# Patient Record
Sex: Male | Born: 1935 | Race: White | Hispanic: No | Smoking: Former smoker
Health system: Southern US, Community
[De-identification: ages and names within clinical notes are randomized; demographics above are authoritative.]

## PROBLEM LIST (undated history)

## (undated) DIAGNOSIS — L97519 Non-pressure chronic ulcer of other part of right foot with unspecified severity: Secondary | ICD-10-CM

## (undated) DIAGNOSIS — I10 Essential (primary) hypertension: Secondary | ICD-10-CM

## (undated) DIAGNOSIS — E119 Type 2 diabetes mellitus without complications: Secondary | ICD-10-CM

---

## 1965-12-29 HISTORY — PX: LEG AMPUTATION BELOW KNEE: SHX694

## 2009-01-26 ENCOUNTER — Ambulatory Visit: Payer: Self-pay

## 2010-05-09 ENCOUNTER — Ambulatory Visit: Payer: Self-pay | Admitting: Family Medicine

## 2012-10-18 ENCOUNTER — Ambulatory Visit: Payer: Self-pay | Admitting: Vascular Surgery

## 2012-10-18 LAB — BASIC METABOLIC PANEL
Anion Gap: 8 (ref 7–16)
BUN: 10 mg/dL (ref 7–18)
Chloride: 104 mmol/L (ref 98–107)
Creatinine: 0.86 mg/dL (ref 0.60–1.30)
EGFR (Non-African Amer.): >60
Glucose: 121 mg/dL — ABNORMAL HIGH (ref 65–99)
Osmolality: 282 (ref 275–301)
Potassium: 4 mmol/L (ref 3.5–5.1)

## 2013-07-04 ENCOUNTER — Ambulatory Visit: Payer: Self-pay | Admitting: Vascular Surgery

## 2013-07-04 LAB — BASIC METABOLIC PANEL
Anion Gap: 8 (ref 7–16)
Calcium, Total: 9 mg/dL (ref 8.5–10.1)
Osmolality: 277 (ref 275–301)
Potassium: 4 mmol/L (ref 3.5–5.1)
Sodium: 139 mmol/L (ref 136–145)

## 2014-07-28 ENCOUNTER — Encounter: Payer: Self-pay | Admitting: Surgery

## 2015-04-17 NOTE — Op Note (Signed)
PATIENT NAME:  Jim [name] MR#:  [id number] DATE OF BIRTH:  1930-08-29  DATE OF PROCEDURE:  10/18/2012  PREOPERATIVE DIAGNOSES:  1. Peripheral artery disease with ulceration of right lower extremity.  2. Long-standing diabetes mellitus with circulatory disorders.  3. Status post left below-knee amputation.   POSTOPERATIVE DIAGNOSES:  1. Peripheral artery disease with ulceration of right lower extremity.  2. Long-standing diabetes mellitus with circulatory disorders.  3. Status post left below-knee amputation.   PROCEDURES:  1. Ultrasound guidance for vascular access, left femoral artery.  2. Catheter placement into right posterior tibial artery from left femoral approach.  3. Aortogram and selective right lower extremity angiogram.  4. Percutaneous transluminal angioplasty to right SFA and above-knee popliteal artery with 5 mm diameter angioplasty balloon.  5. Self-expanding stent placement of right superficial femoral artery and above-knee popliteal artery with 6 mm diameter self-expanding stent for greater than 50% residual stenosis and dissection after angioplasty.  6. StarClose closure device, left femoral artery.   SURGEON: Annice Needy, M.D.   ANESTHESIA: Local with moderate conscious sedation.   ESTIMATED BLOOD LOSS: Minimal.   FLUOROSCOPY TIME: Approximately 17 minutes.  CONTRAST USED: 110 mL.   INDICATION FOR PROCEDURE: The patient is a 81 year old white male who was seen in the office last week with nonhealing ulcerations. He had no palpable pedal pulses and angiogram is performed for evaluation of circulation and potential for revascularization. Risks and benefits were discussed and informed consent was obtained.   DESCRIPTION OF PROCEDURE: The patient was brought to the vascular interventional radiology suite, groins were shaved and prepped and a sterile surgical field created. Due to poor anatomic landmarks, the left femoral artery was accessed under direct ultrasound  guidance without difficulty with a Seldinger needle. J-wire and 5 French sheath were placed. Pigtail catheter was placed in the aorta at the L1 level and AP aortogram was performed. This showed no flow-limiting stenosis in the aortoiliac segments and patent single renal arteries bilaterally. The takeoff of the left renal artery was quite low, several centimeters below the right.  I then hooked the aortic bifurcation and advanced to the right femoral head and selective right lower extremity angiogram was then performed. This showed an occlusion of the SFA in its proximal segments. This reconstituted a diseased distal SFA and above-knee popliteal artery. There was also a high-grade stenosis just above the knee joint, in the popliteal artery. Initially distal tibial opacification was poorly seen. I then gave 4000 units of intravenous heparin for systemic anticoagulation. A 6 French Ansel sheath was placed over a Terumo Advantage wire. I was able to cross the occlusion with a moderate amount of difficulty using initially a Terumo Advantage wire and a Kumpe catheter and later exchanging for an 0.018 Glidewire and a small CXI catheter. I confirmed intraluminal flow in the posterior tibial artery distally and then exchanged for a 300 length V18 wire. A 5 mm diameter x 22 centimeter in length angioplasty balloon was inflated from just below the knee to the proximal superficial femoral artery. Multiple areas of wastes were taken which resolved. Completion angiogram still showed high-grade stenosis and dissection in the mid SFA. We exchanged for the 0.035 wire and used a 6 mm diameter x 27 centimeter in length self-expanding stent, ironed out with a 6 mm balloon. Wastes were taken and completion angiogram now showed the SFA to be patent, the popliteal to be patent, and there was some mild residual stenosis in the most densely calcified portion of  the SFA, at the level of the stent, but this was not flow limiting and the  peroneal was the dominant runoff distally. The posterior tibial artery did occlude in the mid segment and reconstitute in the foot through peroneal collaterals. At this point, I elected to terminate the procedure. The sheath was          pulled back to the ipsilateral external iliac artery and oblique arteriogram was performed and StarClose closure device was deployed in the usual fashion with excellent hemostatic result. The patient tolerated the procedure well and was taken to the recovery room in stable condition.  ____________________________ Annice NeedyJason S. Dew, MD jsd:slb D: 10/18/2012 11:32:13 ET T: 10/18/2012 11:41:34 ET JOB#: [id number]  cc: Annice NeedyJason S. Dew, MD, <Dictator> [facility] Annice NeedyJASON S DEW MD ELECTRONICALLY SIGNED 10/18/2012 13:10

## 2015-04-20 NOTE — Op Note (Signed)
PATIENT NAME:  Jim [name], Jim [name] MR#:  [id number] DATE OF BIRTH:  Apr 14, 1930  DATE OF OPERATION:  07/04/2013  PREOPERATIVE DIAGNOSES: 1.  Peripheral arterial disease with ulceration, right lower extremity.  2.  Status post left lower extremity amputation.  3.  Diabetes.  4.  Hypertension.  5.  Hyperlipidemia.   POSTOPERATIVE DIAGNOSES:  1.  Peripheral arterial disease with ulceration, right lower extremity.  2.  Status post left lower extremity amputation.  3.  Diabetes.  4.  Hypertension.  5.  Hyperlipidemia.   PROCEDURES:  1.  Ultrasound guidance for vascular access, left femoral artery.  2.  Catheter placement to right popliteal artery from left femoral approach.  3.  Aortogram and selective right lower extremity angiogram.  4. Percutaneous transluminal angioplasty of right superficial femoral artery stent for recurrent stenosis with 5 mm and 6 mm diameter angioplasty balloon.  5.  Percutaneous transluminal angioplasty of right popliteal stenosis for separate and distinct lesion with 5 mm and 6 mm diameter angioplasty balloon.  6.  Self-expanding stent placement to right popliteal artery for greater than 50% residual stenosis after angioplasty.  7.  StarClose closure device, left femoral artery.   SURGEON:  Dew.   ANESTHESIA: Local, with moderate conscious sedation.   BLOOD LOSS: 25 mL.   INDICATION FOR PROCEDURE: An 81 year old white male with an extensive vascular history and multiple medical issues. He has a nonhealing ulceration of the right lower extremity. His noninvasive studies recently showed diminished perfusion with recurrent stenosis in his right SFA stent as well as a stenosis in the right popliteal artery that was separate. He was brought in for angiography, further evaluation and treatment. Risks and benefits were discussed. Informed consent was obtained.   DESCRIPTION OF PROCEDURE: The patient was brought to the vascular interventional radiology suite. Groins were  shaved and prepped and a sterile surgical field was created. Ultrasound was used to visualize a patent left femoral artery which was accessed under direct ultrasound guidance without difficulty with a Seldinger needle. A J-wire and 5 French sheath were placed. Pigtail catheter was placed in the aorta at the L1 level. AP aortogram was performed. This showed patent renal arteries bilaterally, with the left being much lower than the right. I then took care of the bifurcation and advanced to the right femoral head, and selective right lower extremity angiogram was then performed. This demonstrated a patent common femoral artery, profunda femoris artery. The superficial femoral artery was patent proximally. There was a right SFA stent that had a moderate to high-grade stenosis in the 70% to 75% range over several centimeters in the mid- to distal portions. From Hunter's canal to the popliteal artery just above the knee was patent, there was a near-occlusive stenosis of 95% or more in the popliteal artery just above the knee. The below-knee popliteal artery was patent, without significant stenosis, and there was 1-vessel runoff distally through the peroneal artery. I then placed a Terumo Advantage wire through a 6-French Ansell sheath, heparinized the patient.  I was able to cross these lesion without difficulty with the Terumo Advantage wire and a Kumpe catheter and confirm intraluminal flow to the below-knee popliteal artery.   I then replaced the wire. Balloon angioplasty was performed initially with a 5 mm diameter angioplasty balloon to the popliteal lesion and then a 5 mm angioplasty balloon to the SFA stents for separate and distinct lesions. There was still residual stenosis after both treatments. I then upsized to a 6 mm diameter angioplasty  balloon initially in both the popliteal artery and in the SFA, again separate and distinct and waists were taken. Following this, the SFA stent was now patent, with less  than 20% residual stenosis, however the popliteal artery still had a 70% stenosis residual, and I elected to place a small self-expanding stent a 6 mm in diameter x 4 cm self-expanding stent was placed just above the knee in the popliteal artery, and was postdilated. Completion angiogram following this showed this now to be widely patent, without significant residual stenosis.   At this point I elected to terminate the procedure. The sheath was pulled back to the ipsilateral external iliac artery and oblique arteriogram was performed. StarClose closure device was deployed in the usual fashion with excellent hemostatic result. The patient tolerated the procedure well and was taken to the recovery room in stable condition.    ____________________________ Annice NeedyJason S. Dew, MD jsd:dm D: 07/04/2013 09:10:03 ET T: 07/04/2013 09:34:50 ET JOB#: [id number]  cc: Annice NeedyJason S. Dew, MD, <Dictator> Annice NeedyJASON S DEW MD ELECTRONICALLY SIGNED 07/04/2013 13:20

## 2015-08-30 ENCOUNTER — Encounter: Payer: Medicare HMO | Attending: Surgery | Admitting: Surgery

## 2015-08-30 DIAGNOSIS — I10 Essential (primary) hypertension: Secondary | ICD-10-CM | POA: Diagnosis not present

## 2015-08-30 DIAGNOSIS — M1990 Unspecified osteoarthritis, unspecified site: Secondary | ICD-10-CM | POA: Diagnosis not present

## 2015-08-30 DIAGNOSIS — I7025 Atherosclerosis of native arteries of other extremities with ulceration: Secondary | ICD-10-CM | POA: Diagnosis not present

## 2015-08-30 DIAGNOSIS — Z89512 Acquired absence of left leg below knee: Secondary | ICD-10-CM | POA: Insufficient documentation

## 2015-08-30 DIAGNOSIS — L97512 Non-pressure chronic ulcer of other part of right foot with fat layer exposed: Secondary | ICD-10-CM | POA: Diagnosis not present

## 2015-08-30 DIAGNOSIS — E11621 Type 2 diabetes mellitus with foot ulcer: Secondary | ICD-10-CM | POA: Diagnosis not present

## 2015-08-30 DIAGNOSIS — Z87891 Personal history of nicotine dependence: Secondary | ICD-10-CM | POA: Insufficient documentation

## 2015-08-30 DIAGNOSIS — Z794 Long term (current) use of insulin: Secondary | ICD-10-CM | POA: Insufficient documentation

## 2015-08-30 NOTE — Progress Notes (Signed)
Lafever, Westlee L. ([id number]) Visit Report for 08/30/2015 Abuse/Suicide Risk Screen Details Patient Name: Jim [name], [name] Date of Service: 08/30/2015 1:45 PM Medical Record Number: [id number] Patient Account Number: [id number] Date of Birth/Sex: May 25, 1930 (81 y.o. Male) Treating RN: Clover Mealy, RN, BSN, [place] S[place] Primary Care Physician: Darreld Mclean Other Clinician: Referring Physician: Darreld Mclean Treating Physician/Extender: Rudene Re in Treatment: 0 Abuse/Suicide Risk Screen Items Answer ABUSE/SUICIDE RISK SCREEN: Has anyone close to you tried to hurt or harm you recentlyo No Do you feel uncomfortable with anyone in your familyo No Has anyone forced you do things that you didnot want to doo No Do you have any thoughts of harming yourselfo No Patient displays signs or symptoms of abuse and/or neglect. No Electronic Signature(s) Signed: 08/30/2015 2:07:10 PM By: Elpidio Eric BSN, RN Entered By: Elpidio Eric on 08/30/2015 14:07:10 Jim [name], [name] ([id number]) -------------------------------------------------------------------------------- Activities of Daily Living Details Patient Name: [name], [name] Date of Service: 08/30/2015 1:45 PM Medical Record Number: [id number] Patient Account Number: [id number] Date of Birth/Sex: 1930/05/17 (81 y.o. Male) Treating RN: Clover Mealy, RN, BSN, [place] Sink Primary Care Physician: Darreld Mclean Other Clinician: Referring Physician: Darreld Mclean Treating Physician/Extender: Rudene Re in Treatment: 0 Activities of Daily Living Items Answer Activities of Daily Living (Please select one for each item) Drive Automobile Not Able Take Medications Need Assistance Use Telephone Completely Able Care for Appearance Need Assistance Use Toilet Need Assistance Bath / Shower Need Assistance Dress Self Need Assistance Feed Self Completely Able Walk Need Assistance Get In / Out Bed Need Assistance Housework Need Assistance Prepare Meals Need Assistance Handle  Money Need Assistance Shop for Self Need Assistance Electronic Signature(s) Signed: 08/30/2015 2:06:59 PM By: Elpidio Eric BSN, RN Entered By: Elpidio Eric on 08/30/2015 14:06:58 Jim [name], [name] ([id number]) -------------------------------------------------------------------------------- Education Assessment Details Patient Name: Jim [name]. Date of Service: 08/30/2015 1:45 PM Medical Record Number: [id number] Patient Account Number: [id number] Date of Birth/Sex: [birth year] (81 y.o. Male) Treating RN: Clover Mealy, RN, BSN, [place] Sink Primary Care Physician: Darreld Mclean Other Clinician: Referring Physician: Darreld Mclean Treating Physician/Extender: Rudene Re in Treatment: 0 Primary Learner Assessed: Patient Learning Preferences/Education Level/Primary Language Learning Preference: Explanation Highest Education Level: Grade School Preferred Language: English Cognitive Barrier Assessment/Beliefs Language Barrier: No Physical Barrier Assessment Impaired Vision: Yes Glasses Impaired Hearing: No Decreased Hand dexterity: No Knowledge/Comprehension Assessment Knowledge Level: High Comprehension Level: High Ability to understand written High instructions: Motivation Assessment Anxiety Level: Calm Cooperation: Cooperative Education Importance: Acknowledges Need Interest in Health Problems: Asks Questions Perception: Coherent Willingness to Engage in Self- Medium Management Activities: Readiness to Engage in Self- Medium Management Activities: Electronic Signature(s) Signed: 08/30/2015 2:06:23 PM By: Elpidio Eric BSN, RN Entered By: Elpidio Eric on 08/30/2015 14:06:23 Jim [name], [name] ([id number]) -------------------------------------------------------------------------------- Fall Risk Assessment Details Patient Name: Jim [name]. Date of Service: 08/30/2015 1:45 PM Medical Record Number: [id number] Patient Account Number: [id number] Date of Birth/Sex: [birth year] (81 y.o.  Male) Treating RN: Clover Mealy, RN, BSN, [place] Sink Primary Care Physician: Darreld Mclean Other Clinician: Referring Physician: Darreld Mclean Treating Physician/Extender: Rudene Re in Treatment: 0 Fall Risk Assessment Items FALL RISK ASSESSMENT: History of falling - immediate or within 3 months 0 No Secondary diagnosis 0 No Ambulatory aid None/bed rest/wheelchair/nurse 0 Yes Crutches/cane/walker 15 Yes Furniture 0 No IV Access/Saline Lock 0 No Gait/Training Normal/bed rest/immobile 0 No Weak 0 No Impaired 20 Yes Mental Status Oriented to own ability 0 Yes Electronic Signature(s) Signed: 08/30/2015 2:05:54 PM By: Elpidio Eric BSN, RN Entered By: Elpidio Eric on  08/30/2015 14:05:54 Jim [name], [name] ([id number]) -------------------------------------------------------------------------------- Foot Assessment Details Patient Name: Jim [name], [name] Date of Service: 08/30/2015 1:45 PM Medical Record Number: [id number] Patient Account Number: [id number] Date of Birth/Sex: [birth year] (81 y.o. Male) Treating RN: Clover Mealy, RN, BSN, [place] Primary Care Physician: Darreld Mclean Other Clinician: Referring Physician: Darreld Mclean Treating Physician/Extender: Rudene Re in Treatment: 0 Foot Assessment Items Site Locations + = Sensation present, - = Sensation absent, C = Callus, U = Ulcer R = Redness, W = Warmth, M = Maceration, PU = Pre-ulcerative lesion F = Fissure, S = Swelling, D = Dryness Assessment Right: Left: Other Deformity: No No Prior Foot Ulcer: No No Prior Amputation: No No Charcot Joint: No No Ambulatory Status: Ambulatory With Help Assistance Device: Wheelchair Gait: Surveyor, mining) Signed: 08/30/2015 2:05:10 PM By: Elpidio Eric BSN, RN Entered By: Elpidio Eric on 08/30/2015 14:05:10 Jim [name], [name] ([id number]) -------------------------------------------------------------------------------- Nutrition Risk Assessment Details Patient Name: Jim [name], [name] Date  of Service: 08/30/2015 1:45 PM Medical Record Number: [id number] Patient Account Number: [id number] Date of Birth/Sex: July 29, 1930 (81 y.o. Male) Treating RN: Clover Mealy, RN, BSN, [place] Primary Care Physician: Darreld Mclean Other Clinician: Referring Physician: Darreld Mclean Treating Physician/Extender: Rudene Re in Treatment: 0 Height (in): 73 Weight (lbs): 222 Body Mass Index (BMI): 29.3 Nutrition Risk Assessment Items NUTRITION RISK SCREEN: I have an illness or condition that made me change the kind and/or 0 No amount of food I eat I eat fewer than two meals per day 0 No I eat few fruits and vegetables, or milk products 0 No I have three or more drinks of beer, liquor or wine almost every day 0 No I have tooth or mouth problems that make it hard for me to eat 0 No I don't always have enough money to buy the food I need 0 No I eat alone most of the time 0 No I take three or more different prescribed or over-the-counter drugs a 0 No day Without wanting to, I have lost or gained 10 pounds in the last six 0 No months I am not always physically able to shop, cook and/or feed myself 2 Yes Nutrition Protocols Good Risk Protocol 0 No interventions needed Moderate Risk Protocol Electronic Signature(s) Signed: 08/30/2015 2:05:33 PM By: Elpidio Eric BSN, RN Entered By: Elpidio Eric on 08/30/2015 14:05:32

## 2015-08-30 NOTE — Progress Notes (Addendum)
Hopes, [name] ([id number]) Visit Report for 08/30/2015 Chief Complaint Document Details Patient Name: Jim [name], [name] Date of Service: 08/30/2015 1:45 PM Medical Record Number: [id number] Patient Account Number: [id number] Date of Birth/Sex: Jun 28, 1930 (81 y.o. Male) Treating RN: Clover Mealy, RN, BSN, [place] Sink Primary Care Physician: Darreld Mclean Other Clinician: Referring Physician: Darreld Mclean Treating Physician/Extender: Jim [name] in Treatment: 0 Information Obtained from: Patient Chief Complaint Patients presents for treatment of an open diabetic ulcer. The patient noticed some blood on his sock and noticed that he had a ulcerated area on his right forefoot and his right fourth toe. This was about 3 weeks ago. Electronic Signature(s) Signed: 08/30/2015 3:07:47 PM By: Evlyn Kanner MD, FACS Entered By: Evlyn Kanner on 08/30/2015 15:07:46 Jim [name], [name] ([id number]) -------------------------------------------------------------------------------- Debridement Details Patient Name: Jim [name], [name] Date of Service: 08/30/2015 1:45 PM Medical Record Number: [id number] Patient Account Number: [id number] Date of Birth/Sex: 03-03-1930 (81 y.o. Male) Treating RN: Afful, RN, BSN, Rita Primary Care Physician: Darreld Mclean Other Clinician: Referring Physician: Darreld Mclean Treating Physician/Extender: Jim [name] in Treatment: 0 Debridement Performed for Wound #2 Right,Lateral,Plantar Foot Assessment: Performed By: Physician Tristan Schroeder., MD Debridement: Debridement Pre-procedure Yes Verification/Time Out Taken: Start Time: 14:44 Pain Control: Lidocaine 4% Topical Solution Level: Skin/Subcutaneous Tissue Total Area Debrided (L x 1 (cm) x 1.2 (cm) = 1.2 (cm) W): Tissue and other Viable, Non-Viable, Exudate, Fibrin/Slough, Subcutaneous material debrided: Instrument: Curette Bleeding: Minimum Hemostasis Achieved: Pressure End Time: 14:46 Procedural Pain: 0 Post  Procedural Pain: 0 Response to Treatment: Procedure was tolerated well Post Debridement Measurements of Total Wound Length: (cm) 1 Width: (cm) 1.2 Depth: (cm) 0.2 Volume: (cm) 0.188 Post Procedure Diagnosis Same as Pre-procedure Electronic Signature(s) Signed: 08/30/2015 3:06:31 PM By: Evlyn Kanner MD, FACS Signed: 08/31/2015 9:31:14 AM By: Elpidio Eric BSN, RN Previous Signature: 08/30/2015 2:47:15 PM Version By: Elpidio Eric BSN, RN Entered By: Evlyn Kanner on 08/30/2015 15:06:31 Deridder, Shown L. ([id number]) -------------------------------------------------------------------------------- HPI Details Patient Name: Jim [name], [name] Date of Service: 08/30/2015 1:45 PM Medical Record Number: [id number] Patient Account Number: [id number] Date of Birth/Sex: 01-28-1930 (81 y.o. Male) Treating RN: Clover Mealy, RN, BSN, Rita Primary Care Physician: Darreld Mclean Other Clinician: Referring Physician: Darreld Mclean Treating Physician/Extender: Jim [name] in Treatment: 0 History of Present Illness Location: ulcerated area on the right foot plantar aspect and his right fourth toe Quality: Patient reports No Pain. Severity: Patient states wound are getting worse. Duration: Patient has had the wound for < 3 weeks prior to presenting for treatment Context: The wound appeared gradually over time Modifying Factors: Other treatment(s) tried include:the patient has been put on oral Levaquin for 10 days Associated Signs and Symptoms: Patient reports having increase swelling. HPI Description: The patient is a pleasant 81 year old with a past medical history significant for type 2 diabetes and peripheral vascular disease. He underwent a left below-the-knee amputation approximately 10 years ago after stepping on a nail. He has done well since that time and is able to ambulate with a prosthetic. he has had a vascular procedure on his right leg about 4-5 years ago and has never seen the doctor again. His  diabetes is under control and his last hemoglobin A1c was 7.7% measured in January 2016. His other comorbidities are hypertension, cellulitis of the foot, recent urinary tract infection. He was seen earlier for a wound problem about a year ago on his BKA stump. He has been referred back to the wound center by his PCP Dr. Darreld Mclean for  a moderately severe cellulitis of the right foot and right fourth toenail in a gentleman who is known to have PVD and is status post left BKA and has insulin-dependent diabetes mellitus. Electronic Signature(s) Signed: 08/30/2015 3:10:57 PM By: Evlyn Kanner MD, FACS Previous Signature: 08/30/2015 1:56:47 PM Version By: Evlyn Kanner MD, FACS Entered By: Evlyn Kanner on 08/30/2015 15:10:56 Jim [name], [name] ([id number]) -------------------------------------------------------------------------------- Callus Pairing Details Patient Name: Jim [name], [name] Date of Service: 08/30/2015 1:45 PM Medical Record Number: [id number] Patient Account Number: [id number] Date of Birth/Sex: 06/26/1930 (81 y.o. Male) Treating RN: Clover Mealy, RN, BSN, [place] Sink Primary Care Physician: Darreld Mclean Other Clinician: Referring Physician: Darreld Mclean Treating Physician/Extender: Jim [name] in Treatment: 0 Procedure Performed for: Wound #2 Right,Lateral,Plantar Foot Performed By: Physician Tristan Schroeder., MD Post Procedure Diagnosis Same as Pre-procedure Notes He had a significant callus surrounding the ulceration and a lot of dead skin which was sharply debrided with forceps and scissors. Electronic Signature(s) Signed: 08/30/2015 3:06:59 PM By: Evlyn Kanner MD, FACS Previous Signature: 08/30/2015 2:47:31 PM Version By: Elpidio Eric BSN, RN Entered By: Evlyn Kanner on 08/30/2015 15:06:59 Jim [name], [name] ([id number]) -------------------------------------------------------------------------------- Physical Exam Details Patient Name: Jim [name], [name] Date of Service: 08/30/2015 1:45  PM Medical Record Number: [id number] Patient Account Number: [id number] Date of Birth/Sex: Aug 14, 1930 (81 y.o. Male) Treating RN: Clover Mealy, RN, BSN, [place] Sink Primary Care Physician: Darreld Mclean Other Clinician: Referring Physician: Darreld Mclean Treating Physician/Extender: Jim [name] in Treatment: 0 Constitutional . Pulse regular. Respirations normal and unlabored. Afebrile. . Eyes Nonicteric. Reactive to light. Ears, Nose, Mouth, and Throat Lips, teeth, and gums WNL.Marland Kitchen Moist mucosa without lesions . Neck supple and nontender. No palpable supraclavicular or cervical adenopathy. Normal sized without goiter. Respiratory WNL. No retractions.. Cardiovascular Heart rhythm and rate regular, no murmur or gallop.. the pulses on his right foot were barely palpable and his ABI was 0.76 and monophasic.Marland Kitchen No clubbing, cyanosis or edema. Gastrointestinal (GI) Abdomen without masses or tenderness.. No liver or spleen enlargement or tenderness.. Lymphatic No adneopathy. No adenopathy. No adenopathy. Musculoskeletal Adexa without tenderness or enlargement.. Digits and nails w/o clubbing, cyanosis, infection, petechiae, ischemia, or inflammatory conditions.. Integumentary (Hair, Skin) No suspicious lesions. No crepitus or fluctuance. No peri-wound warmth or erythema. No masses.Marland Kitchen Psychiatric Judgement and insight Intact.. No evidence of depression, anxiety, or agitation.. Notes The right foot has a lot of dry scaly skin and the dorsum of his right fourth toe has a mild ulceration after an avulsion of the nail. The lateral part of his right forefoot has a significant callus with a lot of dead skin and a ulcerated area which needs sharp debridement with forceps scissors and a curette. Electronic Signature(s) Signed: 08/30/2015 3:12:54 PM By: Evlyn Kanner MD, FACS Entered By: Evlyn Kanner on 08/30/2015 15:12:54 Jim [name], Terrill L.  ([id number]) -------------------------------------------------------------------------------- Physician Orders Details Patient Name: Jim Pilgrim. Date of Service: 08/30/2015 1:45 PM Medical Record Number: [id number] Patient Account Number: [id number] Date of Birth/Sex: 06/28/1930 (82 y.o. 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 Wound #3 Right,Dorsal Toe  Fourth o Clean wound with Normal Saline. Anesthetic Wound #2 Right,Lateral,Plantar Foot o Topical Lidocaine 4% cream applied to wound bed prior to debridement Wound #3 Right,Dorsal Toe Fourth o Topical Lidocaine 4% cream applied to wound bed prior to debridement Primary Wound Dressing Wound #2 Right,Lateral,Plantar Foot o Aquacel Ag Wound #3 Right,Dorsal Toe Fourth o Aquacel Ag Secondary Dressing Wound #2 Right,Lateral,Plantar Foot o Gauze and Kerlix/Conform o Foam Wound #3 Right,Dorsal Toe Fourth o Gauze and Kerlix/Conform o Foam Jim [name], [name] ([id number]) Dressing Change Frequency Wound #2 Right,Lateral,Plantar Foot o Change dressing every other day. Wound #3 Right,Dorsal Toe Fourth o Change dressing every other day. Follow-up Appointments Wound #2 Right,Lateral,Plantar Foot o Return Appointment in 1 week. Wound #3 Right,Dorsal Toe Fourth o Return Appointment in 1 week. Off-Loading Wound #2 Right,Lateral,Plantar Foot o Open toe surgical  shoe with peg assist. - darco o Other: - felt Wound #3 Right,Dorsal Toe Fourth o Open toe surgical shoe with peg assist. - darco o Other: - felt Additional Orders / Instructions Wound #2 Right,Lateral,Plantar Foot o Increase protein intake. o Activity as tolerated Wound #3 Right,Dorsal Toe Fourth o Increase protein intake. o Activity as tolerated Notes Patient to make appointment with vascular for follow up. Electronic Signature(s) Signed: 08/30/2015 2:51:22 PM By: Elpidio Eric BSN, RN Signed: 08/30/2015 4:57:18 PM By: Evlyn Kanner MD, FACS Entered By: Elpidio Eric on 08/30/2015 14:51:22 Jim [name], [name] ([id number]) -------------------------------------------------------------------------------- Problem List Details Patient Name: Jim [name], Philopateer L. Date of Service: 08/30/2015 1:45 PM Medical Record Number: [id number] Patient Account Number: [id number] Date of Birth/Sex: 1930/12/28 (81 y.o. Male) Treating RN: Clover Mealy, RN, BSN, [place] Sink Primary Care Physician: Darreld Mclean Other Clinician: Referring Physician: Darreld Mclean Treating Physician/Extender: Jim [name] in Treatment: 0 Active Problems ICD-10 Encounter Code Description Active Date Diagnosis E11.621 Type 2 diabetes mellitus with foot ulcer 08/30/2015 Yes Z89.512 Acquired absence of left leg below knee 08/30/2015 Yes I70.25 Atherosclerosis of native arteries of other extremities with 08/30/2015 Yes ulceration L97.512 Non-pressure chronic ulcer of other part of right foot with 08/30/2015 Yes fat layer exposed Inactive Problems Resolved Problems Electronic Signature(s) Signed: 08/30/2015 3:06:14 PM By: Evlyn Kanner MD, FACS Previous Signature: 08/30/2015 2:19:38 PM Version By: Evlyn Kanner MD, FACS Entered By: Evlyn Kanner on 08/30/2015 15:06:13 Jim [name], [name] ([id number]) -------------------------------------------------------------------------------- Progress Note/History and Physical Details Patient Name: Jim Pilgrim. Date of Service: 08/30/2015 1:45 PM Medical Record Number: [id number] Patient Account Number: [id number] Date of Birth/Sex: 1930/07/30 (81 y.o. Male) Treating RN: Clover Mealy, RN, BSN, Rita Primary Care Physician: Darreld Mclean Other Clinician: Referring Physician: Darreld Mclean Treating Physician/Extender: Jim [name] in Treatment: 0 Subjective Chief Complaint Information obtained from Patient Patients presents for treatment of an open diabetic ulcer. The patient noticed some blood on his sock and noticed that he had a ulcerated area on his right forefoot and his right fourth toe. This was about 3 weeks ago. History of Present Illness (HPI) The following HPI elements were documented for the patient's wound: Location: ulcerated area on the right foot plantar aspect and his right fourth toe Quality: Patient reports No Pain. Severity: Patient states wound are getting worse. Duration: Patient has had the wound for < 3 weeks prior to presenting for treatment Context: The wound appeared gradually over time Modifying Factors: Other treatment(s) tried include:the patient has been put on oral Levaquin for 10 days Associated Signs and Symptoms: Patient reports having increase swelling. The patient is a pleasant 81 year old with a past medical history significant for type 2 diabetes  and peripheral vascular disease. He underwent a left below-the-knee amputation approximately 10 years ago after stepping on a nail. He has done well since that time and is able to ambulate with a prosthetic. he has had a vascular procedure on his right leg about 4-5 years ago and has never seen the doctor again. His diabetes is under control and his last hemoglobin A1c was 7.7% measured in January 2016. His other comorbidities are hypertension, cellulitis of the foot, recent urinary tract infection. He was seen earlier for a wound problem about a year ago on his BKA stump. He has been referred back to the  wound center by his PCP Dr. Darreld Mclean for a moderately severe cellulitis of the right foot and right fourth toenail in a gentleman who is known to have PVD and is status post left BKA and has insulin-dependent diabetes mellitus. Wound History Patient presents with 2 open wounds that have been present for approximately 3weeks. Patient has been treating wounds in the following manner: dry dressing. Laboratory tests have not been performed in the last month. Patient reportedly has not tested positive for an antibiotic resistant organism. Patient reportedly has not tested positive for osteomyelitis. Patient reportedly has not had testing performed to evaluate circulation in the legs. Patient experiences the following problems associated with their wounds: infection. Patient History Information obtained from Patient. Barsanti, Marlo L. ([id number]) Allergies No Known Drug Allergies Family History Diabetes - Mother, No family history of Cancer, Heart Disease, Hypertension, Kidney Disease, Lung Disease, Seizures, Stroke, Thyroid Problems. Social History Former smoker - chewed tobacco, quit 59yrs ago, Marital Status - Married, Alcohol Use - Never, Drug Use - No History, Caffeine Use - Daily. Medical History Eyes Patient has history of Cataracts - removed Ear/Nose/Mouth/Throat Denies history of Chronic sinus problems/congestion, Middle ear problems Hematologic/Lymphatic Denies history of Anemia, Hemophilia, Human Immunodeficiency Virus, Lymphedema, Sickle Cell Disease Respiratory Denies history of Aspiration, Asthma, Chronic Obstructive Pulmonary Disease (COPD), Pneumothorax, Sleep Apnea, Tuberculosis Cardiovascular Patient has history of Hypertension Denies history of Angina, Arrhythmia, Congestive Heart Failure, Coronary Artery Disease, Deep Vein Thrombosis, Hypotension, Myocardial Infarction, Peripheral Arterial Disease, Peripheral Venous Disease, Phlebitis,  Vasculitis Gastrointestinal Denies history of Cirrhosis Endocrine Patient has history of Type II Diabetes Genitourinary Denies history of End Stage Renal Disease Immunological Denies history of Lupus Erythematosus, Raynaud s, Scleroderma Integumentary (Skin) Denies history of History of Burn, History of pressure wounds Musculoskeletal Patient has history of Osteoarthritis Denies history of Gout, Rheumatoid Arthritis Neurologic Denies history of Neuropathy Oncologic Denies history of Received Chemotherapy, Received Radiation Patient is treated with Insulin. Blood sugar is tested. Blood sugar results noted at the following times: Breakfast - 150, Bedtime - 180. Medical And Surgical History Notes Constitutional Symptoms (General Health) Childrey, Ovide L. ([id number]) HTN, Diabetes, Left BKA 2000, Dry Skin Review of Systems (ROS) Eyes Complains or has symptoms of Glasses / Contacts. Ear/Nose/Mouth/Throat The patient has no complaints or symptoms. Hematologic/Lymphatic The patient has no complaints or symptoms. Respiratory The patient has no complaints or symptoms. Cardiovascular The patient has no complaints or symptoms. Gastrointestinal The patient has no complaints or symptoms. Genitourinary The patient has no complaints or symptoms. Immunological The patient has no complaints or symptoms. Integumentary (Skin) Complains or has symptoms of Wounds, Swelling. Musculoskeletal The patient has no complaints or symptoms, left BKA Neurologic The patient has no complaints or symptoms. Medications aspirin oral unspecified oral oxycodone 30 mg tablet oral tablet oral for as needed simvastatin 40 mg tablet oral 1 1 tablet oral  levofloxacin 500 mg tablet oral 1 1 tablet oral the medication should also include Humulin insulin, Univasc, pro-air HFA, gabapentin. Objective Constitutional Pulse regular. Respirations normal and unlabored. Afebrile. Vitals Time Taken: 2:03 PM, Height:  73 in, Source: Stated, Weight: 222 lbs, Source: Stated, BMI: 29.3, Temperature: 98.4 F, Pulse: 88 bpm, Respiratory Rate: 16 breaths/min, Blood Pressure: 152/67 mmHg. Wahlstrom, Rinaldo L. ([id number]) Eyes Nonicteric. Reactive to light. Ears, Nose, Mouth, and Throat Lips, teeth, and gums WNL.Marland Kitchen Moist mucosa without lesions . Neck supple and nontender. No palpable supraclavicular or cervical adenopathy. Normal sized without goiter. Respiratory WNL. No retractions.. Cardiovascular Heart rhythm and rate regular, no murmur or gallop.. the pulses on his right foot were barely palpable and his ABI was 0.76 and monophasic.Marland Kitchen No clubbing, cyanosis or edema. Gastrointestinal (GI) Abdomen without masses or tenderness.. No liver or spleen enlargement or tenderness.. Lymphatic No adneopathy. No adenopathy. No adenopathy. Musculoskeletal Adexa without tenderness or enlargement.. Digits and nails w/o clubbing, cyanosis, infection, petechiae, ischemia, or inflammatory conditions.Marland Kitchen Psychiatric Judgement and insight Intact.. No evidence of depression, anxiety, or agitation.. General Notes: The right foot has a lot of dry scaly skin and the dorsum of his right fourth toe has a mild ulceration after an avulsion of the nail. The lateral part of his right forefoot has a significant callus with a lot of dead skin and a ulcerated area which needs sharp debridement with forceps scissors and a curette. Integumentary (Hair, Skin) No suspicious lesions. No crepitus or fluctuance. No peri-wound warmth or erythema. No masses.. Wound #2 status is Open. Original cause of wound was Footwear Injury. The wound is located on the Right,Lateral,Plantar Foot. The wound measures 1cm length x 1.2cm width x 0.2cm depth; 0.942cm^2 area and 0.188cm^3 volume. The wound is limited to skin breakdown. There is no tunneling or undermining noted. There is a small amount of serosanguineous drainage noted. The wound margin is thickened.  There is medium (34-66%) pink, pale granulation within the wound bed. There is a medium (34-66%) amount of necrotic tissue within the wound bed including Adherent Slough. The periwound skin appearance exhibited: Callus, Moist. The periwound skin appearance did not exhibit: Crepitus, Excoriation, Fluctuance, Friable, Induration, Localized Edema, Rash, Scarring, Dry/Scaly, Maceration, Atrophie Blanche, Cyanosis, Ecchymosis, Hemosiderin Staining, Mottled, Pallor, Rubor, Erythema. Periwound temperature was noted as No Abnormality. The periwound has tenderness on palpation. Wound #3 status is Open. Original cause of wound was Shear/Friction. The wound is located on the Right,Dorsal Toe Fourth. The wound measures 1cm length x 1cm width x 0.1cm depth; 0.785cm^2 area Wisniewski, Fidel L. ([id number]) and 0.079cm^3 volume. The wound is limited to skin breakdown. There is no tunneling or undermining noted. There is a small amount of serous drainage noted. The wound margin is distinct with the outline attached to the wound base. There is large (67-100%) red, pink granulation within the wound bed. There is no necrotic tissue within the wound bed. The periwound skin appearance exhibited: Moist. The periwound skin appearance did not exhibit: Callus, Crepitus, Excoriation, Fluctuance, Friable, Induration, Localized Edema, Rash, Scarring, Dry/Scaly, Maceration, Atrophie Blanche, Cyanosis, Ecchymosis, Hemosiderin Staining, Mottled, Pallor, Rubor, Erythema. Periwound temperature was noted as No Abnormality. Assessment Active Problems ICD-10 E11.621 - Type 2 diabetes mellitus with foot ulcer Z89.512 - Acquired absence of left leg below knee I70.25 - Atherosclerosis of native arteries of other extremities with ulceration L97.512 - Non-pressure chronic ulcer of other part of right foot with fat layer exposed This 81 year old gentleman who has diabetes mellitus type 2 with a  DFU on the right plantar aspect of  his forefoot has a Wagner stage II ulceration. He also has a significant history of atherosclerotic disease of the right lower extremity and has not had a vascular workup for several years after his angioplasty. I have recommended he sees the vascular surgeons for a review and possible arterial duplex study. I have recommended silver alginate and a felt offloading ring with a border foam dressing and have recommended a Pegasus shoe because of the fact that he's got a prosthesis on the other leg and will not be able to tolerate a Darco offloading shoe. We have discussed foot hygiene and dressing changes appropriately and close monitoring of his blood glucose. He is also urged to follow-up with his vascular doctor and see as back on a regular basis weekly. Procedures Wound #2 Wound #2 is a Diabetic Wound/Ulcer of the Lower Extremity located on the Right,Lateral,Plantar Foot . There was a Skin/Subcutaneous Tissue Debridement (40981-19147) debridement with total area of 1.2 sq cm performed by Tristan Schroeder., MD. with the following instrument(s): Curette to remove Viable and Non- Viable tissue/material including Exudate, Fibrin/Slough, and Subcutaneous after achieving pain control using Lidocaine 4% Topical Solution. A time out was conducted prior to the start of the procedure. A Minimum amount of bleeding was controlled with Pressure. The procedure was tolerated well with a pain level of 0 throughout and a pain level of 0 following the procedure. Post Debridement Measurements: 1cm length x 1.2cm width x 0.2cm depth; 0.188cm^3 volume. Post procedure Diagnosis Wound #2: Same as Pre-Procedure Hartfield, Halton L. ([id number]) Wound #2 is a Diabetic Wound/Ulcer of the Lower Extremity located on the Right,Lateral,Plantar Foot . An Callus Pairing procedure was performed by Tristan Schroeder., MD. Post procedure Diagnosis Wound #2: Same as Pre-Procedure Notes: He had a significant callus surrounding the  ulceration and a lot of dead skin which was sharply debrided with forceps and scissors. Plan Wound Cleansing: Wound #2 Right,Lateral,Plantar Foot: Clean wound with Normal Saline. Wound #3 Right,Dorsal Toe Fourth: Clean wound with Normal Saline. Anesthetic: Wound #2 Right,Lateral,Plantar Foot: Topical Lidocaine 4% cream applied to wound bed prior to debridement Wound #3 Right,Dorsal Toe Fourth: Topical Lidocaine 4% cream applied to wound bed prior to debridement Primary Wound Dressing: Wound #2 Right,Lateral,Plantar Foot: Aquacel Ag Wound #3 Right,Dorsal Toe Fourth: Aquacel Ag Secondary Dressing: Wound #2 Right,Lateral,Plantar Foot: Gauze and Kerlix/Conform Foam Wound #3 Right,Dorsal Toe Fourth: Gauze and Kerlix/Conform Foam Dressing Change Frequency: Wound #2 Right,Lateral,Plantar Foot: Change dressing every other day. Wound #3 Right,Dorsal Toe Fourth: Change dressing every other day. Follow-up Appointments: Wound #2 Right,Lateral,Plantar Foot: Return Appointment in 1 week. Wound #3 Right,Dorsal Toe Fourth: Return Appointment in 1 week. Off-Loading: Wound #2 Right,Lateral,Plantar Foot: Open toe surgical shoe with peg assist. - darco Other: - felt Pollack, Grabiel L. ([id number]) Wound #3 Right,Dorsal Toe Fourth: Open toe surgical shoe with peg assist. - darco Other: - felt Additional Orders / Instructions: Wound #2 Right,Lateral,Plantar Foot: Increase protein intake. Activity as tolerated Wound #3 Right,Dorsal Toe Fourth: Increase protein intake. Activity as tolerated General Notes: Patient to make appointment with vascular for follow up. This 81 year old gentleman who has diabetes mellitus type 2 with a DFU on the right plantar aspect of his forefoot has a Wagner stage II ulceration. He also has a significant history of atherosclerotic disease of the right lower extremity and has not had a vascular workup for several years after his angioplasty. I have recommended he  sees the vascular surgeons for  a review and possible arterial duplex study. I have recommended silver alginate and a felt offloading ring with a border foam dressing and have recommended a Pegasus shoe because of the fact that he's got a prosthesis on the other leg and will not be able to tolerate a Darco offloading shoe. We have discussed foot hygiene and dressing changes appropriately and close monitoring of his blood glucose. He is also urged to follow-up with his vascular doctor and see as back on a regular basis weekly. Electronic Signature(s) Signed: 08/30/2015 3:17:00 PM By: Evlyn Kanner MD, FACS Entered By: Evlyn Kanner on 08/30/2015 15:17:00 Kaseman, Eino L. ([id number]) -------------------------------------------------------------------------------- ROS/PFSH Details Patient Name: Jim [name], Taray L. Date of Service: 08/30/2015 1:45 PM Medical Record Number: [id number] Patient Account Number: [id number] Date of Birth/Sex: Aug 29, 1930 (81 y.o. Male) Treating RN: Afful, RN, BSN, Rita Primary Care Physician: Darreld Mclean Other Clinician: Referring Physician: Darreld Mclean Treating Physician/Extender: Jim [name] in Treatment: 0 Label Progress Note Print Version as History and Physical for this encounter Information Obtained From Patient Wound History Do you currently have one or more open woundso Yes How many open wounds do you currently haveo 2 Approximately how long have you had your woundso 3weeks How have you been treating your wound(s) until nowo dry dressing Has your wound(s) ever healed and then [name]-openedo No Have you had any lab work done in the past montho No Have you tested positive for an antibiotic resistant organism (MRSA, VRE)o No Have you tested positive for osteomyelitis (bone infection)o No Have you had any tests for circulation on your legso No Have you had other problems associated with your woundso Infection Eyes Complaints and Symptoms: Positive for:  Glasses / Contacts Medical History: Positive for: Cataracts - removed Integumentary (Skin) Complaints and Symptoms: Positive for: Wounds; Swelling Medical History: Negative for: History of Burn; History of pressure wounds Constitutional Symptoms (General Health) Medical History: Past Medical History Notes: HTN, Diabetes, Left BKA 2000, Dry Skin Ear/Nose/Mouth/Throat Complaints and Symptoms: No Complaints or Symptoms Hillis, Jevaun L. ([id number]) Medical History: Negative for: Chronic sinus problems/congestion; Middle ear problems Hematologic/Lymphatic Complaints and Symptoms: No Complaints or Symptoms Medical History: Negative for: Anemia; Hemophilia; Human Immunodeficiency Virus; Lymphedema; Sickle Cell Disease Respiratory Complaints and Symptoms: No Complaints or Symptoms Medical History: Negative for: Aspiration; Asthma; Chronic Obstructive Pulmonary Disease (COPD); Pneumothorax; Sleep Apnea; Tuberculosis Cardiovascular Complaints and Symptoms: No Complaints or Symptoms Medical History: Positive for: Hypertension Negative for: Angina; Arrhythmia; Congestive Heart Failure; Coronary Artery Disease; Deep Vein Thrombosis; Hypotension; Myocardial Infarction; Peripheral Arterial Disease; Peripheral Venous Disease; Phlebitis; Vasculitis Gastrointestinal Complaints and Symptoms: No Complaints or Symptoms Medical History: Negative for: Cirrhosis Endocrine Medical History: Positive for: Type II Diabetes Time with diabetes: 40 years Treated with: Insulin Blood sugar tested every day: Yes Tested : Blood sugar testing results: Breakfast: 150; Bedtime: 180 Genitourinary Noland, Gryffin L. ([id number]) Complaints and Symptoms: No Complaints or Symptoms Medical History: Negative for: End Stage Renal Disease Immunological Complaints and Symptoms: No Complaints or Symptoms Medical History: Negative for: Lupus Erythematosus; Raynaudos; Scleroderma Musculoskeletal Complaints  and Symptoms: No Complaints or Symptoms Complaints and Symptoms: Review of System Notes: left BKA Medical History: Positive for: Osteoarthritis Negative for: Gout; Rheumatoid Arthritis Neurologic Complaints and Symptoms: No Complaints or Symptoms Medical History: Negative for: Neuropathy Oncologic Medical History: Negative for: Received Chemotherapy; Received Radiation HBO Extended History Items Eyes: Cataracts Family and Social History Cancer: No; Diabetes: Yes - Mother; Heart Disease: No; Hypertension: No; Kidney Disease: No; Lung Disease: No; Seizures: No;  Stroke: No; Thyroid Problems: No; Former smoker - chewed tobacco, quit 39yrs ago; Marital Status - Married; Alcohol Use: Never; Drug Use: No History; Caffeine Use: Daily; Financial Concerns: No; Food, Clothing or Shelter Needs: No; Support System Lacking: No; Transportation Concerns: No; Advanced Directives: No; Patient does not want information on Advanced Directives; Living Will: No; Medical Power of Attorney: No Hemmingway, Temiloluwa L. ([id number]) Physician Affirmation I have reviewed and agree with the above information. Electronic Signature(s) Signed: 08/30/2015 2:35:28 PM By: Elpidio Eric BSN, RN Signed: 08/30/2015 4:57:18 PM By: Evlyn Kanner MD, FACS Previous Signature: 08/30/2015 2:15:06 PM Version By: Evlyn Kanner MD, FACS Previous Signature: 08/30/2015 2:13:48 PM Version By: Elpidio Eric BSN, RN Previous Signature: 08/30/2015 2:09:57 PM Version By: Elpidio Eric BSN, RN Entered By: Elpidio Eric on 08/30/2015 14:35:27 Schnorr, Shelby L. ([id number]) -------------------------------------------------------------------------------- SuperBill Details Patient Name: Everage, Keneth L. Date of Service: 08/30/2015 Medical Record Number: [id number] Patient Account Number: [id number] Date of Birth/Sex: 07-21-1930 (81 y.o. Male) Treating RN: Clover Mealy, RN, BSN, Rita Primary Care Physician: Darreld Mclean Other Clinician: Referring Physician: Darreld Mclean Treating Physician/Extender: Jim [name] in Treatment: 0 Diagnosis Coding ICD-10 Codes Code Description E11.621 Type 2 diabetes mellitus with foot ulcer Z89.512 Acquired absence of left leg below knee I70.25 Atherosclerosis of native arteries of other extremities with ulceration L97.512 Non-pressure chronic ulcer of other part of right foot with fat layer exposed Facility Procedures CPT4 Code Description: 08657846 WOUND CARE VISIT-LEV 3 NEW PT Modifier: Quantity: 1 CPT4 Code Description: 96295284 11042 - DEB SUBQ TISSUE 20 SQ CM/< ICD-10 Description Diagnosis E11.621 Type 2 diabetes mellitus with foot ulcer Z89.512 Acquired absence of left leg below knee I70.25 Atherosclerosis of native arteries of other extremiti  L97.512 Non-pressure chronic ulcer of other part of right foo Modifier: es with ulcer t with fat la Quantity: 1 ation yer exposed CPT4 Code Description: 13244010 11055 - PARE BENIGN LES; SGL ICD-10 Description Diagnosis E11.621 Type 2 diabetes mellitus with foot ulcer Z89.512 Acquired absence of left leg below knee I70.25 Atherosclerosis of native arteries of other extremiti  L97.512 Non-pressure chronic ulcer of other part of right foo Modifier: 58 es with ulcer t with fat la Quantity: 1 ation yer exposed Physician Procedures CPT4 Code Description: [id number] 99214 - WC PHYS LEVEL 4 - EST PT ICD-10 Description Diagnosis E11.621 Type 2 diabetes mellitus with foot ulcer Z89.512 Acquired absence of left leg below knee Martorana, Kelon L. ([id number]) Modifier: Quantity: 1 Electronic Signature(s) Signed: 08/30/2015 5:13:35 PM By: Elpidio Eric BSN, RN Previous Signature: 08/30/2015 3:18:34 PM Version By: Evlyn Kanner MD, FACS Entered By: Elpidio Eric on 08/30/2015 17:13:35

## 2015-08-30 NOTE — Progress Notes (Signed)
[name], [name] ([id number]) Visit Report for 08/30/2015 Allergy List Details Patient Name: [name], [name] Date of Service: 08/30/2015 1:45 PM Medical Record Number: [id number] Patient Account Number: [id number] Date of Birth/Sex: [birth year] (81 y.o. Male) Treating RN: Clover Mealy, RN, BSN, [place] Sink Primary Care Physician: Darreld Mclean Other Clinician: Referring Physician: Darreld Mclean Treating Physician/Extender: Rudene Re in Treatment: 0 Allergies Active Allergies No Known Drug Allergies Allergy Notes Electronic Signature(s) Signed: 08/30/2015 2:07:23 PM By: Elpidio Eric BSN, RN Entered By: Elpidio Eric on 08/30/2015 14:07:23 [name], [name] ([id number]) -------------------------------------------------------------------------------- Arrival Information Details Patient Name: Jim [name], [name] Date of Service: 08/30/2015 1:45 PM Medical Record Number: [id number] Patient Account Number: [id number] Date of Birth/Sex: 09-02-1930 (81 y.o. Male) Treating RN: Clover Mealy, RN, BSN, [place] Primary Care Physician: Darreld Mclean Other Clinician: Referring Physician: Darreld Mclean Treating Physician/Extender: Rudene Re in Treatment: 0 Visit Information Patient Arrived: Wheel Chair Arrival Time: 14:00 Accompanied By: self Transfer Assistance: None Secondary Verification Process Yes Completed: Patient Requires Transmission-Based No Precautions: Patient Has Alerts: No History Since Last Visit Any new allergies or adverse reactions: No Had a fall or experienced change in activities of daily living that may affect risk of falls: No Signs or symptoms of abuse/neglect since last visito No Hospitalized since last visit: No Has Dressing in Place as Prescribed: No Has Compression in Place as Prescribed: No Has Footwear/Offloading in Place as Prescribed: No Electronic Signature(s) Signed: 08/30/2015 2:03:42 PM By: Elpidio Eric BSN, RN Entered By: Elpidio Eric on 08/30/2015 14:03:41 [name], [name]  ([id number]) -------------------------------------------------------------------------------- Clinic Level of Care Assessment Details Patient Name: Blough, Gurpreet L. Date of Service: 08/30/2015 1:45 PM Medical Record Number: [id number] Patient Account Number: [id number] Date of Birth/Sex: Jan 01, 1930 (81 y.o. Male) Treating RN: Clover Mealy, RN, BSN,  Sink Primary Care Physician: Darreld Mclean Other Clinician: Referring Physician: Darreld Mclean Treating Physician/Extender: Rudene Re in Treatment: 0 Clinic Level of Care Assessment Items TOOL 1 Quantity Score []  - Use when EandM and Procedure is performed on INITIAL visit 0 ASSESSMENTS - Nursing Assessment / Reassessment X - General Physical Exam (combine w/ comprehensive assessment (listed just 1 20 below) when performed on new pt. evals) X - Comprehensive Assessment (HX, ROS, Risk Assessments, Wounds Hx, etc.) 1 25 ASSESSMENTS - Wound and Skin Assessment / Reassessment []  - Dermatologic / Skin Assessment (not related to wound area) 0 ASSESSMENTS - Ostomy and/or Continence Assessment and Care []  - Incontinence Assessment and Management 0 []  - Ostomy Care Assessment and Management (repouching, etc.) 0 PROCESS - Coordination of Care X - Simple Patient / Family Education for ongoing care 1 15 []  - Complex (extensive) Patient / Family Education for ongoing care 0 X - Staff obtains Chiropractor, Records, Test Results / Process Orders 1 10 []  - Staff telephones HHA, Nursing Homes / Clarify orders / etc 0 []  - Routine Transfer to another Facility (non-emergent condition) 0 []  - Routine Hospital Admission (non-emergent condition) 0 X - New Admissions / Manufacturing engineer / Ordering NPWT, Apligraf, etc. 1 15 []  - Emergency Hospital Admission (emergent condition) 0 PROCESS - Special Needs []  - Pediatric / Minor Patient Management 0 []  - Isolation Patient Management 0 [name], [name] ([id number]) []  - Hearing / Language / Visual special needs  0 []  - Assessment of Community assistance (transportation, D/C planning, etc.) 0 []  - Additional assistance / Altered mentation 0 []  - Support Surface(s) Assessment (bed, cushion, seat, etc.) 0 INTERVENTIONS - Miscellaneous []  - External ear exam 0 []  - Patient Transfer (multiple  staff / Michiel Sites Lift / Similar devices) 0 []  - Simple Staple / Suture removal (25 or less) 0 []  - Complex Staple / Suture removal (26 or more) 0 []  - Hypo/Hyperglycemic Management (do not check if billed separately) 0 X - Ankle / Brachial Index (ABI) - do not check if billed separately 1 15 Has the patient been seen at the hospital within the last three years: Yes Total Score: 100 Level Of Care: New/Established - Level 3 Electronic Signature(s) Signed: 08/30/2015 5:13:05 PM By: Elpidio Eric BSN, RN Entered By: Elpidio Eric on 08/30/2015 17:13:05 [name], [name] ([id number]) -------------------------------------------------------------------------------- Encounter Discharge Information Details Patient Name: Jim [name], [name] Date of Service: 08/30/2015 1:45 PM Medical Record Number: [id number] Patient Account Number: [id number] Date of Birth/Sex: [birth year] (81 y.o. Male) Treating RN: Clover Mealy, RN, BSN, [place] Primary Care Physician: Darreld Mclean Other Clinician: Referring Physician: Darreld Mclean Treating Physician/Extender: Rudene Re in Treatment: 0 Encounter Discharge Information Items Discharge Pain Level: 0 Discharge Condition: Stable Ambulatory Status: Wheelchair Discharge Destination: Home Transportation: Private Auto Accompanied By: self Schedule Follow-up Appointment: No Medication Reconciliation completed No and provided to Patient/Care Kalissa Grays: Provided on Clinical Summary of Care: 08/30/2015 Form Type Recipient Paper Patient [facility] Electronic Signature(s) Signed: 08/30/2015 3:12:29 PM By: Elpidio Eric BSN, RN Previous Signature: 08/30/2015 3:07:28 PM Version By: Gwenlyn Perking Entered By: Elpidio Eric  on 08/30/2015 15:12:28 [name], [name] ([id number]) -------------------------------------------------------------------------------- Lower Extremity Assessment Details Patient Name: Jim [name], [name] Date of Service: 08/30/2015 1:45 PM Medical Record Number: [id number] Patient Account Number: [id number] Date of Birth/Sex: June 18, 1930 (81 y.o. Male) Treating RN: Clover Mealy, RN, BSN, [place] Sink Primary Care Physician: Darreld Mclean Other Clinician: Referring Physician: Darreld Mclean Treating Physician/Extender: Rudene Re in Treatment: 0 Vascular Assessment Pulses: Posterior Tibial Palpable: [Right:No] Doppler: [Right:Monophasic] Dorsalis Pedis Palpable: [Right:No] Doppler: [Right:Monophasic] Extremity colors, hair growth, and conditions: Extremity Color: [Right:Hyperpigmented] Hair Growth on Extremity: [Right:No] Temperature of Extremity: [Right:Warm] Capillary Refill: [Right:< 3 seconds] Blood Pressure: Brachial: [Right:152] Dorsalis Pedis: [Left:Dorsalis Pedis:] Ankle: Posterior Tibial: [Left:Posterior Tibial: 115] [Right:0.76] Toe Nail Assessment Left: Right: Thick: Yes Discolored: Yes Deformed: No Improper Length and Hygiene: Yes Electronic Signature(s) Signed: 08/30/2015 2:28:17 PM By: Elpidio Eric BSN, RN Previous Signature: 08/30/2015 2:25:50 PM Version By: Elpidio Eric BSN, RN Entered By: Elpidio Eric on 08/30/2015 14:28:17 [name], [name] ([id number]) -------------------------------------------------------------------------------- Multi Wound Chart Details Patient Name: Jim [name], [name] Date of Service: 08/30/2015 1:45 PM Medical Record Number: [id number] Patient Account Number: [id number] Date of Birth/Sex: 11-12-1930 (81 y.o. Male) Treating RN: Clover Mealy, RN, BSN, [place] Sink Primary Care Physician: Darreld Mclean Other Clinician: Referring Physician: Darreld Mclean Treating Physician/Extender: Rudene Re in Treatment: 0 Vital Signs Height(in): 73 Pulse(bpm): 88 Weight(lbs):  222 Blood Pressure 152/67 (mmHg): Body Mass Index(BMI): 29 Temperature(F): 98.4 Respiratory Rate 16 (breaths/min): Photos: [2:No Photos] [3:No Photos] [N/A:N/A] Wound Location: [2:Right Foot - Plantar, Lateral] [3:Right Toe Fourth - Dorsal N/A] Wounding Event: [2:Footwear Injury] [3:Shear/Friction] [N/A:N/A] Primary Etiology: [2:Diabetic Wound/Ulcer of the Lower Extremity] [3:Diabetic Wound/Ulcer of the Lower Extremity] [N/A:N/A] Comorbid History: [2:Cataracts, Hypertension, Type II Diabetes, Osteoarthritis] [3:Cataracts, Hypertension, Type II Diabetes, Osteoarthritis] [N/A:N/A] Date Acquired: [2:08/08/2015] [3:08/08/2015] [N/A:N/A] Weeks of Treatment: [2:0] [3:0] [N/A:N/A] Wound Status: [2:Open] [3:Open] [N/A:N/A] Measurements L x W x D 1x1.2x0.2 [3:1x1x0.1] [N/A:N/A] (cm) Area (cm) : [2:0.942] [3:0.785] [N/A:N/A] Volume (cm) : [2:0.188] [3:0.079] [N/A:N/A] % Reduction in Area: [2:0.00%] [3:0.00%] [N/A:N/A] % Reduction in Volume: 0.00% [3:0.00%] [N/A:N/A] Classification: [2:Grade 1] [3:Grade 0] [N/A:N/A] Exudate Amount: [2:Small] [3:Small] [N/A:N/A] Exudate Type: [2:Serosanguineous] [3:Serous] [N/A:N/A]  Exudate Color: [2:red, brown] [3:amber] [N/A:N/A] Foul Odor After [2:Yes] [3:No] [N/A:N/A] Cleansing: Odor Anticipated Due to No [3:N/A] [N/A:N/A] Product Use: Wound Margin: [2:Thickened] [3:Distinct, outline attached] [N/A:N/A] Granulation Amount: [2:Medium (34-66%)] [3:Large (67-100%)] [N/A:N/A] Granulation Quality: [2:Pink, Pale] [3:Red, Pink] [N/A:N/A] Necrotic Amount: [2:Medium (34-66%)] [3:None Present (0%)] [N/A:N/A] Exposed Structures: Fascia: No Fascia: No N/A Fat: No Fat: No Tendon: No Tendon: No Muscle: No Muscle: No Joint: No Joint: No Bone: No Bone: No Limited to Skin Limited to Skin Breakdown Breakdown Epithelialization: None Small (1-33%) N/A Debridement: Debridement (16109- N/A N/A 11047) Time-Out Taken: Yes N/A N/A Pain Control: Lidocaine 4%  Topical N/A N/A Solution Tissue Debrided: Fibrin/Slough, Exudates, N/A N/A Subcutaneous Level: Skin/Subcutaneous N/A N/A Tissue Debridement Area (sq 1.2 N/A N/A cm): Instrument: Curette N/A N/A Bleeding: Minimum N/A N/A Hemostasis Achieved: Pressure N/A N/A Procedural Pain: 0 N/A N/A Post Procedural Pain: 0 N/A N/A Debridement Treatment Procedure was tolerated N/A N/A Response: well Post Debridement 1x1.2x0.2 N/A N/A Measurements L x W x D (cm) Post Debridement 0.188 N/A N/A Volume: (cm) Periwound Skin Texture: Callus: Yes Edema: No N/A Edema: No Excoriation: No Excoriation: No Induration: No Induration: No Callus: No Crepitus: No Crepitus: No Fluctuance: No Fluctuance: No Friable: No Friable: No Rash: No Rash: No Scarring: No Scarring: No Periwound Skin Moist: Yes Moist: Yes N/A Moisture: Maceration: No Maceration: No Dry/Scaly: No Dry/Scaly: No Periwound Skin Color: Atrophie Blanche: No Atrophie Blanche: No N/A Cyanosis: No Cyanosis: No Ecchymosis: No Ecchymosis: No Erythema: No Erythema: No Hemosiderin Staining: No Hemosiderin Staining: No Mottled: No Mottled: No [name], [name] ([id number]) Pallor: No Pallor: No Rubor: No Rubor: No Temperature: No Abnormality No Abnormality N/A Tenderness on Yes No N/A Palpation: Wound Preparation: Ulcer Cleansing: Ulcer Cleansing: N/A Rinsed/Irrigated with Rinsed/Irrigated with Saline Saline Topical Anesthetic Topical Anesthetic Applied: Other: lidocaine Applied: None 4% Procedures Performed: Callus Pairing N/A N/A Debridement Treatment Notes Electronic Signature(s) Signed: 08/30/2015 2:49:23 PM By: Elpidio Eric BSN, RN Entered By: Elpidio Eric on 08/30/2015 14:49:22 Maiorino, Brooke L. ([id number]) -------------------------------------------------------------------------------- Multi-Disciplinary Care Plan Details Patient Name: Jim [name], [name] Date of Service: 08/30/2015 1:45 PM Medical Record Number:  [id number] Patient Account Number: [id number] Date of Birth/Sex: [birth year] (81 y.o. Male) Treating RN: Clover Mealy, RN, BSN, [place] Sink Primary Care Physician: Darreld Mclean Other Clinician: Referring Physician: Darreld Mclean Treating Physician/Extender: Rudene Re in Treatment: 0 Active Inactive Abuse / Safety / Falls / Self Care Management Nursing Diagnoses: Impaired home maintenance Impaired physical mobility Knowledge deficit related to: safety; personal, health (wound), emergency Potential for falls Self care deficit: actual or potential Goals: Patient will remain injury free Date Initiated: 08/30/2015 Goal Status: Active Patient/caregiver will verbalize understanding of skin care regimen Date Initiated: 08/30/2015 Goal Status: Active Patient/caregiver will verbalize understanding of the importance to maintain current immunizations/vaccinations Date Initiated: 08/30/2015 Goal Status: Active Patient/caregiver will verbalize/demonstrate measures taken to improve the patient's personal safety Date Initiated: 08/30/2015 Goal Status: Active Patient/caregiver will verbalize/demonstrate measures taken to prevent injury and/or falls Date Initiated: 08/30/2015 Goal Status: Active Patient/caregiver will verbalize/demonstrate understanding of what to do in case of emergency Date Initiated: 08/30/2015 Goal Status: Active Interventions: Provide education on basic hygiene Provide education on fall prevention Provide education on personal and home safety Provide education on safe transfers Broyhill, Kekoa L. ([id number]) Notes: Orientation to the Wound Care Program Nursing Diagnoses: Knowledge deficit related to the wound healing center program Goals: Patient/caregiver will verbalize understanding of the Wound Healing Center Program Date Initiated: 08/30/2015 Goal Status: Active Interventions: Provide  education on orientation to the wound center Notes: Wound/Skin Impairment Nursing  Diagnoses: Impaired tissue integrity Knowledge deficit related to ulceration/compromised skin integrity Goals: Patient/caregiver will verbalize understanding of skin care regimen Date Initiated: 08/30/2015 Goal Status: Active Ulcer/skin breakdown will have a volume reduction of 50% by week 8 Date Initiated: 08/30/2015 Goal Status: Active Ulcer/skin breakdown will heal within 14 weeks Date Initiated: 08/30/2015 Goal Status: Active Interventions: Assess patient/caregiver ability to obtain necessary supplies Assess patient/caregiver ability to perform ulcer/skin care regimen upon admission and as needed Assess ulceration(s) every visit Provide education on ulcer and skin care Treatment Activities: Skin care regimen initiated : 08/30/2015 Topical wound management initiated : 08/30/2015 Notes: Aloia, Jordell L. ([id number]) Electronic Signature(s) Signed: 08/30/2015 2:49:02 PM By: Elpidio Eric BSN, RN Entered By: Elpidio Eric on 08/30/2015 14:49:01 Stamey, Miguelangel L. ([id number]) -------------------------------------------------------------------------------- Pain Assessment Details Patient Name: Jim [name], Andry L. Date of Service: 08/30/2015 1:45 PM Medical Record Number: [id number] Patient Account Number: [id number] Date of Birth/Sex: February 11, 1930 (81 y.o. Male) Treating RN: Clover Mealy, RN, BSN, [place] Primary Care Physician: Darreld Mclean Other Clinician: Referring Physician: Darreld Mclean Treating Physician/Extender: Rudene Re in Treatment: 0 Active Problems Location of Pain Severity and Description of Pain Patient Has Paino No Site Locations Pain Management and Medication Current Pain Management: Electronic Signature(s) Signed: 08/30/2015 2:03:49 PM By: Elpidio Eric BSN, RN Entered By: Elpidio Eric on 08/30/2015 14:03:49 Jenny, Zevin L. ([id number]) -------------------------------------------------------------------------------- Patient/Caregiver Education Details Patient Name: [name] Date of Service: 08/30/2015 1:45 PM Medical Record Number: [id number] Patient Account Number: [id number] Date of Birth/Gender: 03-Apr-1930 (81 y.o. Male) Treating RN: Clover Mealy, RN, BSN, [place] Sink Primary Care Physician: Darreld Mclean Other Clinician: Referring Physician: Darreld Mclean Treating Physician/Extender: Rudene Re in Treatment: 0 Education Assessment Education Provided To: Patient Education Topics Provided Basic Hygiene: Methods: Explain/Verbal Responses: State content correctly Safety: Methods: Explain/Verbal Responses: State content correctly Welcome To The [facility]: Methods: Explain/Verbal Responses: State content correctly Wound/Skin Impairment: Methods: Explain/Verbal Responses: State content correctly Electronic Signature(s) Signed: 08/30/2015 3:12:49 PM By: Elpidio Eric BSN, RN Entered By: Elpidio Eric on 08/30/2015 15:12:49 Knoles, Jhalil L. ([id number]) -------------------------------------------------------------------------------- Wound Assessment Details Patient Name: Jim [name], Nayshawn L. Date of Service: 08/30/2015 1:45 PM Medical Record Number: [id number] Patient Account Number: [id number] Date of Birth/Sex: 10-Oct-1930 (81 y.o. Male) Treating RN: Clover Mealy, RN, BSN, [place] Sink Primary Care Physician: Darreld Mclean Other Clinician: Referring Physician: Darreld Mclean Treating Physician/Extender: Rudene Re in Treatment: 0 Wound Status Wound Number: 2 Primary Diabetic Wound/Ulcer of the Lower Etiology: Extremity Wound Location: Right Foot - Plantar, Lateral Wound Open Wounding Event: Footwear Injury Status: Date Acquired: 08/08/2015 Comorbid Cataracts, Hypertension, Type II Weeks Of Treatment: 0 History: Diabetes, Osteoarthritis Clustered Wound: No Photos Photo Uploaded By: Elpidio Eric on 08/30/2015 17:16:30 Wound Measurements Length: (cm) 1 Width: (cm) 1.2 Depth: (cm) 0.2 Area: (cm) 0.942 Volume: (cm) 0.188 % Reduction in Area: 0% %  Reduction in Volume: 0% Epithelialization: None Tunneling: No Undermining: No Wound Description Classification: Grade 1 Wound Margin: Thickened Exudate Amount: Small Exudate Type: Serosanguineous Exudate Color: red, brown Foul Odor After Cleansing: Yes Due to Product Use: No Wound Bed Granulation Amount: Medium (34-66%) Exposed Structure Granulation Quality: Pink, Pale Fascia Exposed: No Necrotic Amount: Medium (34-66%) Fat Layer Exposed: No Necrotic Quality: Adherent Slough Tendon Exposed: No Strand, Kaylem L. ([id number]) Muscle Exposed: No Joint Exposed: No Bone Exposed: No Limited to Skin Breakdown Periwound Skin Texture Texture Color No Abnormalities Noted: No No Abnormalities Noted: No Callus: Yes Atrophie Blanche: No  Crepitus: No Cyanosis: No Excoriation: No Ecchymosis: No Fluctuance: No Erythema: No Friable: No Hemosiderin Staining: No Induration: No Mottled: No Localized Edema: No Pallor: No Rash: No Rubor: No Scarring: No Temperature / Pain Moisture Temperature: No Abnormality No Abnormalities Noted: No Tenderness on Palpation: Yes Dry / Scaly: No Maceration: No Moist: Yes Wound Preparation Ulcer Cleansing: Rinsed/Irrigated with Saline Topical Anesthetic Applied: Other: lidocaine 4%, Treatment Notes Wound #2 (Right, Lateral, Plantar Foot) 1. Cleansed with: Clean wound with Normal Saline 2. Anesthetic Topical Lidocaine 4% cream to wound bed prior to debridement 4. Dressing Applied: Aquacel Ag 5. Secondary Dressing Applied Bordered Foam Dressing 6. Footwear/Offloading device applied Felt/Foam Surgical shoe 7. Secured with Secretary/administrator) Signed: 08/30/2015 2:16:43 PM By: Elpidio Eric BSN, RN Entered By: Elpidio Eric on 08/30/2015 14:16:42 Galambos, Johnpaul L. ([id number]) Cohill, Nicklos L. ([id number]) -------------------------------------------------------------------------------- Wound Assessment Details Patient Name: Dworkin, Dayshon  L. Date of Service: 08/30/2015 1:45 PM Medical Record Number: [id number] Patient Account Number: [id number] Date of Birth/Sex: 02-18-1930 (81 y.o. Male) Treating RN: Clover Mealy, RN, BSN, [place] Primary Care Physician: Darreld Mclean Other Clinician: Referring Physician: Darreld Mclean Treating Physician/Extender: Rudene Re in Treatment: 0 Wound Status Wound Number: 3 Primary Diabetic Wound/Ulcer of the Lower Etiology: Extremity Wound Location: Right Toe Fourth - Dorsal Wound Open Wounding Event: Shear/Friction Status: Date Acquired: 08/08/2015 Comorbid Cataracts, Hypertension, Type II Weeks Of Treatment: 0 History: Diabetes, Osteoarthritis Clustered Wound: No Photos Photo Uploaded By: Elpidio Eric on 08/30/2015 17:16:30 Wound Measurements Length: (cm) 1 Width: (cm) 1 Depth: (cm) 0.1 Area: (cm) 0.785 Volume: (cm) 0.079 % Reduction in Area: 0% % Reduction in Volume: 0% Epithelialization: Small (1-33%) Tunneling: No Undermining: No Wound Description Classification: Grade 0 Foul Odor Aft Wound Margin: Distinct, outline attached Exudate Amount: Small Exudate Type: Serous Exudate Color: amber er Cleansing: No Wound Bed Granulation Amount: Large (67-100%) Exposed Structure Granulation Quality: Red, Pink Fascia Exposed: No Necrotic Amount: None Present (0%) Fat Layer Exposed: No Tendon Exposed: No Nagele, Aryan L. ([id number]) Muscle Exposed: No Joint Exposed: No Bone Exposed: No Limited to Skin Breakdown Periwound Skin Texture Texture Color No Abnormalities Noted: No No Abnormalities Noted: No Callus: No Atrophie Blanche: No Crepitus: No Cyanosis: No Excoriation: No Ecchymosis: No Fluctuance: No Erythema: No Friable: No Hemosiderin Staining: No Induration: No Mottled: No Localized Edema: No Pallor: No Rash: No Rubor: No Scarring: No Temperature / Pain Moisture Temperature: No Abnormality No Abnormalities Noted: No Dry / Scaly: No Maceration:  No Moist: Yes Wound Preparation Ulcer Cleansing: Rinsed/Irrigated with Saline Topical Anesthetic Applied: None Treatment Notes Wound #3 (Right, Dorsal Toe Fourth) 1. Cleansed with: Clean wound with Normal Saline 2. Anesthetic Topical Lidocaine 4% cream to wound bed prior to debridement 4. Dressing Applied: Aquacel Ag 5. Secondary Dressing Applied Bordered Foam Dressing 6. Footwear/Offloading device applied Felt/Foam Surgical shoe 7. Secured with Secretary/administrator) Signed: 08/30/2015 2:18:36 PM By: Elpidio Eric BSN, RN Entered By: Elpidio Eric on 08/30/2015 14:18:36 Leighton, Million L. ([id number]) Zeller, Clements L. ([id number]) -------------------------------------------------------------------------------- Vitals Details Patient Name: Jim [name], Caven L. Date of Service: 08/30/2015 1:45 PM Medical Record Number: [id number] Patient Account Number: [id number] Date of Birth/Sex: 05-Oct-1930 (81 y.o. Male) Treating RN: Clover Mealy, RN, BSN, [place] Sink Primary Care Physician: Darreld Mclean Other Clinician: Referring Physician: Darreld Mclean Treating Physician/Extender: Rudene Re in Treatment: 0 Vital Signs Time Taken: 14:03 Temperature (F): 98.4 Height (in): 73 Pulse (bpm): 88 Source: Stated Respiratory Rate (breaths/min): 16 Weight (lbs): 222 Blood Pressure (mmHg): 152/67 Source: Stated Reference Range:  80 - 120 mg / dl Body Mass Index (BMI): 29.3 Electronic Signature(s) Signed: 08/30/2015 2:04:45 PM By: Elpidio Eric BSN, RN Entered By: Elpidio Eric on 08/30/2015 14:04:45

## 2015-09-06 ENCOUNTER — Ambulatory Visit: Payer: Medicare HMO | Admitting: Surgery

## 2015-09-07 ENCOUNTER — Encounter: Payer: Medicare HMO | Admitting: Surgery

## 2015-09-07 DIAGNOSIS — L97512 Non-pressure chronic ulcer of other part of right foot with fat layer exposed: Secondary | ICD-10-CM | POA: Diagnosis not present

## 2015-09-08 NOTE — Progress Notes (Addendum)
Jim [name], [name] ([id number]) Visit Report for 09/07/2015 Arrival Information Details Patient Name: Jim [name], [name] Date of Service: 09/07/2015 8:00 AM Medical Record Number: [id number] Patient Account Number: [id number] Date of Birth/Sex: 07-Apr-1930 (81 y.o. Male) Treating RN: Clover Mealy, RN, BSN, [place] Sink Primary Care Physician: Darreld Mclean Other Clinician: Referring Physician: Darreld Mclean Treating Physician/Extender: Rudene Re in Treatment: 1 Visit Information History Since Last Visit Any new allergies or adverse reactions: No Patient Arrived: Wheel Chair Had a fall or experienced change in No activities of daily living that may affect Arrival Time: 08:09 risk of falls: Accompanied By: self Signs or symptoms of abuse/neglect since last No Transfer Assistance: None visito Patient Identification Verified: Yes Has Dressing in Place as Prescribed: Yes Secondary Verification Process Yes Pain Present Now: No Completed: Patient Requires Transmission-Based No Precautions: Patient Has Alerts: No Electronic Signature(s) Signed: 09/07/2015 8:11:35 AM By: Elpidio Eric BSN, RN Entered By: Elpidio Eric on 09/07/2015 08:11:35 Jim [name], [name] ([id number]) -------------------------------------------------------------------------------- Encounter Discharge Information Details Patient Name: Jim Pilgrim. Date of Service: 09/07/2015 8:00 AM Medical Record Number: [id number] Patient Account Number: [id number] Date of Birth/Sex: 1930-09-08 (81 y.o. Male) Treating RN: Clover Mealy, RN, BSN, [place] Primary Care Physician: Darreld Mclean Other Clinician: Referring Physician: Darreld Mclean Treating Physician/Extender: Rudene Re in Treatment: 1 Encounter Discharge Information Items Discharge Pain Level: 0 Discharge Condition: Stable Ambulatory Status: Wheelchair Discharge Destination: Home Transportation: Private Auto Accompanied By: self Schedule Follow-up Appointment: No Medication  Reconciliation completed No and provided to Patient/Care Tarnesha Ulloa: Provided on Clinical Summary of Care: 09/07/2015 Form Type Recipient Paper Patient [facility] Electronic Signature(s) Signed: 09/07/2015 8:40:15 AM By: Elpidio Eric BSN, RN Previous Signature: 09/07/2015 8:39:43 AM Version By: Gwenlyn Perking Entered By: Elpidio Eric on 09/07/2015 08:40:15 Felmlee, Paxson L. ([id number]) -------------------------------------------------------------------------------- Lower Extremity Assessment Details Patient Name: Jim [name], [name] Date of Service: 09/07/2015 8:00 AM Medical Record Number: [id number] Patient Account Number: [id number] Date of Birth/Sex: 05/11/1930 (81 y.o. Male) Treating RN: Clover Mealy, RN, BSN, [place] Sink Primary Care Physician: Darreld Mclean Other Clinician: Referring Physician: Darreld Mclean Treating Physician/Extender: Rudene Re in Treatment: 1 Vascular Assessment Pulses: Posterior Tibial Dorsalis Pedis Palpable: [Right:No] Doppler: [Right:Monophasic] Extremity colors, hair growth, and conditions: Extremity Color: [Right:Hyperpigmented] Hair Growth on Extremity: [Right:No] Temperature of Extremity: [Right:Warm] Capillary Refill: [Right:< 3 seconds] Toe Nail Assessment Left: Right: Thick: Yes Discolored: Yes Deformed: No Improper Length and Hygiene: No Electronic Signature(s) Signed: 09/07/2015 8:15:25 AM By: Elpidio Eric BSN, RN Entered By: Elpidio Eric on 09/07/2015 08:15:25 Jim [name], [name] ([id number]) -------------------------------------------------------------------------------- Multi Wound Chart Details Patient Name: Jim [name], [name] Date of Service: 09/07/2015 8:00 AM Medical Record Number: [id number] Patient Account Number: [id number] Date of Birth/Sex: 1930/10/13 (81 y.o. Male) Treating RN: Clover Mealy, RN, BSN, [place] Sink Primary Care Physician: Darreld Mclean Other Clinician: Referring Physician: Darreld Mclean Treating Physician/Extender: Rudene Re in Treatment: 1 Vital  Signs Height(in): 73 Pulse(bpm): 82 Weight(lbs): 222 Blood Pressure 183/126 (mmHg): Body Mass Index(BMI): 29 Temperature(F): 97.8 Respiratory Rate 17 (breaths/min): Photos: [2:No Photos] [3:No Photos] [N/A:N/A] Wound Location: [2:Right Foot - Plantar, Lateral] [3:Right, Dorsal Toe Fourth N/A] Wounding Event: [2:Footwear Injury] [3:Shear/Friction] [N/A:N/A] Primary Etiology: [2:Diabetic Wound/Ulcer of the Lower Extremity] [3:Diabetic Wound/Ulcer of the Lower Extremity] [N/A:N/A] Comorbid History: [2:Cataracts, Hypertension, Type II Diabetes, Osteoarthritis] [3:N/A] [N/A:N/A] Date Acquired: [2:08/08/2015] [3:08/08/2015] [N/A:N/A] Weeks of Treatment: [2:1] [3:1] [N/A:N/A] Wound Status: [2:Open] [3:Healed - Epithelialized] [N/A:N/A] Measurements L x W x D 1.2x1.1x0.2 [3:0x0x0] [N/A:N/A] (cm) Area (cm) : [2:1.037] [3:0] [N/A:N/A] Volume (cm) : [2:0.207] [3:0] [N/A:N/A] %  Reduction in Area: [2:-10.10%] [3:100.00%] [N/A:N/A] % Reduction in Volume: -10.10% [3:100.00%] [N/A:N/A] Classification: [2:Grade 1] [3:Grade 0] [N/A:N/A] Exudate Amount: [2:Small] [3:N/A] [N/A:N/A] Exudate Type: [2:Serosanguineous] [3:N/A] [N/A:N/A] Exudate Color: [2:red, brown] [3:N/A] [N/A:N/A] Wound Margin: [2:Thickened] [3:N/A] [N/A:N/A] Granulation Amount: [2:Medium (34-66%)] [3:N/A] [N/A:N/A] Granulation Quality: [2:Pink, Pale] [3:N/A] [N/A:N/A] Necrotic Amount: [2:Small (1-33%)] [3:N/A] [N/A:N/A] Exposed Structures: [2:Fascia: No Fat: No Tendon: No Muscle: No] [3:N/A] [N/A:N/A] Joint: No Bone: No Limited to Skin Breakdown Epithelialization: None N/A N/A Periwound Skin Texture: Callus: Yes No Abnormalities Noted N/A Edema: No Excoriation: No Induration: No Crepitus: No Fluctuance: No Friable: No Rash: No Scarring: No Periwound Skin Moist: Yes No Abnormalities Noted N/A Moisture: Maceration: No Dry/Scaly: No Periwound Skin Color: Atrophie Blanche: No No Abnormalities Noted N/A Cyanosis:  No Ecchymosis: No Erythema: No Hemosiderin Staining: No Mottled: No Pallor: No Rubor: No Temperature: No Abnormality N/A N/A Tenderness on No No N/A Palpation: Wound Preparation: Ulcer Cleansing: N/A N/A Rinsed/Irrigated with Saline Topical Anesthetic Applied: Other: lidocaine 4% Treatment Notes Electronic Signature(s) Signed: 09/07/2015 8:26:15 AM By: Elpidio Eric BSN, RN Entered By: Elpidio Eric on 09/07/2015 08:26:15 Jim [name], [name] ([id number]) -------------------------------------------------------------------------------- Multi-Disciplinary Care Plan Details Patient Name: Jim [name], Dontreal L. Date of Service: 09/07/2015 8:00 AM Medical Record Number: [id number] Patient Account Number: [id number] Date of Birth/Sex: 12-28-1930 (81 y.o. Male) Treating RN: Clover Mealy, RN, BSN, [place] Sink Primary Care Physician: Darreld Mclean Other Clinician: Referring Physician: Darreld Mclean Treating Physician/Extender: Rudene Re in Treatment: 1 Active Inactive Abuse / Safety / Falls / Self Care Management Nursing Diagnoses: Impaired home maintenance Impaired physical mobility Knowledge deficit related to: safety; personal, health (wound), emergency Potential for falls Self care deficit: actual or potential Goals: Patient will remain injury free Date Initiated: 08/30/2015 Goal Status: Active Patient/caregiver will verbalize understanding of skin care regimen Date Initiated: 08/30/2015 Goal Status: Active Patient/caregiver will verbalize understanding of the importance to maintain current immunizations/vaccinations Date Initiated: 08/30/2015 Goal Status: Active Patient/caregiver will verbalize/demonstrate measures taken to improve the patient's personal safety Date Initiated: 08/30/2015 Goal Status: Active Patient/caregiver will verbalize/demonstrate measures taken to prevent injury and/or falls Date Initiated: 08/30/2015 Goal Status: Active Patient/caregiver will verbalize/demonstrate  understanding of what to do in case of emergency Date Initiated: 08/30/2015 Goal Status: Active Interventions: Provide education on basic hygiene Provide education on fall prevention Provide education on personal and home safety Provide education on safe transfers Cataldo, Gregary L. ([id number]) Treatment Activities: Education provided on Basic Hygiene : 08/30/2015 Notes: Orientation to the Wound Care Program Nursing Diagnoses: Knowledge deficit related to the wound healing center program Goals: Patient/caregiver will verbalize understanding of the Wound Healing Center Program Date Initiated: 08/30/2015 Goal Status: Active Interventions: Provide education on orientation to the wound center Notes: Wound/Skin Impairment Nursing Diagnoses: Impaired tissue integrity Knowledge deficit related to ulceration/compromised skin integrity Goals: Patient/caregiver will verbalize understanding of skin care regimen Date Initiated: 08/30/2015 Goal Status: Active Ulcer/skin breakdown will have a volume reduction of 50% by week 8 Date Initiated: 08/30/2015 Goal Status: Active Ulcer/skin breakdown will heal within 14 weeks Date Initiated: 08/30/2015 Goal Status: Active Interventions: Assess patient/caregiver ability to obtain necessary supplies Assess patient/caregiver ability to perform ulcer/skin care regimen upon admission and as needed Assess ulceration(s) every visit Provide education on ulcer and skin care Treatment Activities: Skin care regimen initiated : 09/07/2015 Topical wound management initiated : 09/07/2015 Jim [name], [name] ([id number]) Notes: Electronic Signature(s) Signed: 09/07/2015 8:25:59 AM By: Elpidio Eric BSN, RN Entered By: Elpidio Eric on 09/07/2015 08:25:59 Jim [name], [name] ([id number]) -------------------------------------------------------------------------------- Pain  Assessment Details Patient Name: Jim [name], [name] Date of Service: 09/07/2015 8:00 AM Medical Record Number:  [id number] Patient Account Number: [id number] Date of Birth/Sex: January 18, 1930 (81 y.o. Male) Treating RN: Clover Mealy, RN, BSN, [place] Sink Primary Care Physician: Darreld Mclean Other Clinician: Referring Physician: Darreld Mclean Treating Physician/Extender: Rudene Re in Treatment: 1 Active Problems Location of Pain Severity and Description of Pain Patient Has Paino No Site Locations Pain Management and Medication Current Pain Management: Electronic Signature(s) Signed: 09/07/2015 8:11:45 AM By: Elpidio Eric BSN, RN Entered By: Elpidio Eric on 09/07/2015 08:11:45 Jim [name], [name] ([id number]) -------------------------------------------------------------------------------- Patient/Caregiver Education Details Patient Name: Jim Pilgrim. Date of Service: 09/07/2015 8:00 AM Medical Record Number: [id number] Patient Account Number: [id number] Date of Birth/Gender: 07-28-1930 (81 y.o. Male) Treating RN: Clover Mealy, RN, BSN, [place] Sink Primary Care Physician: Darreld Mclean Other Clinician: Referring Physician: Darreld Mclean Treating Physician/Extender: Rudene Re in Treatment: 1 Education Assessment Education Provided To: Patient Education Topics Provided Basic Hygiene: Methods: Explain/Verbal Responses: State content correctly Safety: Methods: Explain/Verbal Responses: State content correctly Welcome To The [facility]: Methods: Explain/Verbal Responses: State content correctly Wound/Skin Impairment: Methods: Explain/Verbal Responses: State content correctly Electronic Signature(s) Signed: 09/07/2015 8:47:06 AM By: Elpidio Eric BSN, RN Entered By: Elpidio Eric on 09/07/2015 08:47:06 Jim [name], [name] ([id number]) -------------------------------------------------------------------------------- Wound Assessment Details Patient Name: Jim [name], Lonie L. Date of Service: 09/07/2015 8:00 AM Medical Record Number: [id number] Patient Account Number: [id number] Date of Birth/Sex: Oct 28, 1930 (81 y.o.  Male) Treating RN: Clover Mealy, RN, BSN, [place] Sink Primary Care Physician: Darreld Mclean Other Clinician: Referring Physician: Darreld Mclean Treating Physician/Extender: Rudene Re in Treatment: 1 Wound Status Wound Number: 2 Primary Diabetic Wound/Ulcer of the Lower Etiology: Extremity Wound Location: Right Foot - Plantar, Lateral Wound Open Wounding Event: Footwear Injury Status: Date Acquired: 08/08/2015 Comorbid Cataracts, Hypertension, Type II Weeks Of Treatment: 1 History: Diabetes, Osteoarthritis Clustered Wound: No Photos Photo Uploaded By: Elpidio Eric on 09/07/2015 15:52:42 Wound Measurements Length: (cm) 1.2 Width: (cm) 1.1 Depth: (cm) 0.2 Area: (cm) 1.037 Volume: (cm) 0.207 % Reduction in Area: -10.1% % Reduction in Volume: -10.1% Epithelialization: None Tunneling: No Undermining: No Wound Description Classification: Grade 1 Foul Odor Af Wound Margin: Thickened Exudate Amount: Small Exudate Type: Serosanguineous Exudate Color: red, brown ter Cleansing: No Wound Bed Granulation Amount: Medium (34-66%) Exposed Structure Granulation Quality: Pink, Pale Fascia Exposed: No Necrotic Amount: Small (1-33%) Fat Layer Exposed: No Necrotic Quality: Adherent Slough Tendon Exposed: No Southers, Davie L. ([id number]) Muscle Exposed: No Joint Exposed: No Bone Exposed: No Limited to Skin Breakdown Periwound Skin Texture Texture Color No Abnormalities Noted: No No Abnormalities Noted: No Callus: Yes Atrophie Blanche: No Crepitus: No Cyanosis: No Excoriation: No Ecchymosis: No Fluctuance: No Erythema: No Friable: No Hemosiderin Staining: No Induration: No Mottled: No Localized Edema: No Pallor: No Rash: No Rubor: No Scarring: No Temperature / Pain Moisture Temperature: No Abnormality No Abnormalities Noted: No Dry / Scaly: No Maceration: No Moist: Yes Wound Preparation Ulcer Cleansing: Rinsed/Irrigated with Saline Topical Anesthetic Applied: Other:  lidocaine 4%, Treatment Notes Wound #2 (Right, Lateral, Plantar Foot) 1. Cleansed with: Clean wound with Normal Saline 4. Dressing Applied: Aquacel Ag 5. Secondary Dressing Applied Gauze and Kerlix/Conform 6. Footwear/Offloading device applied Felt/Foam 7. Secured with Secretary/administrator) Signed: 09/07/2015 8:18:35 AM By: Elpidio Eric BSN, RN Entered By: Elpidio Eric on 09/07/2015 08:18:35 Kubisiak, Riker L. ([id number]) -------------------------------------------------------------------------------- Wound Assessment Details Patient Name: Jim [name], Tilden L. Date of Service: 09/07/2015 8:00 AM Medical Record Number: [id number] Patient Account Number: [id number] Date  of Birth/Sex: 1930-12-08 (81 y.o. Male) Treating RN: Afful, RN, BSN, [place] Sink Primary Care Physician: Darreld Mclean Other Clinician: Referring Physician: Darreld Mclean Treating Physician/Extender: Rudene Re in Treatment: 1 Wound Status Wound Number: 3 Primary Diabetic Wound/Ulcer of the Lower Etiology: Extremity Wound Location: Right, Dorsal Toe Fourth Wound Status: Healed - Epithelialized Wounding Event: Shear/Friction Date Acquired: 08/08/2015 Weeks Of Treatment: 1 Clustered Wound: No Photos Photo Uploaded By: Elpidio Eric on 09/07/2015 15:56:08 Wound Measurements Length: (cm) 0 % Reduction Width: (cm) 0 % Reduction Depth: (cm) 0 Area: (cm) 0 Volume: (cm) 0 in Area: 100% in Volume: 100% Wound Description Classification: Grade 0 Periwound Skin Texture Texture Color No Abnormalities Noted: No No Abnormalities Noted: No Moisture No Abnormalities Noted: No Electronic Signature(s) Signed: 09/07/2015 3:56:44 PM By: Elpidio Eric BSN, RN Krager, Gian L. ([id number]) Entered By: Elpidio Eric on 09/07/2015 08:16:51 Samudio, Zhyon L. ([id number]) -------------------------------------------------------------------------------- Vitals Details Patient Name: Jim Pilgrim. Date of Service: 09/07/2015 8:00  AM Medical Record Number: [id number] Patient Account Number: [id number] Date of Birth/Sex: 1930/12/12 (81 y.o. Male) Treating RN: Clover Mealy, RN, BSN, [place] Primary Care Physician: Darreld Mclean Other Clinician: Referring Physician: Darreld Mclean Treating Physician/Extender: Rudene Re in Treatment: 1 Vital Signs Time Taken: 08:11 Temperature (F): 97.8 Height (in): 73 Pulse (bpm): 82 Weight (lbs): 222 Respiratory Rate (breaths/min): 17 Body Mass Index (BMI): 29.3 Blood Pressure (mmHg): 183/126 Reference Range: 80 - 120 mg / dl Electronic Signature(s) Signed: 09/07/2015 8:12:45 AM By: Elpidio Eric BSN, RN Entered By: Elpidio Eric on 09/07/2015 08:12:44

## 2015-09-08 NOTE — Progress Notes (Signed)
Yates, Ignazio L. ([id number]) Visit Report for 09/07/2015 Chief Complaint Document Details Patient Name: Jim [name], [name] Date of Service: 09/07/2015 8:00 AM Medical Record Number: [id number] Patient Account Number: [id number] Date of Birth/Sex: 1930-12-06 (81 y.o. Male) Treating RN: Clover Mealy, RN, BSN, [place] Sink Primary Care Physician: Darreld Mclean Other Clinician: Referring Physician: Darreld Mclean Treating Physician/Extender: Rudene Re in Treatment: 1 Information Obtained from: Patient Chief Complaint Patients presents for treatment of an open diabetic ulcer. The patient noticed some blood on his sock and noticed that he had a ulcerated area on his right forefoot and his right fourth toe. This was about 3 weeks ago. Electronic Signature(s) Signed: 09/07/2015 8:50:56 AM By: Evlyn Kanner MD, FACS Entered By: Evlyn Kanner on 09/07/2015 08:50:56 Raatz, Azim L. ([id number]) -------------------------------------------------------------------------------- Debridement Details Patient Name: Jim [name], [name] Date of Service: 09/07/2015 8:00 AM Medical Record Number: [id number] Patient Account Number: [id number] Date of Birth/Sex: 1930-01-15 (81 y.o. Male) Treating RN: Afful, RN, BSN, Rita Primary Care Physician: Darreld Mclean Other Clinician: Referring Physician: Darreld Mclean Treating Physician/Extender: Rudene Re in Treatment: 1 Debridement Performed for Wound #2 Right,Lateral,Plantar Foot Assessment: Performed By: Physician Tristan Schroeder., MD Debridement: Debridement Pre-procedure Yes Verification/Time Out Taken: Start Time: 08:28 Pain Control: Lidocaine 4% Topical Solution Level: Skin/Subcutaneous Tissue Total Area Debrided (L x 1.2 (cm) x 1.1 (cm) = 1.32 (cm) W): Tissue and other Non-Viable, Callus, Fibrin/Slough, Subcutaneous material debrided: Instrument: Forceps, Scissors Bleeding: Minimum Hemostasis Achieved: Silver Nitrate End Time: 08:33 Procedural Pain:  0 Post Procedural Pain: 0 Response to Treatment: Procedure was tolerated well Post Debridement Measurements of Total Wound Length: (cm) 1.2 Width: (cm) 1.1 Depth: (cm) 0.1 Volume: (cm) 0.104 Post Procedure Diagnosis Same as Pre-procedure Electronic Signature(s) Signed: 09/07/2015 8:50:50 AM By: Evlyn Kanner MD, FACS Signed: 09/07/2015 3:56:44 PM By: Elpidio Eric BSN, RN Previous Signature: 09/07/2015 8:33:38 AM Version By: Elpidio Eric BSN, RN Entered By: Evlyn Kanner on 09/07/2015 08:50:50 Jokerst, Jarnell L. ([id number]) -------------------------------------------------------------------------------- HPI Details Patient Name: Jim [name], [name] Date of Service: 09/07/2015 8:00 AM Medical Record Number: [id number] Patient Account Number: [id number] Date of Birth/Sex: 11-06-1930 (81 y.o. Male) Treating RN: Clover Mealy, RN, BSN, Rita Primary Care Physician: Darreld Mclean Other Clinician: Referring Physician: Darreld Mclean Treating Physician/Extender: Rudene Re in Treatment: 1 History of Present Illness Location: ulcerated area on the right foot plantar aspect and his right fourth toe Quality: Patient reports No Pain. Severity: Patient states wound are getting worse. Duration: Patient has had the wound for < 3 weeks prior to presenting for treatment Context: The wound appeared gradually over time Modifying Factors: Other treatment(s) tried include:the patient has been put on oral Levaquin for 10 days Associated Signs and Symptoms: Patient reports having increase swelling. HPI Description: The patient is a pleasant 81 year old with a past medical history significant for type 2 diabetes and peripheral vascular disease. He underwent a left below-the-knee amputation approximately 10 years ago after stepping on a nail. He has done well since that time and is able to ambulate with a prosthetic. he has had a vascular procedure on his right leg about 4-5 years ago and has never seen the doctor  again. His diabetes is under control and his last hemoglobin A1c was 7.7% measured in January 2016. His other comorbidities are hypertension, cellulitis of the foot, recent urinary tract infection. He was seen earlier for a wound problem about a year ago on his BKA stump. He has been referred back to the wound center by his PCP Dr. Darreld Mclean  for a moderately severe cellulitis of the right foot and right fourth toenail in a gentleman who is known to have PVD and is status post left BKA and has insulin-dependent diabetes mellitus. 09/07/2015 -- the patient has not yet got his appointment to see the vascular doctors and I have urged him to keep this. He's also been having some problems with home health coming to do his dressings and we will work with his agency to synchronize this. Electronic Signature(s) Signed: 09/07/2015 8:52:43 AM By: Evlyn Kanner MD, FACS Entered By: Evlyn Kanner on 09/07/2015 08:52:43 Jim [name], [name] ([id number]) -------------------------------------------------------------------------------- Physical Exam Details Patient Name: Jim [name], [name] Date of Service: 09/07/2015 8:00 AM Medical Record Number: [id number] Patient Account Number: [id number] Date of Birth/Sex: 1930-10-19 (81 y.o. Male) Treating RN: Clover Mealy, RN, BSN, [place] Sink Primary Care Physician: Darreld Mclean Other Clinician: Referring Physician: Darreld Mclean Treating Physician/Extender: Rudene Re in Treatment: 1 Constitutional . Pulse regular. Respirations normal and unlabored. Afebrile. . Eyes Nonicteric. Reactive to light. Ears, Nose, Mouth, and Throat Lips, teeth, and gums WNL.Marland Kitchen Moist mucosa without lesions . Neck supple and nontender. No palpable supraclavicular or cervical adenopathy. Normal sized without goiter. Respiratory WNL. No retractions.. Breath sounds WNL, No rubs, rales, rhonchi, or wheeze.. Cardiovascular Heart rhythm and rate regular, no murmur or gallop.. Pedal Pulses WNL. No  clubbing, cyanosis or edema. Chest Breasts symmetical and no nipple discharge.. Breast tissue WNL, no masses, lumps, or tenderness.. Lymphatic No adneopathy. No adenopathy. No adenopathy. Musculoskeletal Adexa without tenderness or enlargement.. Digits and nails w/o clubbing, cyanosis, infection, petechiae, ischemia, or inflammatory conditions.. Integumentary (Hair, Skin) No suspicious lesions. No crepitus or fluctuance. No peri-wound warmth or erythema. No masses.Marland Kitchen Psychiatric Judgement and insight Intact.. No evidence of depression, anxiety, or agitation.. Notes There is some maceration of the skin around the ulcer and this nature up debridement with forceps and scissors and has been done to satisfaction. Electronic Signature(s) Signed: 09/07/2015 8:53:14 AM By: Evlyn Kanner MD, FACS Entered By: Evlyn Kanner on 09/07/2015 08:53:13 Jim [name], [name] ([id number]) -------------------------------------------------------------------------------- Physician Orders Details Patient Name: Jim Pilgrim. Date of Service: 09/07/2015 8:00 AM Medical Record Number: [id number] Patient Account Number: [id number] Date of Birth/Sex: 01-02-1930 (81 y.o. Male) Treating RN: Clover Mealy, RN, BSN, [place] Sink Primary Care Physician: Darreld Mclean Other Clinician: Referring Physician: Darreld Mclean Treating Physician/Extender: Rudene Re in Treatment: 1 Verbal / Phone Orders: Yes Clinician: Afful, RN, BSN, Rita Read Back and Verified: Yes Diagnosis Coding Wound Cleansing Wound #2 Right,Lateral,Plantar Foot o Clean wound with Normal Saline. Anesthetic Wound #2 Right,Lateral,Plantar Foot o Topical Lidocaine 4% cream applied to wound bed prior to debridement Primary Wound Dressing Wound #2 Right,Lateral,Plantar Foot o Aquacel Ag Secondary Dressing Wound #2 Right,Lateral,Plantar Foot o Gauze and Kerlix/Conform o Foam Dressing Change Frequency Wound #2 Right,Lateral,Plantar Foot o Change  dressing every other day. Follow-up Appointments Wound #2 Right,Lateral,Plantar Foot o Return Appointment in 1 week. Off-Loading Wound #2 Right,Lateral,Plantar Foot o Open toe surgical shoe with peg assist. - darco o Other: - felt Additional Orders / Instructions Wound #2 Right,Lateral,Plantar Foot o Increase protein intake. o Activity as tolerated Jim [name], [name] ([id number]) Electronic Signature(s) Signed: 09/07/2015 8:32:00 AM By: Elpidio Eric BSN, RN Signed: 09/07/2015 4:37:26 PM By: Evlyn Kanner MD, FACS Entered By: Elpidio Eric on 09/07/2015 08:32:00 Fulop, Joell L. ([id number]) -------------------------------------------------------------------------------- Problem List Details Patient Name: Jim [name], Hurman L. Date of Service: 09/07/2015 8:00 AM Medical Record Number: [id number] Patient Account Number: [id number] Date of Birth/Sex: 09/14/1930 (81 y.o. 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 ([id number]) -------------------------------------------------------------------------------- Progress Note Details Patient Name: Jim Pilgrim. Date of Service: 09/07/2015 8:00 AM Medical Record Number: [id number] Patient Account Number: [id number] Date of Birth/Sex: 1930/06/22 (81  y.o. Male) Treating RN: Clover Mealy, RN, BSN, Rita Primary Care Physician: Darreld Mclean Other Clinician: Referring Physician: Darreld Mclean Treating Physician/Extender: Rudene Re in Treatment: 1 Subjective Chief Complaint Information obtained from Patient Patients presents for treatment of an open diabetic ulcer. The patient noticed some blood on his sock and noticed that he had a ulcerated area on his right forefoot and his right fourth toe. This was about 3 weeks ago. History of Present Illness (HPI) The following HPI elements were documented for the patient's wound: Location: ulcerated area on the right foot plantar aspect and his right fourth toe Quality: Patient reports No Pain. Severity: Patient states wound are getting worse. Duration: Patient has had the wound for < 3 weeks prior to presenting for treatment Context: The wound appeared gradually over time Modifying Factors: Other treatment(s) tried include:the patient has been put on oral Levaquin for 10 days Associated Signs and Symptoms: Patient reports having increase swelling. The patient is a pleasant 81 year old with a past medical history significant for type 2 diabetes and peripheral vascular disease. He underwent a left below-the-knee amputation approximately 10 years ago after stepping on a nail. He has done well since that time and is able to ambulate with a prosthetic. he has had a vascular procedure on his right leg about 4-5 years ago and has never seen the doctor again. His diabetes is under control and his last hemoglobin A1c was 7.7% measured in January 2016. His other comorbidities are hypertension, cellulitis of the foot, recent urinary tract infection. He was seen earlier for a wound problem about a year ago on his BKA stump. He has been referred back to the wound center by his PCP Dr. Darreld Mclean for a moderately severe cellulitis of the right foot and right fourth toenail in a gentleman who is known to  have PVD and is status post left BKA and has insulin-dependent diabetes mellitus. 09/07/2015 -- the patient has not yet got his appointment to see the vascular doctors and I have urged him to keep this. He's also been having some problems with home health coming to do his dressings and we will work with his agency to synchronize this. Objective Jim [name], [name] ([id number]) Constitutional Pulse regular. Respirations normal and unlabored. Afebrile. Vitals Time Taken: 8:11 AM, Height: 73 in, Weight: 222 lbs, BMI: 29.3, Temperature: 97.8 F, Pulse: 82 bpm, Respiratory Rate: 17 breaths/min, Blood Pressure: 183/126 mmHg. Eyes Nonicteric. Reactive to light. Ears, Nose, Mouth, and Throat Lips, teeth, and gums WNL.Marland Kitchen Moist mucosa without lesions . Neck supple and nontender. No palpable supraclavicular or cervical adenopathy. Normal sized without goiter. Respiratory WNL. No retractions.. Breath sounds WNL, No rubs, rales, rhonchi, or wheeze.. Cardiovascular Heart rhythm and rate  regular, no murmur or gallop.. Pedal Pulses WNL. No clubbing, cyanosis or edema. Chest Breasts symmetical and no nipple discharge.. Breast tissue WNL, no masses, lumps, or tenderness.. Lymphatic No adneopathy. No adenopathy. No adenopathy. Musculoskeletal Adexa without tenderness or enlargement.. Digits and nails w/o clubbing, cyanosis, infection, petechiae, ischemia, or inflammatory conditions.Marland Kitchen Psychiatric Judgement and insight Intact.. No evidence of depression, anxiety, or agitation.. General Notes: There is some maceration of the skin around the ulcer and this nature up debridement with forceps and scissors and has been done to satisfaction. Integumentary (Hair, Skin) No suspicious lesions. No crepitus or fluctuance. No peri-wound warmth or erythema. No masses.. Wound #2 status is Open. Original cause of wound was Footwear Injury. The wound is located on the Right,Lateral,Plantar Foot. The wound measures 1.2cm  length x 1.1cm width x 0.2cm depth; 1.037cm^2 area and 0.207cm^3 volume. The wound is limited to skin breakdown. There is no tunneling or undermining noted. There is a small amount of serosanguineous drainage noted. The wound margin is thickened. There is medium (34-66%) pink, pale granulation within the wound bed. There is a small (1-33%) amount of necrotic tissue within the wound bed including Adherent Slough. The periwound skin appearance exhibited: Callus, Moist. The periwound skin appearance did not exhibit: Crepitus, Excoriation, Fluctuance, Friable, Induration, Localized Edema, Rash, Scarring, Dry/Scaly, Maceration, Atrophie Blanche, Cyanosis, Woodburn, Arron L. ([id number]) Ecchymosis, Hemosiderin Staining, Mottled, Pallor, Rubor, Erythema. Periwound temperature was noted as No Abnormality. Wound #3 status is Healed - Epithelialized. Original cause of wound was Shear/Friction. The wound is located on the Right,Dorsal Toe Fourth. The wound measures 0cm length x 0cm width x 0cm depth; 0cm^2 area and 0cm^3 volume. Assessment Active Problems ICD-10 E11.621 - Type 2 diabetes mellitus with foot ulcer Z89.512 - Acquired absence of left leg below knee I70.25 - Atherosclerosis of native arteries of other extremities with ulceration L97.512 - Non-pressure chronic ulcer of other part of right foot with fat layer exposed I have recommended silver alginate and a bordered foam dressing over this and he is with a wheelchair most of the day and has good offloading. I have urged him to see the vascular surgeons as soon as possible and come back to see as an regular basis. Procedures Wound #2 Wound #2 is a Diabetic Wound/Ulcer of the Lower Extremity located on the Right,Lateral,Plantar Foot . There was a Skin/Subcutaneous Tissue Debridement ([id number]) debridement with total area of 1.32 sq cm performed by Namira Rosekrans, Ignacia Felling., MD. with the following instrument(s): Forceps and Scissors to remove  Non- Viable tissue/material including Fibrin/Slough, Callus, and Subcutaneous after achieving pain control using Lidocaine 4% Topical Solution. A time out was conducted prior to the start of the procedure. A Minimum amount of bleeding was controlled with Silver Nitrate. The procedure was tolerated well with a pain level of 0 throughout and a pain level of 0 following the procedure. Post Debridement Measurements: 1.2cm length x 1.1cm width x 0.1cm depth; 0.104cm^3 volume. Post procedure Diagnosis Wound #2: Same as Pre-Procedure Plan Jim [name], [name] ([id number]) Wound Cleansing: Wound #2 Right,Lateral,Plantar Foot: Clean wound with Normal Saline. Anesthetic: Wound #2 Right,Lateral,Plantar Foot: Topical Lidocaine 4% cream applied to wound bed prior to debridement Primary Wound Dressing: Wound #2 Right,Lateral,Plantar Foot: Aquacel Ag Secondary Dressing: Wound #2 Right,Lateral,Plantar Foot: Gauze and Kerlix/Conform Foam Dressing Change Frequency: Wound #2 Right,Lateral,Plantar Foot: Change dressing every other day. Follow-up Appointments: Wound #2 Right,Lateral,Plantar Foot: Return Appointment in 1 week. Off-Loading: Wound #2 Right,Lateral,Plantar Foot: Open toe surgical shoe with peg assist. - darco  Other: - felt Additional Orders / Instructions: Wound #2 Right,Lateral,Plantar Foot: Increase protein intake. Activity as tolerated I have recommended silver alginate and a bordered foam dressing over this and he is with a wheelchair most of the day and has good offloading. I have urged him to see the vascular surgeons as soon as possible and come back to see as an regular basis. Electronic Signature(s) Signed: 09/07/2015 8:53:48 AM By: Evlyn Kanner MD, FACS Entered By: Evlyn Kanner on 09/07/2015 08:53:48 Jim [name], Nikos L. ([id number]) -------------------------------------------------------------------------------- SuperBill Details Patient Name: Jim [name], Rohen L. Date of Service:  09/07/2015 Medical Record Number: [id number] Patient Account Number: [id number] Date of Birth/Sex: Jun 27, 1930 (81 y.o. Male) Treating RN: Afful, RN, BSN, Rita Primary Care Physician: Darreld Mclean Other Clinician: Referring Physician: Darreld Mclean Treating Physician/Extender: Rudene Re in Treatment: 1 Diagnosis Coding ICD-10 Codes Code Description E11.621 Type 2 diabetes mellitus with foot ulcer Z89.512 Acquired absence of left leg below knee I70.25 Atherosclerosis of native arteries of other extremities with ulceration L97.512 Non-pressure chronic ulcer of other part of right foot with fat layer exposed Facility Procedures CPT4 Code Description: [id number] 11042 - DEB SUBQ TISSUE 20 SQ CM/< ICD-10 Description Diagnosis E11.621 Type 2 diabetes mellitus with foot ulcer Z89.512 Acquired absence of left leg below knee I70.25 Atherosclerosis of native arteries of other extremiti  L97.512 Non-pressure chronic ulcer of other part of right foo Modifier: es with ulcer t with fat la Quantity: 1 ation yer exposed Physician Procedures CPT4 Code Description: [id number] 11042 - WC PHYS SUBQ TISS 20 SQ CM ICD-10 Description Diagnosis E11.621 Type 2 diabetes mellitus with foot ulcer Z89.512 Acquired absence of left leg below knee I70.25 Atherosclerosis of native arteries of other extremiti  L97.512 Non-pressure chronic ulcer of other part of right foo Modifier: es with ulcera t with fat lay Quantity: 1 tion er exposed Electronic Signature(s) Signed: 09/07/2015 8:53:58 AM By: Evlyn Kanner MD, FACS Entered By: Evlyn Kanner on 09/07/2015 08:53:58

## 2015-09-14 ENCOUNTER — Encounter: Payer: Medicare HMO | Admitting: Surgery

## 2015-09-14 DIAGNOSIS — L97512 Non-pressure chronic ulcer of other part of right foot with fat layer exposed: Secondary | ICD-10-CM | POA: Diagnosis not present

## 2015-09-15 NOTE — Progress Notes (Signed)
[name], [name] ([id number]) Visit Report for 09/14/2015 Arrival Information Details Patient Name: Jim [name], [name] Date of Service: 09/14/2015 8:45 AM Medical Record Number: [id number] Patient Account Number: [id number] Date of Birth/Sex: 04-14-1930 (81 y.o. Male) Treating RN: Huel Coventry Primary Care Physician: Darreld Mclean Other Clinician: Referring Physician: Darreld Mclean Treating Physician/Extender: Rudene Re in Treatment: 2 Visit Information History Since Last Visit Added or deleted any medications: No Patient Arrived: Wheel Chair Any new allergies or adverse reactions: No Arrival Time: 08:41 Had a fall or experienced change in No activities of daily living that may affect Accompanied By: self risk of falls: Transfer Assistance: Manual Signs or symptoms of abuse/neglect since last No Patient Identification Verified: Yes visito Secondary Verification Process Yes Hospitalized since last visit: No Completed: Has Dressing in Place as Prescribed: Yes Patient Requires Transmission-Based No Pain Present Now: No Precautions: Patient Has Alerts: No Electronic Signature(s) Signed: 09/14/2015 5:32:35 PM By: Elliot Gurney, RN, BSN, Kim RN, BSN Entered By: Elliot Gurney, RN, BSN, Kim on 09/14/2015 08:42:10 [name], [name] ([id number]) -------------------------------------------------------------------------------- Encounter Discharge Information Details Patient Name: Jim [name], [name] Date of Service: 09/14/2015 8:45 AM Medical Record Number: [id number] Patient Account Number: [id number] Date of Birth/Sex: 10-28-1930 (81 y.o. Male) Treating RN: Huel Coventry Primary Care Physician: Darreld Mclean Other Clinician: Referring Physician: Darreld Mclean Treating Physician/Extender: Rudene Re in Treatment: 2 Encounter Discharge Information Items Discharge Pain Level: 0 Discharge Condition: Stable Ambulatory Status: Wheelchair Discharge Destination: Home Transportation: Private  Auto Accompanied By: self Schedule Follow-up Appointment: Yes Medication Reconciliation completed and provided to Patient/Care Yes Provider: Provided on Clinical Summary of Care: 09/14/2015 Form Type Recipient Paper Patient [facility] Electronic Signature(s) Signed: 09/14/2015 5:32:35 PM By: Elliot Gurney, RN, BSN, Kim RN, BSN Previous Signature: 09/14/2015 9:06:07 AM Version By: Gwenlyn Perking Entered By: Elliot Gurney RN, BSN, Kim on 09/14/2015 09:10:37 [name], [name] ([id number]) -------------------------------------------------------------------------------- Lower Extremity Assessment Details Patient Name: Jim [name], [name] Date of Service: 09/14/2015 8:45 AM Medical Record Number: [id number] Patient Account Number: [id number] Date of Birth/Sex: 12/07/1930 (81 y.o. Male) Treating RN: Huel Coventry Primary Care Physician: Darreld Mclean Other Clinician: Referring Physician: Darreld Mclean Treating Physician/Extender: Rudene Re in Treatment: 2 Vascular Assessment Pulses: Posterior Tibial Dorsalis Pedis Palpable: [Left:No] Doppler: [Left:Monophasic] Extremity colors, hair growth, and conditions: Extremity Color: [Left:Hyperpigmented] Hair Growth on Extremity: [Left:No] Temperature of Extremity: [Left:Cool] Capillary Refill: [Left:< 3 seconds] Toe Nail Assessment Left: Right: Thick: Yes Discolored: Yes Deformed: No Improper Length and Hygiene: No Electronic Signature(s) Signed: 09/14/2015 5:32:35 PM By: Elliot Gurney, RN, BSN, Kim RN, BSN Entered By: Elliot Gurney, RN, BSN, Kim on 09/14/2015 08:47:21 [name], [name] ([id number]) -------------------------------------------------------------------------------- Multi Wound Chart Details Patient Name: Jim Pilgrim. Date of Service: 09/14/2015 8:45 AM Medical Record Number: [id number] Patient Account Number: [id number] Date of Birth/Sex: 1930/03/13 (81 y.o. Male) Treating RN: Huel Coventry Primary Care Physician: Darreld Mclean Other Clinician: Referring  Physician: Darreld Mclean Treating Physician/Extender: Rudene Re in Treatment: 2 Vital Signs Height(in): 73 Pulse(bpm): 67 Weight(lbs): 222 Blood Pressure 123/77 (mmHg): Body Mass Index(BMI): 29 Temperature(F): 97.8 Respiratory Rate 18 (breaths/min): Photos: [2:No Photos] [N/A:N/A] Wound Location: [2:Right Foot - Plantar, Lateral] [N/A:N/A] Wounding Event: [2:Footwear Injury] [N/A:N/A] Primary Etiology: [2:Diabetic Wound/Ulcer of the Lower Extremity] [N/A:N/A] Comorbid History: [2:Cataracts, Hypertension, Type II Diabetes, Osteoarthritis] [N/A:N/A] Date Acquired: [2:08/08/2015] [N/A:N/A] Weeks of Treatment: [2:2] [N/A:N/A] Wound Status: [2:Open] [N/A:N/A] Measurements L x W x D 1.2x1.1x0.2 [N/A:N/A] (cm) Area (cm) : [2:1.037] [N/A:N/A] Volume (cm) : [2:0.207] [N/A:N/A] % Reduction in Area: [2:-10.10%] [N/A:N/A] % Reduction  in Volume: -10.10% [N/A:N/A] Classification: [2:Grade 1] [N/A:N/A] Exudate Amount: [2:Small] [N/A:N/A] Exudate Type: [2:Serosanguineous] [N/A:N/A] Exudate Color: [2:red, brown] [N/A:N/A] Wound Margin: [2:Thickened] [N/A:N/A] Granulation Amount: [2:Medium (34-66%)] [N/A:N/A] Granulation Quality: [2:Pink, Pale] [N/A:N/A] Necrotic Amount: [2:Small (1-33%)] [N/A:N/A] Exposed Structures: [2:Fascia: No Fat: No Tendon: No Muscle: No] [N/A:N/A] Joint: No Bone: No Limited to Skin Breakdown Epithelialization: None N/A N/A Periwound Skin Texture: Callus: Yes N/A N/A Edema: No Excoriation: No Induration: No Crepitus: No Fluctuance: No Friable: No Rash: No Scarring: No Periwound Skin Moist: Yes N/A N/A Moisture: Maceration: No Dry/Scaly: No Periwound Skin Color: Atrophie Blanche: No N/A N/A Cyanosis: No Ecchymosis: No Erythema: No Hemosiderin Staining: No Mottled: No Pallor: No Rubor: No Temperature: No Abnormality N/A N/A Tenderness on No N/A N/A Palpation: Wound Preparation: Ulcer Cleansing: N/A N/A Rinsed/Irrigated  with Saline Topical Anesthetic Applied: Other: lidocaine 4% Treatment Notes Electronic Signature(s) Signed: 09/14/2015 5:32:35 PM By: Elliot Gurney, RN, BSN, Kim RN, BSN Entered By: Elliot Gurney, RN, BSN, Kim on 09/14/2015 08:52:40 Jim [name], [name] ([id number]) -------------------------------------------------------------------------------- Multi-Disciplinary Care Plan Details Patient Name: Jim [name], Buford L. Date of Service: 09/14/2015 8:45 AM Medical Record Number: [id number] Patient Account Number: [id number] Date of Birth/Sex: December 16, 1930 (81 y.o. Male) Treating RN: Huel Coventry Primary Care Physician: Darreld Mclean Other Clinician: Referring Physician: Darreld Mclean Treating Physician/Extender: Rudene Re in Treatment: 2 Active Inactive Abuse / Safety / Falls / Self Care Management Nursing Diagnoses: Impaired home maintenance Impaired physical mobility Knowledge deficit related to: safety; personal, health (wound), emergency Potential for falls Self care deficit: actual or potential Goals: Patient will remain injury free Date Initiated: 08/30/2015 Goal Status: Active Patient/caregiver will verbalize understanding of skin care regimen Date Initiated: 08/30/2015 Goal Status: Active Patient/caregiver will verbalize understanding of the importance to maintain current immunizations/vaccinations Date Initiated: 08/30/2015 Goal Status: Active Patient/caregiver will verbalize/demonstrate measures taken to improve the patient's personal safety Date Initiated: 08/30/2015 Goal Status: Active Patient/caregiver will verbalize/demonstrate measures taken to prevent injury and/or falls Date Initiated: 08/30/2015 Goal Status: Active Patient/caregiver will verbalize/demonstrate understanding of what to do in case of emergency Date Initiated: 08/30/2015 Goal Status: Active Interventions: Provide education on basic hygiene Provide education on fall prevention Provide education on personal and home  safety Provide education on safe transfers Hufstetler, Keymarion L. ([id number]) Treatment Activities: Education provided on Basic Hygiene : 08/30/2015 Notes: Orientation to the Wound Care Program Nursing Diagnoses: Knowledge deficit related to the wound healing center program Goals: Patient/caregiver will verbalize understanding of the Wound Healing Center Program Date Initiated: 08/30/2015 Goal Status: Active Interventions: Provide education on orientation to the wound center Notes: Wound/Skin Impairment Nursing Diagnoses: Impaired tissue integrity Knowledge deficit related to ulceration/compromised skin integrity Goals: Patient/caregiver will verbalize understanding of skin care regimen Date Initiated: 08/30/2015 Goal Status: Active Ulcer/skin breakdown will have a volume reduction of 50% by week 8 Date Initiated: 08/30/2015 Goal Status: Active Ulcer/skin breakdown will heal within 14 weeks Date Initiated: 08/30/2015 Goal Status: Active Interventions: Assess patient/caregiver ability to obtain necessary supplies Assess patient/caregiver ability to perform ulcer/skin care regimen upon admission and as needed Assess ulceration(s) every visit Provide education on ulcer and skin care Treatment Activities: Skin care regimen initiated : 09/14/2015 Topical wound management initiated : 09/14/2015 Jim [name], [name] ([id number]) Notes: Electronic Signature(s) Signed: 09/14/2015 5:32:35 PM By: Elliot Gurney, RN, BSN, Kim RN, BSN Entered By: Elliot Gurney, RN, BSN, Kim on 09/14/2015 08:52:33 Jim [name], [name] ([id number]) -------------------------------------------------------------------------------- Patient/Caregiver Education Details Patient Name: Jim Pilgrim. Date of Service: 09/14/2015 8:45 AM Medical Record Number: [id number] Patient Account  Number: [id number] Date of Birth/Gender: September 21, 1930 (81 y.o. Male) Treating RN: Huel Coventry Primary Care Physician: Darreld Mclean Other Clinician: Referring Physician:  Darreld Mclean Treating Physician/Extender: Rudene Re in Treatment: 2 Education Assessment Education Provided To: Patient Education Topics Provided Wound/Skin Impairment: Handouts: Caring for Your Ulcer, Other: continue wound care as prescribed Electronic Signature(s) Signed: 09/14/2015 5:32:35 PM By: Elliot Gurney, RN, BSN, Kim RN, BSN Entered By: Elliot Gurney, RN, BSN, Kim on 09/14/2015 09:10:52 Jim [name], [name] ([id number]) -------------------------------------------------------------------------------- Wound Assessment Details Patient Name: Jim [name], [name] Date of Service: 09/14/2015 8:45 AM Medical Record Number: [id number] Patient Account Number: [id number] Date of Birth/Sex: 11/15/1930 (81 y.o. Male) Treating RN: Huel Coventry Primary Care Physician: Darreld Mclean Other Clinician: Referring Physician: Darreld Mclean Treating Physician/Extender: Rudene Re in Treatment: 2 Wound Status Wound Number: 2 Primary Diabetic Wound/Ulcer of the Lower Etiology: Extremity Wound Location: Right Foot - Plantar, Lateral Wound Open Wounding Event: Footwear Injury Status: Date Acquired: 08/08/2015 Comorbid Cataracts, Hypertension, Type II Weeks Of Treatment: 2 History: Diabetes, Osteoarthritis Clustered Wound: No Photos Photo Uploaded By: Elliot Gurney, RN, BSN, Kim on 09/14/2015 15:09:40 Wound Measurements Length: (cm) 1.2 Width: (cm) 1.1 Depth: (cm) 0.2 Area: (cm) 1.037 Volume: (cm) 0.207 % Reduction in Area: -10.1% % Reduction in Volume: -10.1% Epithelialization: None Wound Description Classification: Grade 1 Foul Odor Af Wound Margin: Thickened Exudate Amount: Small Exudate Type: Serosanguineous Exudate Color: red, brown ter Cleansing: No Wound Bed Granulation Amount: Medium (34-66%) Exposed Structure Granulation Quality: Pink, Pale Fascia Exposed: No Necrotic Amount: Small (1-33%) Fat Layer Exposed: No Necrotic Quality: Adherent Slough Tendon Exposed: No Jim [name], [name]  ([id number]) Muscle Exposed: No Joint Exposed: No Bone Exposed: No Limited to Skin Breakdown Periwound Skin Texture Texture Color No Abnormalities Noted: No No Abnormalities Noted: No Callus: Yes Atrophie Blanche: No Crepitus: No Cyanosis: No Excoriation: No Ecchymosis: No Fluctuance: No Erythema: No Friable: No Hemosiderin Staining: No Induration: No Mottled: No Localized Edema: No Pallor: No Rash: No Rubor: No Scarring: No Temperature / Pain Moisture Temperature: No Abnormality No Abnormalities Noted: No Dry / Scaly: No Maceration: No Moist: Yes Wound Preparation Ulcer Cleansing: Rinsed/Irrigated with Saline Topical Anesthetic Applied: Other: lidocaine 4%, Treatment Notes Wound #2 (Right, Lateral, Plantar Foot) 1. Cleansed with: Clean wound with Normal Saline 2. Anesthetic Topical Lidocaine 4% cream to wound bed prior to debridement 4. Dressing Applied: Aquacel Ag 5. Secondary Dressing Applied Gauze and Kerlix/Conform Notes felt surrounding wound for off-loading Electronic Signature(s) Signed: 09/14/2015 5:32:35 PM By: Elliot Gurney, RN, BSN, Kim RN, BSN Entered By: Elliot Gurney, RN, BSN, Kim on 09/14/2015 08:49:42 Jim [name], [name] ([id number]) -------------------------------------------------------------------------------- Vitals Details Patient Name: Jim [name], [name] Date of Service: 09/14/2015 8:45 AM Medical Record Number: [id number] Patient Account Number: [id number] Date of Birth/Sex: [birth year] (81 y.o. Male) Treating RN: Huel Coventry Primary Care Physician: Darreld Mclean Other Clinician: Referring Physician: Darreld Mclean Treating Physician/Extender: Rudene Re in Treatment: 2 Vital Signs Time Taken: 08:43 Temperature (F): 97.8 Height (in): 73 Pulse (bpm): 67 Weight (lbs): 222 Respiratory Rate (breaths/min): 18 Body Mass Index (BMI): 29.3 Blood Pressure (mmHg): 123/77 Reference Range: 80 - 120 mg / dl Electronic Signature(s) Signed: 09/14/2015  5:32:35 PM By: Elliot Gurney, RN, BSN, Kim RN, BSN Entered By: Elliot Gurney, RN, BSN, Kim on 09/14/2015 08:44:39

## 2015-09-15 NOTE — Progress Notes (Signed)
Kessinger, Jane L. ([id number]) Visit Report for 09/14/2015 Chief Complaint Document Details Patient Name: Jim [name], [name] Date of Service: 09/14/2015 8:45 AM Medical Record Number: [id number] Patient Account Number: [id number] Date of Birth/Sex: 10-Mar-1930 (81 y.o. Male) Treating [name]: Jim [name] Primary Care Physician: Jim [name] Other Clinician: Referring Physician: Darreld [name] Treating Physician/Extender: Jim [name] in Treatment: 2 Information Obtained from: Patient Chief Complaint Patients presents for treatment of an open diabetic ulcer. The patient noticed some blood on his sock and noticed that he had a ulcerated area on his right forefoot and his right fourth toe. This was about 3 weeks ago. Electronic Signature(s) Signed: 09/14/2015 9:04:45 AM By: Jim Kanner MD, FACS Entered By: Jim [name] on 09/14/2015 09:04:45 Pooler, [name] ([id number]) -------------------------------------------------------------------------------- Debridement Details Patient Name: Jim [name], [name] Date of Service: 09/14/2015 8:45 AM Medical Record Number: [id number] Patient Account Number: [id number] Date of Birth/Sex: 1930-02-12 (81 y.o. Male) Treating [name]: Jim [name] Primary Care Physician: Jim [name] Other Clinician: Referring Physician: Darreld [name] Treating Physician/Extender: Jim [name] in Treatment: 2 Debridement Performed for Wound #2 Right,Lateral,Plantar Foot Assessment: Performed By: Physician Jim [name]., MD Debridement: Debridement Pre-procedure No Verification/Time Out Taken: Start Time: 08:55 Pain Control: Other : lidocaine 4% Level: Skin/Subcutaneous Tissue Total Area Debrided (L x 1.2 (cm) x 1.1 (cm) = 1.32 (cm) W): Tissue and other Viable, Non-Viable, Exudate, Subcutaneous material debrided: Instrument: Curette Bleeding: Moderate Hemostasis Achieved: Pressure End Time: 08:57 Procedural Pain: 0 Post Procedural Pain: 0 Response to Treatment:  Procedure was tolerated well Post Debridement Measurements of Total Wound Length: (cm) 1.2 Width: (cm) 1.1 Depth: (cm) 0.2 Volume: (cm) 0.207 Post Procedure Diagnosis Same as Pre-procedure Electronic Signature(s) Signed: 09/14/2015 9:04:39 AM By: Jim Kanner MD, FACS Signed: 09/14/2015 5:32:35 PM By: Jim Gurney [name], BSN, Jim [name], BSN Entered By: Jim [name] on 09/14/2015 09:04:39 Minihan, [name] ([id number]) -------------------------------------------------------------------------------- HPI Details Patient Name: Jim [name], [name] Date of Service: 09/14/2015 8:45 AM Medical Record Number: [id number] Patient Account Number: [id number] Date of Birth/Sex: [birth year] (81 y.o. Male) Treating [name]: Jim [name] Primary Care Physician: Jim [name] Other Clinician: Referring Physician: Darreld [name] Treating Physician/Extender: Jim [name] in Treatment: 2 History of Present Illness Location: ulcerated area on the right foot plantar aspect and his right fourth toe Quality: Patient reports No Pain. Severity: Patient states wound are getting worse. Duration: Patient has had the wound for < 3 weeks prior to presenting for treatment Context: The wound appeared gradually over time Modifying Factors: Other treatment(s) tried include:the patient has been put on oral Levaquin for 10 days Associated Signs and Symptoms: Patient reports having increase swelling. HPI Description: The patient is a pleasant 81 year old with a past medical history significant for type 2 diabetes and peripheral vascular disease. He underwent a left below-the-knee amputation approximately 10 years ago after stepping on a nail. He has done well since that time and is able to ambulate with a prosthetic. He has had a vascular procedure on his right leg about 4-5 years ago and has never seen the doctor again. His diabetes is under control and his last hemoglobin A1c was 7.7% measured in January 2016. His other comorbidities  are hypertension, cellulitis of the foot, recent urinary tract infection. He was seen earlier for a wound problem about a year ago on his BKA stump. He has been referred back to the wound center by his PCP Dr. Darreld [name] for a moderately severe cellulitis of the right foot and right fourth toenail in a gentleman who  is known to have PVD and is status post left BKA and has insulin-dependent diabetes mellitus. 09/07/2015 -- the patient has not yet got his appointment to see the vascular doctors and I have urged him to keep this. He's also been having some problems with home health coming to do his dressings and we will work with his agency to synchronize this. 09/14/2015 -- he has an appointment to see the vascular surgeons and Monday. Electronic Signature(s) Signed: 09/14/2015 9:05:28 AM By: Jim Kanner MD, FACS Entered By: Jim [name] on 09/14/2015 09:05:28 Toscano, [name] ([id number]) -------------------------------------------------------------------------------- Physical Exam Details Patient Name: Jim Pilgrim. Date of Service: 09/14/2015 8:45 AM Medical Record Number: [id number] Patient Account Number: [id number] Date of Birth/Sex: 08-07-1930 (81 y.o. Male) Treating [name]: Jim [name] Primary Care Physician: Jim [name] Other Clinician: Referring Physician: Darreld [name] Treating Physician/Extender: Jim [name] in Treatment: 2 Constitutional . Pulse regular. Respirations normal and unlabored. Afebrile. . Eyes Nonicteric. Reactive to light. Ears, Nose, Mouth, and Throat Lips, teeth, and gums WNL.Marland Kitchen Moist mucosa without lesions . Neck supple and nontender. No palpable supraclavicular or cervical adenopathy. Normal sized without goiter. Respiratory WNL. No retractions.. Cardiovascular Pedal Pulses WNL. No clubbing, cyanosis or edema. Chest Breasts symmetical and no nipple discharge.. Breast tissue WNL, no masses, lumps, or tenderness.. Lymphatic No adneopathy. No  adenopathy. No adenopathy. Musculoskeletal Adexa without tenderness or enlargement.. Digits and nails w/o clubbing, cyanosis, infection, petechiae, ischemia, or inflammatory conditions.. Integumentary (Hair, Skin) No suspicious lesions. No crepitus or fluctuance. No peri-wound warmth or erythema. No masses.Marland Kitchen Psychiatric Judgement and insight Intact.. No evidence of depression, anxiety, or agitation.. Notes There is callus buildup around the ulceration and there is some slough on the DIPs of the ulcer which would be sharply debrided with a curette Electronic Signature(s) Signed: 09/14/2015 9:05:55 AM By: Jim Kanner MD, FACS Entered By: Jim [name] on 09/14/2015 09:05:54 Jim [name], [name] ([id number]) -------------------------------------------------------------------------------- Physician Orders Details Patient Name: Jim Pilgrim. Date of Service: 09/14/2015 8:45 AM Medical Record Number: [id number] Patient Account Number: [id number] Date of Birth/Sex: 1930/12/19 (81 y.o. Male) Treating [name]: Jim [name] Primary Care Physician: Jim [name] Other Clinician: Referring Physician: Darreld [name] Treating Physician/Extender: Jim [name] in Treatment: 2 Verbal / Phone Orders: Yes Clinician: Huel [name] Read Back and Verified: Yes Diagnosis Coding Wound Cleansing Wound #2 Right,Lateral,Plantar Foot o Clean wound with Normal Saline. Anesthetic Wound #2 Right,Lateral,Plantar Foot o Topical Lidocaine 4% cream applied to wound bed prior to debridement Primary Wound Dressing Wound #2 Right,Lateral,Plantar Foot o Aquacel Ag - felt surrounding wound for off-loading Secondary Dressing Wound #2 Right,Lateral,Plantar Foot o Gauze and Kerlix/Conform o Foam Dressing Change Frequency Wound #2 Right,Lateral,Plantar Foot o Change dressing every other day. Follow-up Appointments Wound #2 Right,Lateral,Plantar Foot o Return Appointment in 1 week. Off-Loading Wound #2  Right,Lateral,Plantar Foot o Open toe surgical shoe with peg assist. - darco o Other: - felt Additional Orders / Instructions Wound #2 Right,Lateral,Plantar Foot o Increase protein intake. o Activity as tolerated Jim [name], [name] ([id number]) Electronic Signature(s) Signed: 09/14/2015 4:45:33 PM By: Jim Kanner MD, FACS Signed: 09/14/2015 5:32:35 PM By: Jim Gurney [name], BSN, Jim [name], BSN Entered By: Jim Gurney, [name], BSN, Jim on 09/14/2015 09:09:31 Jim [name], [name] ([id number]) -------------------------------------------------------------------------------- Problem List Details Patient Name: Jim [name], Leotha L. Date of Service: 09/14/2015 8:45 AM Medical Record Number: [id number] Patient Account Number: [id number] Date of Birth/Sex: May 02, 1930 (81 y.o. Male) Treating [name]: Jim [name] Primary Care Physician: Jim [name] Other Clinician: Referring Physician: Darreld [name] Treating Physician/Extender: Meyer Russel,  Errol Weeks in Treatment: 2 Active Problems ICD-10 Encounter Code Description Active Date Diagnosis E11.621 Type 2 diabetes mellitus with foot ulcer 08/30/2015 Yes Z89.512 Acquired absence of left leg below knee 08/30/2015 Yes I70.25 Atherosclerosis of native arteries of other extremities with 08/30/2015 Yes ulceration L97.512 Non-pressure chronic ulcer of other part of right foot with 08/30/2015 Yes fat layer exposed Inactive Problems Resolved Problems Electronic Signature(s) Signed: 09/14/2015 9:04:30 AM By: Jim Kanner MD, FACS Entered By: Jim [name] on 09/14/2015 09:04:30 Hynson, Kasson L. ([id number]) -------------------------------------------------------------------------------- Progress Note Details Patient Name: Jim Pilgrim. Date of Service: 09/14/2015 8:45 AM Medical Record Number: [id number] Patient Account Number: [id number] Date of Birth/Sex: 1930/04/21 (81 y.o. Male) Treating [name]: Jim [name] Primary Care Physician: Jim [name] Other Clinician: Referring Physician: Darreld [name] Treating Physician/Extender: Jim [name] in Treatment: 2 Subjective Chief Complaint Information obtained from Patient Patients presents for treatment of an open diabetic ulcer. The patient noticed some blood on his sock and noticed that he had a ulcerated area on his right forefoot and his right fourth toe. This was about 3 weeks ago. History of Present Illness (HPI) The following HPI elements were documented for the patient's wound: Location: ulcerated area on the right foot plantar aspect and his right fourth toe Quality: Patient reports No Pain. Severity: Patient states wound are getting worse. Duration: Patient has had the wound for < 3 weeks prior to presenting for treatment Context: The wound appeared gradually over time Modifying Factors: Other treatment(s) tried include:the patient has been put on oral Levaquin for 10 days Associated Signs and Symptoms: Patient reports having increase swelling. The patient is a pleasant 81 year old with a past medical history significant for type 2 diabetes and peripheral vascular disease. He underwent a left below-the-knee amputation approximately 10 years ago after stepping on a nail. He has done well since that time and is able to ambulate with a prosthetic. He has had a vascular procedure on his right leg about 4-5 years ago and has never seen the doctor again. His diabetes is under control and his last hemoglobin A1c was 7.7% measured in January 2016. His other comorbidities are hypertension, cellulitis of the foot, recent urinary tract infection. He was seen earlier for a wound problem about a year ago on his BKA stump. He has been referred back to the wound center by his PCP Dr. Darreld [name] for a moderately severe cellulitis of the right foot and right fourth toenail in a gentleman who is known to have PVD and is status post left BKA and has insulin-dependent diabetes mellitus. 09/07/2015 -- the patient has not yet got his  appointment to see the vascular doctors and I have urged him to keep this. He's also been having some problems with home health coming to do his dressings and we will work with his agency to synchronize this. 09/14/2015 -- he has an appointment to see the vascular surgeons and Monday. Objective Jim [name], [name] ([id number]) Constitutional Pulse regular. Respirations normal and unlabored. Afebrile. Vitals Time Taken: 8:43 AM, Height: 73 in, Weight: 222 lbs, BMI: 29.3, Temperature: 97.8 F, Pulse: 67 bpm, Respiratory Rate: 18 breaths/min, Blood Pressure: 123/77 mmHg. Eyes Nonicteric. Reactive to light. Ears, Nose, Mouth, and Throat Lips, teeth, and gums WNL.Marland Kitchen Moist mucosa without lesions . Neck supple and nontender. No palpable supraclavicular or cervical adenopathy. Normal sized without goiter. Respiratory WNL. No retractions.. Cardiovascular Pedal Pulses WNL. No clubbing, cyanosis or edema. Chest Breasts symmetical and no nipple discharge.. Breast tissue WNL, no  masses, lumps, or tenderness.. Lymphatic No adneopathy. No adenopathy. No adenopathy. Musculoskeletal Adexa without tenderness or enlargement.. Digits and nails w/o clubbing, cyanosis, infection, petechiae, ischemia, or inflammatory conditions.Marland Kitchen Psychiatric Judgement and insight Intact.. No evidence of depression, anxiety, or agitation.. General Notes: There is callus buildup around the ulceration and there is some slough on the DIPs of the ulcer which would be sharply debrided with a curette Integumentary (Hair, Skin) No suspicious lesions. No crepitus or fluctuance. No peri-wound warmth or erythema. No masses.. Wound #2 status is Open. Original cause of wound was Footwear Injury. The wound is located on the Right,Lateral,Plantar Foot. The wound measures 1.2cm length x 1.1cm width x 0.2cm depth; 1.037cm^2 area and 0.207cm^3 volume. The wound is limited to skin breakdown. There is a small amount of serosanguineous drainage  noted. The wound margin is thickened. There is medium (34-66%) pink, pale granulation within the wound bed. There is a small (1-33%) amount of necrotic tissue within the wound bed including Adherent Slough. The periwound skin appearance exhibited: Callus, Moist. The periwound skin appearance did not exhibit: Crepitus, Excoriation, Fluctuance, Friable, Induration, Localized Edema, Rash, Scarring, Dry/Scaly, Maceration, Atrophie Blanche, Cyanosis, Ecchymosis, Hemosiderin Staining, Mottled, Jim [name], [name] ([id number]) Pallor, Rubor, Erythema. Periwound temperature was noted as No Abnormality. Assessment Active Problems ICD-10 E11.621 - Type 2 diabetes mellitus with foot ulcer Z89.512 - Acquired absence of left leg below knee I70.25 - Atherosclerosis of native arteries of other extremities with ulceration L97.512 - Non-pressure chronic ulcer of other part of right foot with fat layer exposed We will continue with silver alginate and offloading felt and his offloading shoe. After his vascular workup may have to consider total contact cast or her DH walking boot. He will come back and see me next week Procedures Wound #2 Wound #2 is a Diabetic Wound/Ulcer of the Lower Extremity located on the Right,Lateral,Plantar Foot . There was a Skin/Subcutaneous Tissue Debridement ([id number]) debridement with total area of 1.32 sq cm performed by Britto, Ignacia Felling., MD. with the following instrument(s): Curette to remove Viable and Non- Viable tissue/material including Exudate and Subcutaneous after achieving pain control using Other (lidocaine 4%). A time out was not conducted prior to the start of the procedure. A Moderate amount of bleeding was controlled with Pressure. The procedure was tolerated well with a pain level of 0 throughout and a pain level of 0 following the procedure. Post Debridement Measurements: 1.2cm length x 1.1cm width x 0.2cm depth; 0.207cm^3 volume. Post procedure Diagnosis Wound  #2: Same as Pre-Procedure Plan Wound Cleansing: Wound #2 Right,Lateral,Plantar Foot: Jim [name], [name] ([id number]) Clean wound with Normal Saline. Anesthetic: Wound #2 Right,Lateral,Plantar Foot: Topical Lidocaine 4% cream applied to wound bed prior to debridement Primary Wound Dressing: Wound #2 Right,Lateral,Plantar Foot: Aquacel Ag - felt surrounding wound for off-loading Secondary Dressing: Wound #2 Right,Lateral,Plantar Foot: Gauze and Kerlix/Conform Foam Dressing Change Frequency: Wound #2 Right,Lateral,Plantar Foot: Change dressing every other day. Follow-up Appointments: Wound #2 Right,Lateral,Plantar Foot: Return Appointment in 1 week. Off-Loading: Wound #2 Right,Lateral,Plantar Foot: Open toe surgical shoe with peg assist. - darco Other: - felt Additional Orders / Instructions: Wound #2 Right,Lateral,Plantar Foot: Increase protein intake. Activity as tolerated We will continue with silver alginate and offloading felt and his offloading shoe. After his vascular workup may have to consider total contact cast or her DH walking boot. He will come back and see me next week Electronic Signature(s) Signed: 09/14/2015 4:48:15 PM By: Jim Kanner MD, FACS Previous Signature: 09/14/2015 4:47:59 PM Version By: Meyer Russel  Glory Buff MD, FACS Previous Signature: 09/14/2015 9:06:52 AM Version By: Jim Kanner MD, FACS Entered By: Jim [name] on 09/14/2015 16:48:15 Jim [name], [name] ([id number]) -------------------------------------------------------------------------------- SuperBill Details Patient Name: Jim [name], [name] Date of Service: 09/14/2015 Medical Record Number: [id number] Patient Account Number: [id number] Date of Birth/Sex: 1930/01/20 (81 y.o. Male) Treating [name]: Jim [name] Primary Care Physician: Jim [name] Other Clinician: Referring Physician: Darreld [name] Treating Physician/Extender: Jim [name] in Treatment: 2 Diagnosis Coding ICD-10 Codes Code  Description E11.621 Type 2 diabetes mellitus with foot ulcer Z89.512 Acquired absence of left leg below knee I70.25 Atherosclerosis of native arteries of other extremities with ulceration L97.512 Non-pressure chronic ulcer of other part of right foot with fat layer exposed Facility Procedures CPT4 Code Description: [id number] 11042 - DEB SUBQ TISSUE 20 SQ CM/< ICD-10 Description Diagnosis E11.621 Type 2 diabetes mellitus with foot ulcer Z89.512 Acquired absence of left leg below knee I70.25 Atherosclerosis of native arteries of other extremiti  L97.512 Non-pressure chronic ulcer of other part of right foo Modifier: es with ulcer t with fat la Quantity: 1 ation yer exposed Physician Procedures CPT4 Code Description: [id number] 11042 - WC PHYS SUBQ TISS 20 SQ CM ICD-10 Description Diagnosis E11.621 Type 2 diabetes mellitus with foot ulcer Z89.512 Acquired absence of left leg below knee I70.25 Atherosclerosis of native arteries of other extremiti  L97.512 Non-pressure chronic ulcer of other part of right foo Modifier: es with ulcera t with fat lay Quantity: 1 tion er exposed Electronic Signature(s) Signed: 09/14/2015 9:07:06 AM By: Jim Kanner MD, FACS Entered By: Jim [name] on 09/14/2015 09:07:05

## 2015-09-21 ENCOUNTER — Encounter: Payer: Medicare HMO | Admitting: Surgery

## 2015-09-21 DIAGNOSIS — L97512 Non-pressure chronic ulcer of other part of right foot with fat layer exposed: Secondary | ICD-10-CM | POA: Diagnosis not present

## 2015-09-21 NOTE — Progress Notes (Addendum)
Jim [name]. ([id number]) Visit Report for 09/21/2015 Arrival Information Details Patient Name: Jim [name], Jim [name]. Date of Service: 09/21/2015 8:00 AM Medical Record Number: [id number] Patient Account Number: [id number] Date of Birth/Sex: 08-Nov-1930 (81 y.o. Male) Treating RN: Clover Mealy, RN, BSN, [place] Sink Primary Care Physician: Darreld Mclean Other Clinician: Referring Physician: Darreld Mclean Treating Physician/Extender: Rudene Re in Treatment: 3 Visit Information History Since Last Visit Any new allergies or adverse reactions: No Patient Arrived: Wheel Chair Had a fall or experienced change in No activities of daily living that may affect Arrival Time: 08:17 risk of falls: Accompanied By: self Signs or symptoms of abuse/neglect since last No Transfer Assistance: None visito Patient Identification Verified: Yes Hospitalized since last visit: No Secondary Verification Process Yes Has Dressing in Place as Prescribed: Yes Completed: Pain Present Now: No Patient Requires Transmission-Based No Precautions: Patient Has Alerts: No Electronic Signature(s) Signed: 09/21/2015 8:17:36 AM By: Elpidio Eric BSN, RN Entered By: Elpidio Eric on 09/21/2015 08:17:36 Jim [name]. ([id number]) -------------------------------------------------------------------------------- Clinic Level of Care Assessment Details Patient Name: Jim [name], Jim [name]. Date of Service: 09/21/2015 8:00 AM Medical Record Number: [id number] Patient Account Number: [id number] Date of Birth/Sex: [birth year] (81 y.o. Male) Treating RN: Huel Coventry Primary Care Physician: Darreld Mclean Other Clinician: Referring Physician: Darreld Mclean Treating Physician/Extender: Rudene Re in Treatment: 3 Clinic Level of Care Assessment Items TOOL 4 Quantity Score []  - Use when only an EandM is performed on FOLLOW-UP visit 0 ASSESSMENTS - Nursing Assessment / Reassessment X - Reassessment of Co-morbidities (includes updates in  patient status) 1 10 []  - Reassessment of Adherence to Treatment Plan 0 ASSESSMENTS - Wound and Skin Assessment / Reassessment X - Simple Wound Assessment / Reassessment - one wound 1 5 []  - Complex Wound Assessment / Reassessment - multiple wounds 0 []  - Dermatologic / Skin Assessment (not related to wound area) 0 ASSESSMENTS - Focused Assessment []  - Circumferential Edema Measurements - multi extremities 0 []  - Nutritional Assessment / Counseling / Intervention 0 []  - Lower Extremity Assessment (monofilament, tuning fork, pulses) 0 []  - Peripheral Arterial Disease Assessment (using hand held doppler) 0 ASSESSMENTS - Ostomy and/or Continence Assessment and Care []  - Incontinence Assessment and Management 0 []  - Ostomy Care Assessment and Management (repouching, etc.) 0 PROCESS - Coordination of Care X - Simple Patient / Family Education for ongoing care 1 15 []  - Complex (extensive) Patient / Family Education for ongoing care 0 X - Staff obtains Chiropractor, Records, Test Results / Process Orders 1 10 []  - Staff telephones HHA, Nursing Homes / Clarify orders / etc 0 []  - Routine Transfer to another Facility (non-emergent condition) 0 Jim [name], Jim [name]. ([id number]) []  - Routine Hospital Admission (non-emergent condition) 0 []  - New Admissions / Manufacturing engineer / Ordering NPWT, Apligraf, etc. 0 []  - Emergency Hospital Admission (emergent condition) 0 X - Simple Discharge Coordination 1 10 []  - Complex (extensive) Discharge Coordination 0 PROCESS - Special Needs []  - Pediatric / Minor Patient Management 0 []  - Isolation Patient Management 0 []  - Hearing / Language / Visual special needs 0 []  - Assessment of Community assistance (transportation, D/C planning, etc.) 0 []  - Additional assistance / Altered mentation 0 []  - Support Surface(s) Assessment (bed, cushion, seat, etc.) 0 INTERVENTIONS - Wound Cleansing / Measurement X - Simple Wound Cleansing - one wound 1 5 []  - Complex  Wound Cleansing - multiple wounds 0 X - Wound Imaging (photographs - any number of wounds) 1 5 []  - Wound Tracing (  instead of photographs) 0 X - Simple Wound Measurement - one wound 1 5  - Complex Wound Measurement - multiple wounds 0 INTERVENTIONS - Wound Dressings  - Small Wound Dressing one or multiple wounds 0 X - Medium Wound Dressing one or multiple wounds 1 15  - Large Wound Dressing one or multiple wounds 0  - Application of Medications - topical 0  - Application of Medications - injection 0 INTERVENTIONS - Miscellaneous  - External ear exam 0 Jim [name]. ([id number])  - Specimen Collection (cultures, biopsies, blood, body fluids, etc.) 0  - Specimen(s) / Culture(s) sent or taken to Lab for analysis 0  - Patient Transfer (multiple staff / Michiel Sites Lift / Similar devices) 0  - Simple Staple / Suture removal (25 or less) 0  - Complex Staple / Suture removal (26 or more) 0  - Hypo / Hyperglycemic Management (close monitor of Blood Glucose) 0  - Ankle / Brachial Index (ABI) - do not check if billed separately 0 X - Vital Signs 1 5 Has the patient been seen at the hospital within the last three years: Yes Total Score: 85 Level Of Care: New/Established - Level 3 Electronic Signature(s) Signed: 09/21/2015 5:14:03 PM By: Elliot Gurney, RN, BSN, Kim RN, BSN Entered By: Elliot Gurney, RN, BSN, Kim on 09/21/2015 08:49:30 Jim [name]. ([id number]) -------------------------------------------------------------------------------- Encounter Discharge Information Details Patient Name: Jim [name]. Date of Service: 09/21/2015 8:00 AM Medical Record Number: [id number] Patient Account Number: [id number] Date of Birth/Sex: 12-Jun-1930 (81 y.o. Male) Treating RN: Clover Mealy, RN, BSN, [place] Sink Primary Care Physician: Darreld Mclean Other Clinician: Referring Physician: Darreld Mclean Treating Physician/Extender: Rudene Re in Treatment: 3 Encounter Discharge Information  Items Discharge Pain Level: 0 Discharge Condition: Stable Ambulatory Status: Wheelchair Discharge Destination: Home Transportation: Private Auto Accompanied By: self Schedule Follow-up Appointment: No Medication Reconciliation completed and provided to Patient/Care No Provider: Provided on Clinical Summary of Care: 09/21/2015 Form Type Recipient Paper Patient [facility] Electronic Signature(s) Signed: 09/21/2015 4:21:14 PM By: Elpidio Eric BSN, RN Previous Signature: 09/21/2015 8:55:35 AM Version By: Gwenlyn Perking Entered By: Elpidio Eric on 09/21/2015 08:59:03 Jim [name], Jim [name]. ([id number]) -------------------------------------------------------------------------------- Lower Extremity Assessment Details Patient Name: Aamodt, Leroi [name]. Date of Service: 09/21/2015 8:00 AM Medical Record Number: [id number] Patient Account Number: [id number] Date of Birth/Sex: 02-19-1930 (81 y.o. Male) Treating RN: Clover Mealy, RN, BSN,  Sink Primary Care Physician: Darreld Mclean Other Clinician: Referring Physician: Darreld Mclean Treating Physician/Extender: Rudene Re in Treatment: 3 Vascular Assessment Pulses: Posterior Tibial Dorsalis Pedis Palpable: [Right:No] Doppler: [Right:Monophasic] Extremity colors, hair growth, and conditions: Extremity Color: [Right:Mottled] Hair Growth on Extremity: [Right:No] Temperature of Extremity: [Right:Warm] Capillary Refill: [Right:< 3 seconds] Electronic Signature(s) Signed: 09/21/2015 8:20:00 AM By: Elpidio Eric BSN, RN Entered By: Elpidio Eric on 09/21/2015 08:20:00 Jim [name], Jim [name]. ([id number]) -------------------------------------------------------------------------------- Multi Wound Chart Details Patient Name: [name]. Date of Service: 09/21/2015 8:00 AM Medical Record Number: [id number] Patient Account Number: [id number] Date of Birth/Sex: May 30, 1930 (81 y.o. Male) Treating RN: Huel Coventry Primary Care Physician: Darreld Mclean Other Clinician: Referring  Physician: Darreld Mclean Treating Physician/Extender: Rudene Re in Treatment: 3 Vital Signs Height(in): 73 Pulse(bpm): 68 Weight(lbs): 222 Blood Pressure 161/56 (mmHg): Body Mass Index(BMI): 29 Temperature(F): 98.2 Respiratory Rate 18 (breaths/min): Photos: [2:No Photos] [N/A:N/A] Wound Location: [2:Right Foot - Plantar, Lateral] [N/A:N/A] Wounding Event: [2:Footwear Injury] [N/A:N/A] Primary Etiology: [2:Diabetic Wound/Ulcer of the Lower Extremity] [N/A:N/A] Comorbid History: [2:Cataracts, Hypertension, Type II Diabetes, Osteoarthritis] [N/A:N/A] Date Acquired: [2:08/08/2015] [N/A:N/A] Weeks of Treatment: [2:3] [N/A:N/A] Wound  Status: [2:Open] [N/A:N/A] Measurements [name] x W x D 1.4x1.4x0.2 [N/A:N/A] (cm) Area (cm) : [2:1.539] [N/A:N/A] Volume (cm) : [2:0.308] [N/A:N/A] % Reduction in Area: [2:-63.40%] [N/A:N/A] % Reduction in Volume: -63.80% [N/A:N/A] Classification: [2:Grade 1] [N/A:N/A] Exudate Amount: [2:Small] [N/A:N/A] Exudate Type: [2:Serosanguineous] [N/A:N/A] Exudate Color: [2:red, brown] [N/A:N/A] Wound Margin: [2:Thickened] [N/A:N/A] Granulation Amount: [2:Medium (34-66%)] [N/A:N/A] Granulation Quality: [2:Pink, Pale] [N/A:N/A] Necrotic Amount: [2:Small (1-33%)] [N/A:N/A] Exposed Structures: [2:Fascia: No Fat: No Tendon: No Muscle: No] [N/A:N/A] Joint: No Bone: No Limited to Skin Breakdown Epithelialization: None N/A N/A Periwound Skin Texture: Callus: Yes N/A N/A Edema: No Excoriation: No Induration: No Crepitus: No Fluctuance: No Friable: No Rash: No Scarring: No Periwound Skin Moist: Yes N/A N/A Moisture: Maceration: No Dry/Scaly: No Periwound Skin Color: Atrophie Blanche: No N/A N/A Cyanosis: No Ecchymosis: No Erythema: No Hemosiderin Staining: No Mottled: No Pallor: No Rubor: No Temperature: No Abnormality N/A N/A Tenderness on No N/A N/A Palpation: Wound Preparation: Ulcer Cleansing: N/A N/A Rinsed/Irrigated  with Saline Topical Anesthetic Applied: Other: lidocaine 4% Treatment Notes Electronic Signature(s) Signed: 09/21/2015 5:14:03 PM By: Elliot Gurney, RN, BSN, Kim RN, BSN Entered By: Elliot Gurney, RN, BSN, Kim on 09/21/2015 08:43:00 Jim [name], Jim [name]. ([id number]) -------------------------------------------------------------------------------- Multi-Disciplinary Care Plan Details Patient Name: Jim Nevin, Zen [name]. Date of Service: 09/21/2015 8:00 AM Medical Record Number: [id number] Patient Account Number: [id number] Date of Birth/Sex: [birth year] (81 y.o. Male) Treating RN: Huel Coventry Primary Care Physician: Darreld Mclean Other Clinician: Referring Physician: Darreld Mclean Treating Physician/Extender: Rudene Re in Treatment: 3 Active Inactive Abuse / Safety / Falls / Self Care Management Nursing Diagnoses: Impaired home maintenance Impaired physical mobility Knowledge deficit related to: safety; personal, health (wound), emergency Potential for falls Self care deficit: actual or potential Goals: Patient will remain injury free Date Initiated: 08/30/2015 Goal Status: Active Patient/caregiver will verbalize understanding of skin care regimen Date Initiated: 08/30/2015 Goal Status: Active Patient/caregiver will verbalize understanding of the importance to maintain current immunizations/vaccinations Date Initiated: 08/30/2015 Goal Status: Active Patient/caregiver will verbalize/demonstrate measures taken to improve the patient's personal safety Date Initiated: 08/30/2015 Goal Status: Active Patient/caregiver will verbalize/demonstrate measures taken to prevent injury and/or falls Date Initiated: 08/30/2015 Goal Status: Active Patient/caregiver will verbalize/demonstrate understanding of what to do in case of emergency Date Initiated: 08/30/2015 Goal Status: Active Interventions: Provide education on basic hygiene Provide education on fall prevention Provide education on personal and home  safety Provide education on safe transfers Jim [name], Jim [name]. ([id number]) Treatment Activities: Education provided on Basic Hygiene : 08/30/2015 Notes: Orientation to the Wound Care Program Nursing Diagnoses: Knowledge deficit related to the wound healing center program Goals: Patient/caregiver will verbalize understanding of the Wound Healing Center Program Date Initiated: 08/30/2015 Goal Status: Active Interventions: Provide education on orientation to the wound center Notes: Wound/Skin Impairment Nursing Diagnoses: Impaired tissue integrity Knowledge deficit related to ulceration/compromised skin integrity Goals: Patient/caregiver will verbalize understanding of skin care regimen Date Initiated: 08/30/2015 Goal Status: Active Ulcer/skin breakdown will have a volume reduction of 50% by week 8 Date Initiated: 08/30/2015 Goal Status: Active Ulcer/skin breakdown will heal within 14 weeks Date Initiated: 08/30/2015 Goal Status: Active Interventions: Assess patient/caregiver ability to obtain necessary supplies Assess patient/caregiver ability to perform ulcer/skin care regimen upon admission and as needed Assess ulceration(s) every visit Provide education on ulcer and skin care Treatment Activities: Skin care regimen initiated : 09/21/2015 Topical wound management initiated : 09/21/2015 Jim [name], Jim [name]. ([id number]) Notes: Electronic Signature(s) Signed: 09/21/2015 5:14:03 PM By: Elliot Gurney, RN, BSN, Kim RN, BSN Entered By: Elliot Gurney, RN,  BSN, Kim on 09/21/2015 08:42:52 Jim [name], Jim [name]. ([id number]) -------------------------------------------------------------------------------- Pain Assessment Details Patient Name: Jim [name], Jim [name]. Date of Service: 09/21/2015 8:00 AM Medical Record Number: [id number] Patient Account Number: [id number] Date of Birth/Sex: 1930-11-23 (81 y.o. Male) Treating RN: Clover Mealy, RN, BSN, [place] Sink Primary Care Physician: Darreld Mclean Other Clinician: Referring Physician: Darreld Mclean Treating Physician/Extender: Rudene Re in Treatment: 3 Active Problems Location of Pain Severity and Description of Pain Patient Has Paino No Site Locations Pain Management and Medication Current Pain Management: Electronic Signature(s) Signed: 09/21/2015 8:18:36 AM By: Elpidio Eric BSN, RN Entered By: Elpidio Eric on 09/21/2015 08:18:36 Jim [name], Jim [name]. ([id number]) -------------------------------------------------------------------------------- Patient/Caregiver Education Details Patient Name: [name]. Date of Service: 09/21/2015 8:00 AM Medical Record Number: [id number] Patient Account Number: [id number] Date of Birth/Gender: November 06, 1930 (81 y.o. Male) Treating RN: Clover Mealy, RN, BSN, [place] Sink Primary Care Physician: Darreld Mclean Other Clinician: Referring Physician: Darreld Mclean Treating Physician/Extender: Rudene Re in Treatment: 3 Education Assessment Education Provided To: Patient Education Topics Provided Basic Hygiene: Methods: Explain/Verbal Responses: State content correctly Safety: Methods: Explain/Verbal Responses: State content correctly Welcome To The [facility]: Methods: Explain/Verbal Responses: State content correctly Wound/Skin Impairment: Methods: Explain/Verbal Responses: State content correctly Electronic Signature(s) Signed: 09/21/2015 4:21:14 PM By: Elpidio Eric BSN, RN Entered By: Elpidio Eric on 09/21/2015 08:59:26 Jim [name], Jim [name]. ([id number]) -------------------------------------------------------------------------------- Wound Assessment Details Patient Name: Jim Nevin, Karma [name]. Date of Service: 09/21/2015 8:00 AM Medical Record Number: [id number] Patient Account Number: [id number] Date of Birth/Sex: [birth year] (81 y.o. Male) Treating RN: Clover Mealy, RN, BSN, [place] Sink Primary Care Physician: Darreld Mclean Other Clinician: Referring Physician: Darreld Mclean Treating Physician/Extender: Rudene Re in Treatment: 3 Wound  Status Wound Number: 2 Primary Diabetic Wound/Ulcer of the Lower Etiology: Extremity Wound Location: Right Foot - Plantar, Lateral Wound Open Wounding Event: Footwear Injury Status: Date Acquired: 08/08/2015 Comorbid Cataracts, Hypertension, Type II Weeks Of Treatment: 3 History: Diabetes, Osteoarthritis Clustered Wound: No Photos Photo Uploaded By: Elpidio Eric on 09/21/2015 16:16:55 Wound Measurements Length: (cm) 1.4 Width: (cm) 1.4 Depth: (cm) 0.2 Area: (cm) 1.539 Volume: (cm) 0.308 % Reduction in Area: -63.4% % Reduction in Volume: -63.8% Epithelialization: None Tunneling: No Undermining: No Wound Description Classification: Grade 1 Foul Odor Af Wound Margin: Thickened Exudate Amount: Small Exudate Type: Serosanguineous Exudate Color: red, brown ter Cleansing: No Wound Bed Granulation Amount: Medium (34-66%) Exposed Structure Granulation Quality: Pink, Pale Fascia Exposed: No Necrotic Amount: Small (1-33%) Fat Layer Exposed: No Necrotic Quality: Adherent Slough Tendon Exposed: No Jim [name], Jim [name]. ([id number]) Muscle Exposed: No Joint Exposed: No Bone Exposed: No Limited to Skin Breakdown Periwound Skin Texture Texture Color No Abnormalities Noted: No No Abnormalities Noted: No Callus: Yes Atrophie Blanche: No Crepitus: No Cyanosis: No Excoriation: No Ecchymosis: No Fluctuance: No Erythema: No Friable: No Hemosiderin Staining: No Induration: No Mottled: No Localized Edema: No Pallor: No Rash: No Rubor: No Scarring: No Temperature / Pain Moisture Temperature: No Abnormality No Abnormalities Noted: No Dry / Scaly: No Maceration: No Moist: Yes Wound Preparation Ulcer Cleansing: Rinsed/Irrigated with Saline Topical Anesthetic Applied: Other: lidocaine 4%, Treatment Notes Wound #2 (Right, Lateral, Plantar Foot) 1. Cleansed with: Clean wound with Normal Saline 4. Dressing Applied: Aquacel Ag 5. Secondary Dressing  Applied Foam Kerlix/Conform 7. Secured with Tape Notes felt surrounding wound for off-loading Electronic Signature(s) Signed: 09/21/2015 8:24:33 AM By: Elpidio Eric BSN, RN Entered By: Elpidio Eric on 09/21/2015 08:24:33 Mayol, Stepfon [name]. ([id number]) -------------------------------------------------------------------------------- Vitals Details Patient Name: Walth, Hawkins [name]. Date of Service: 09/21/2015 8:00  AM Medical Record Number: [id number] Patient Account Number: [id number] Date of Birth/Sex: 12/29/1930 (81 y.o. Male) Treating RN: Afful, RN, BSN, [place] Sink Primary Care Physician: Darreld Mclean Other Clinician: Referring Physician: Darreld Mclean Treating Physician/Extender: Rudene Re in Treatment: 3 Vital Signs Time Taken: 08:18 Temperature (F): 98.2 Height (in): 73 Pulse (bpm): 68 Weight (lbs): 222 Respiratory Rate (breaths/min): 18 Body Mass Index (BMI): 29.3 Blood Pressure (mmHg): 161/56 Reference Range: 80 - 120 mg / dl Electronic Signature(s) Signed: 09/21/2015 8:19:35 AM By: Elpidio Eric BSN, RN Entered By: Elpidio Eric on 09/21/2015 08:19:35

## 2015-09-22 NOTE — Progress Notes (Signed)
Rhudy, Arrian L. ([id number]) Visit Report for 09/21/2015 Chief Complaint Document Details Patient Name: Jim [name], [name] Date of Service: 09/21/2015 8:00 AM Medical Record Number: [id number] Patient Account Number: [id number] Date of Birth/Sex: 05/01/1930 (81 y.o. Male) Treating RN: Clover Mealy, RN, BSN, [place] Sink Primary Care Physician: Darreld Mclean Other Clinician: Referring Physician: Darreld Mclean Treating Physician/Extender: Rudene Re in Treatment: 3 Information Obtained from: Patient Chief Complaint Patients presents for treatment of an open diabetic ulcer. The patient noticed some blood on his sock and noticed that he had a ulcerated area on his right forefoot and his right fourth toe. This was about 3 weeks ago. Electronic Signature(s) Signed: 09/21/2015 9:02:50 AM By: Evlyn Kanner MD, FACS Entered By: Evlyn Kanner on 09/21/2015 09:02:50 Lecompte, Bradey L. ([id number]) -------------------------------------------------------------------------------- Debridement Details Patient Name: Jim [name], Churchill L. Date of Service: 09/21/2015 8:00 AM Medical Record Number: [id number] Patient Account Number: [id number] Date of Birth/Sex: 1930/07/27 (81 y.o. Male) Treating RN: Afful, RN, BSN, Rita Primary Care Physician: Darreld Mclean Other Clinician: Referring Physician: Darreld Mclean Treating Physician/Extender: Rudene Re in Treatment: 3 Debridement Performed for Wound #2 Right,Lateral,Plantar Foot Assessment: Performed By: Physician Tristan Schroeder., MD Debridement: Debridement Pre-procedure Yes Verification/Time Out Taken: Start Time: 08:40 Pain Control: Other : lidocaine 4% Level: Skin/Subcutaneous Tissue Total Area Debrided (L x 1.4 (cm) x 1.4 (cm) = 1.96 (cm) W): Tissue and other Viable, Non-Viable, Exudate, Fibrin/Slough, Subcutaneous material debrided: Instrument: Forceps, Scissors Bleeding: Minimum Hemostasis Achieved: Silver Nitrate End Time: 08:47 Procedural Pain:  0 Post Procedural Pain: 0 Response to Treatment: Procedure was tolerated well Post Debridement Measurements of Total Wound Length: (cm) 1.6 Width: (cm) 1.6 Depth: (cm) 0.2 Volume: (cm) 0.402 Post Procedure Diagnosis Same as Pre-procedure Electronic Signature(s) Signed: 09/21/2015 9:02:44 AM By: Evlyn Kanner MD, FACS Signed: 09/21/2015 4:21:14 PM By: Elpidio Eric BSN, RN Entered By: Evlyn Kanner on 09/21/2015 09:02:44 Tillery, Victoria L. ([id number]) -------------------------------------------------------------------------------- HPI Details Patient Name: Jim [name], [name] Date of Service: 09/21/2015 8:00 AM Medical Record Number: [id number] Patient Account Number: [id number] Date of Birth/Sex: [birth year] (81 y.o. Male) Treating RN: Clover Mealy, RN, BSN, Rita Primary Care Physician: Darreld Mclean Other Clinician: Referring Physician: Darreld Mclean Treating Physician/Extender: Rudene Re in Treatment: 3 History of Present Illness Location: ulcerated area on the right foot plantar aspect and his right fourth toe Quality: Patient reports No Pain. Severity: Patient states wound are getting worse. Duration: Patient has had the wound for < 3 weeks prior to presenting for treatment Context: The wound appeared gradually over time Modifying Factors: Other treatment(s) tried include:the patient has been put on oral Levaquin for 10 days Associated Signs and Symptoms: Patient reports having increase swelling. HPI Description: The patient is a pleasant 81 year old with a past medical history significant for type 2 diabetes and peripheral vascular disease. He underwent a left below-the-knee amputation approximately 10 years ago after stepping on a nail. He has done well since that time and is able to ambulate with a prosthetic. He has had a vascular procedure on his right leg about 4-5 years ago and has never seen the doctor again. His diabetes is under control and his last hemoglobin A1c was 7.7%  measured in January 2016. His other comorbidities are hypertension, cellulitis of the foot, recent urinary tract infection. He was seen earlier for a wound problem about a year ago on his BKA stump. He has been referred back to the wound center by his PCP Dr. Darreld Mclean for a moderately severe cellulitis of the right foot and  right fourth toenail in a gentleman who is known to have PVD and is status post left BKA and has insulin-dependent diabetes mellitus. 09/07/2015 -- the patient has not yet got his appointment to see the vascular doctors and I have urged him to keep this. He's also been having some problems with home health coming to do his dressings and we will work with his agency to synchronize this. 09/14/2015 -- he has an appointment to see the vascular surgeons on Monday. 09/21/2015-- due to transport issues he was unable to keep his vascular appointment and is rescheduled for next week. Electronic Signature(s) Signed: 09/21/2015 9:03:20 AM By: Evlyn Kanner MD, FACS Entered By: Evlyn Kanner on 09/21/2015 09:03:20 Gratz, Trenten L. ([id number]) -------------------------------------------------------------------------------- Physical Exam Details Patient Name: Jim [name], [name] Date of Service: 09/21/2015 8:00 AM Medical Record Number: [id number] Patient Account Number: [id number] Date of Birth/Sex: 1930-05-20 (81 y.o. Male) Treating RN: Clover Mealy, RN, BSN, [place] Sink Primary Care Physician: Darreld Mclean Other Clinician: Referring Physician: Darreld Mclean Treating Physician/Extender: Rudene Re in Treatment: 3 Constitutional . Pulse regular. Respirations normal and unlabored. Afebrile. . Eyes Nonicteric. Reactive to light. Ears, Nose, Mouth, and Throat Lips, teeth, and gums WNL.Marland Kitchen Moist mucosa without lesions . Neck supple and nontender. No palpable supraclavicular or cervical adenopathy. Normal sized without goiter. Respiratory WNL. No retractions.. Cardiovascular Pedal Pulses  WNL. No clubbing, cyanosis or edema. Lymphatic No adneopathy. No adenopathy. No adenopathy. Musculoskeletal Adexa without tenderness or enlargement.. Digits and nails w/o clubbing, cyanosis, infection, petechiae, ischemia, or inflammatory conditions.. Integumentary (Hair, Skin) No suspicious lesions. No crepitus or fluctuance. No peri-wound warmth or erythema. No masses.Marland Kitchen Psychiatric Judgement and insight Intact.. No evidence of depression, anxiety, or agitation.. Notes There is again callus buildup around the edges and overhanging the ulcer and this shall be sharply debrided including some of the subcutaneous tissue. Electronic Signature(s) Signed: 09/21/2015 9:03:58 AM By: Evlyn Kanner MD, FACS Entered By: Evlyn Kanner on 09/21/2015 09:03:58 Mordecai, Rony L. ([id number]) -------------------------------------------------------------------------------- Physician Orders Details Patient Name: [name]. Date of Service: 09/21/2015 8:00 AM Medical Record Number: [id number] Patient Account Number: [id number] Date of Birth/Sex: 02-24-1930 (81 y.o. Male) Treating RN: Huel Coventry Primary Care Physician: Darreld Mclean Other Clinician: Referring Physician: Darreld Mclean Treating Physician/Extender: Rudene Re in Treatment: 3 Verbal / Phone Orders: Yes Clinician: Huel Coventry Read Back and Verified: Yes Diagnosis Coding Wound Cleansing Wound #2 Right,Lateral,Plantar Foot o Clean wound with Normal Saline. Anesthetic Wound #2 Right,Lateral,Plantar Foot o Topical Lidocaine 4% cream applied to wound bed prior to debridement Primary Wound Dressing Wound #2 Right,Lateral,Plantar Foot o Aquacel Ag - felt surrounding wound for off-loading Secondary Dressing o Gauze and Kerlix/Conform Dressing Change Frequency Wound #2 Right,Lateral,Plantar Foot o Change dressing every other day. Follow-up Appointments Wound #2 Right,Lateral,Plantar Foot o Return Appointment in 1  week. Off-Loading Wound #2 Right,Lateral,Plantar Foot o Open toe surgical shoe with peg assist. - darco o Other: - felt Additional Orders / Instructions Wound #2 Right,Lateral,Plantar Foot o Increase protein intake. o Activity as tolerated Notes Odem, Sylvain L. ([id number]) Wear Darco shoe at all times when walking. Electronic Signature(s) Signed: 09/21/2015 4:15:54 PM By: Evlyn Kanner MD, FACS Signed: 09/21/2015 5:14:03 PM By: Elliot Gurney RN, BSN, Kim RN, BSN Entered By: Elliot Gurney, RN, BSN, Kim on 09/21/2015 08:48:39 Newill, Michial L. ([id number]) -------------------------------------------------------------------------------- Problem List Details Patient Name: Jim [name], Jailyn L. Date of Service: 09/21/2015 8:00 AM Medical Record Number: [id number] Patient Account Number: [id number] Date of Birth/Sex: April 21, 1930 (81 y.o. 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 ([id number]) -------------------------------------------------------------------------------- Progress Note Details Patient Name: Jim [name], Ames L. Date of Service: 09/21/2015 8:00 AM Medical Record Number: [id number] Patient Account Number: [id number] Date of Birth/Sex: 01-31-1930 (81 y.o. Male) Treating RN: Clover Mealy,  RN, BSN, Rita Primary Care Physician: Darreld Mclean Other Clinician: Referring Physician: Darreld Mclean Treating Physician/Extender: Rudene Re in Treatment: 3 Subjective Chief Complaint Information obtained from Patient Patients presents for treatment of an open diabetic ulcer. The patient noticed some blood on his sock and noticed that he had a ulcerated area on his right forefoot and his right fourth toe. This was about 3 weeks ago. History of Present Illness (HPI) The following HPI elements were documented for the patient's wound: Location: ulcerated area on the right foot plantar aspect and his right fourth toe Quality: Patient reports No Pain. Severity: Patient states wound are getting worse. Duration: Patient has had the wound for < 3 weeks prior to presenting for treatment Context: The wound appeared gradually over time Modifying Factors: Other treatment(s) tried include:the patient has been put on oral Levaquin for 10 days Associated Signs and Symptoms: Patient reports having increase swelling. The patient is a pleasant 81 year old with a past medical history significant for type 2 diabetes and peripheral vascular disease. He underwent a left below-the-knee amputation approximately 10 years ago after stepping on a nail. He has done well since that time and is able to ambulate with a prosthetic. He has had a vascular procedure on his right leg about 4-5 years ago and has never seen the doctor again. His diabetes is under control and his last hemoglobin A1c was 7.7% measured in January 2016. His other comorbidities are hypertension, cellulitis of the foot, recent urinary tract infection. He was seen earlier for a wound problem about a year ago on his BKA stump. He has been referred back to the wound center by his PCP Dr. Darreld Mclean for a moderately severe cellulitis of the right foot and right fourth toenail in a gentleman who is known to have PVD and is status post  left BKA and has insulin-dependent diabetes mellitus. 09/07/2015 -- the patient has not yet got his appointment to see the vascular doctors and I have urged him to keep this. He's also been having some problems with home health coming to do his dressings and we will work with his agency to synchronize this. 09/14/2015 -- he has an appointment to see the vascular surgeons on Monday. 09/21/2015-- due to transport issues he was unable to keep his vascular appointment and is rescheduled for next week. Kraszewski, Slate L. ([id number]) Objective Constitutional Pulse regular. Respirations normal and unlabored. Afebrile. Vitals Time Taken: 8:18 AM, Height: 73 in, Weight: 222 lbs, BMI: 29.3, Temperature: 98.2 F, Pulse: 68 bpm, Respiratory Rate: 18 breaths/min, Blood Pressure: 161/56 mmHg. Eyes Nonicteric. Reactive to light. Ears, Nose, Mouth, and Throat Lips, teeth, and gums WNL.Marland Kitchen Moist mucosa without lesions . Neck  supple and nontender. No palpable supraclavicular or cervical adenopathy. Normal sized without goiter. Respiratory WNL. No retractions.. Cardiovascular Pedal Pulses WNL. No clubbing, cyanosis or edema. Lymphatic No adneopathy. No adenopathy. No adenopathy. Musculoskeletal Adexa without tenderness or enlargement.. Digits and nails w/o clubbing, cyanosis, infection, petechiae, ischemia, or inflammatory conditions.Marland Kitchen Psychiatric Judgement and insight Intact.. No evidence of depression, anxiety, or agitation.. General Notes: There is again callus buildup around the edges and overhanging the ulcer and this shall be sharply debrided including some of the subcutaneous tissue. Integumentary (Hair, Skin) No suspicious lesions. No crepitus or fluctuance. No peri-wound warmth or erythema. No masses.. Wound #2 status is Open. Original cause of wound was Footwear Injury. The wound is located on the Right,Lateral,Plantar Foot. The wound measures 1.4cm length x 1.4cm width x 0.2cm depth;  1.539cm^2 area and 0.308cm^3 volume. The wound is limited to skin breakdown. There is no tunneling or undermining noted. There is a small amount of serosanguineous drainage noted. The wound margin is thickened. There is medium (34-66%) pink, pale granulation within the wound bed. There is a small (1-33%) amount of necrotic tissue within the wound bed including Adherent Slough. The periwound skin appearance exhibited: Callus, Moist. The periwound skin appearance did not exhibit: Crepitus, Excoriation, Fluctuance, Friable, Induration, Localized Edema, Rash, Scarring, Dry/Scaly, Maceration, Atrophie Blanche, Cyanosis, Churchman, Riddick L. ([id number]) Ecchymosis, Hemosiderin Staining, Mottled, Pallor, Rubor, Erythema. Periwound temperature was noted as No Abnormality. Assessment Active Problems ICD-10 E11.621 - Type 2 diabetes mellitus with foot ulcer Z89.512 - Acquired absence of left leg below knee I70.25 - Atherosclerosis of native arteries of other extremities with ulceration L97.512 - Non-pressure chronic ulcer of other part of right foot with fat layer exposed We will continue with silver alginate and a felt ring to try and offload and I have urged him to use the offloading shoe. The patient has rescheduled his vascular appointment and pending this report we will make adjustments to his offloading, with either a TCC or a DH walking boot. Procedures Wound #2 Wound #2 is a Diabetic Wound/Ulcer of the Lower Extremity located on the Right,Lateral,Plantar Foot . There was a Skin/Subcutaneous Tissue Debridement ([id number]) debridement with total area of 1.96 sq cm performed by Britto, Ignacia Felling., MD. with the following instrument(s): Forceps and Scissors to remove Viable and Non-Viable tissue/material including Exudate, Fibrin/Slough, and Subcutaneous after achieving pain control using Other (lidocaine 4%). A time out was conducted prior to the start of the procedure. A Minimum amount of bleeding  was controlled with Silver Nitrate. The procedure was tolerated well with a pain level of 0 throughout and a pain level of 0 following the procedure. Post Debridement Measurements: 1.6cm length x 1.6cm width x 0.2cm depth; 0.402cm^3 volume. Post procedure Diagnosis Wound #2: Same as Pre-Procedure Plan Wound Cleansing: Wound #2 Right,Lateral,Plantar Foot: Clean wound with Normal Saline. Pifer, Lessie L. ([id number]) Anesthetic: Wound #2 Right,Lateral,Plantar Foot: Topical Lidocaine 4% cream applied to wound bed prior to debridement Primary Wound Dressing: Wound #2 Right,Lateral,Plantar Foot: Aquacel Ag - felt surrounding wound for off-loading Secondary Dressing: Gauze and Kerlix/Conform Dressing Change Frequency: Wound #2 Right,Lateral,Plantar Foot: Change dressing every other day. Follow-up Appointments: Wound #2 Right,Lateral,Plantar Foot: Return Appointment in 1 week. Off-Loading: Wound #2 Right,Lateral,Plantar Foot: Open toe surgical shoe with peg assist. - darco Other: - felt Additional Orders / Instructions: Wound #2 Right,Lateral,Plantar Foot: Increase protein intake. Activity as tolerated General Notes: Wear Darco shoe at all times when walking. We will continue with silver alginate and a felt ring  to try and offload and I have urged him to use the offloading shoe. The patient has rescheduled his vascular appointment and pending this report we will make adjustments to his offloading, with either a TCC or a DH walking boot. Electronic Signature(s) Signed: 09/21/2015 9:05:58 AM By: Evlyn Kanner MD, FACS Entered By: Evlyn Kanner on 09/21/2015 09:05:57 Selinger, Meredith L. ([id number]) -------------------------------------------------------------------------------- SuperBill Details Patient Name: Jim [name], Toby L. Date of Service: 09/21/2015 Medical Record Number: [id number] Patient Account Number: [id number] Date of Birth/Sex: December 21, 1930 (81 y.o. Male) Treating RN: Clover Mealy, RN,  BSN, Rita Primary Care Physician: Darreld Mclean Other Clinician: Referring Physician: Darreld Mclean Treating Physician/Extender: Rudene Re in Treatment: 3 Diagnosis Coding ICD-10 Codes Code Description E11.621 Type 2 diabetes mellitus with foot ulcer Z89.512 Acquired absence of left leg below knee I70.25 Atherosclerosis of native arteries of other extremities with ulceration L97.512 Non-pressure chronic ulcer of other part of right foot with fat layer exposed Facility Procedures CPT4 Code Description: [id number] 11042 - DEB SUBQ TISSUE 20 SQ CM/< ICD-10 Description Diagnosis E11.621 Type 2 diabetes mellitus with foot ulcer Z89.512 Acquired absence of left leg below knee I70.25 Atherosclerosis of native arteries of other extremiti  L97.512 Non-pressure chronic ulcer of other part of right foo Modifier: es with ulcer t with fat la Quantity: 1 ation yer exposed Physician Procedures CPT4 Code Description: [id number] 11042 - WC PHYS SUBQ TISS 20 SQ CM ICD-10 Description Diagnosis E11.621 Type 2 diabetes mellitus with foot ulcer Z89.512 Acquired absence of left leg below knee I70.25 Atherosclerosis of native arteries of other extremiti  L97.512 Non-pressure chronic ulcer of other part of right foo Modifier: es with ulcera t with fat lay Quantity: 1 tion er exposed Electronic Signature(s) Signed: 09/21/2015 4:15:54 PM By: Evlyn Kanner MD, FACS Signed: 09/21/2015 5:14:03 PM By: Elliot Gurney RN, BSN, Kim RN, BSN Previous Signature: 09/21/2015 9:06:11 AM Version By: Evlyn Kanner MD, FACS Entered By: Elliot Gurney RN, BSN, Kim on 09/21/2015 15:41:38

## 2015-09-28 ENCOUNTER — Encounter: Payer: Medicare HMO | Admitting: Surgery

## 2015-09-28 DIAGNOSIS — L97512 Non-pressure chronic ulcer of other part of right foot with fat layer exposed: Secondary | ICD-10-CM | POA: Diagnosis not present

## 2015-09-28 NOTE — Progress Notes (Signed)
[name], [name] ([id number]) Visit Report for 09/28/2015 Arrival Information Details Patient Name: Jim [name], [name] Date of Service: 09/28/2015 8:00 AM Medical Record Number: [id number] Patient Account Number: [id number] Date of Birth/Sex: [birth year] (81 y.o. Male) Treating RN: Clover Mealy, RN, BSN, [place] Sink Primary Care Physician: Darreld Mclean Other Clinician: Referring Physician: Darreld Mclean Treating Physician/Extender: Rudene Re in Treatment: 4 Visit Information History Since Last Visit Any new allergies or adverse reactions: No Patient Arrived: Wheel Chair Had a fall or experienced change in No activities of daily living that may affect Arrival Time: 08:21 risk of falls: Accompanied By: self Signs or symptoms of abuse/neglect since last No Transfer Assistance: None visito Patient Identification Verified: Yes Has Dressing in Place as Prescribed: Yes Secondary Verification Process Yes Pain Present Now: No Completed: Patient Requires Transmission-Based No Precautions: Patient Has Alerts: No Electronic Signature(s) Signed: 09/28/2015 8:22:20 AM By: Elpidio Eric BSN, RN Entered By: Elpidio Eric on 09/28/2015 08:22:20 Holben, Shonte L. ([id number]) -------------------------------------------------------------------------------- Encounter Discharge Information Details Patient Name: Jim [name]. Date of Service: 09/28/2015 8:00 AM Medical Record Number: [id number] Patient Account Number: [id number] Date of Birth/Sex: [birth year] (81 y.o. Male) Treating RN: Clover Mealy, RN, BSN, [place] Primary Care Physician: Darreld Mclean Other Clinician: Referring Physician: Darreld Mclean Treating Physician/Extender: Rudene Re in Treatment: 4 Encounter Discharge Information Items Discharge Pain Level: 0 Discharge Condition: Stable Ambulatory Status: Wheelchair Discharge Destination: Home Transportation: Private Auto Accompanied By: self Schedule Follow-up Appointment: No Medication  Reconciliation completed and provided to Patient/Care No Provider: Provided on Clinical Summary of Care: 09/28/2015 Form Type Recipient Paper Patient [facility] Electronic Signature(s) Signed: 09/28/2015 9:25:39 AM By: Elpidio Eric BSN, RN Previous Signature: 09/28/2015 8:48:18 AM Version By: Gwenlyn Perking Entered By: Elpidio Eric on 09/28/2015 09:25:39 Roseboom, Aeson L. ([id number]) -------------------------------------------------------------------------------- Lower Extremity Assessment Details Patient Name: Jim [name], Kazi L. Date of Service: 09/28/2015 8:00 AM Medical Record Number: [id number] Patient Account Number: [id number] Date of Birth/Sex: 1930/03/20 (81 y.o. Male) Treating RN: Clover Mealy, RN, BSN, [place] Sink Primary Care Physician: Darreld Mclean Other Clinician: Referring Physician: Darreld Mclean Treating Physician/Extender: Rudene Re in Treatment: 4 Vascular Assessment Pulses: Posterior Tibial Dorsalis Pedis Palpable: [Right:No] Doppler: [Right:Monophasic] Extremity colors, hair growth, and conditions: Extremity Color: [Right:Mottled] Hair Growth on Extremity: [Right:No] Temperature of Extremity: [Right:Warm] Capillary Refill: [Right:< 3 seconds] Toe Nail Assessment Left: Right: Thick: Yes Discolored: Yes Deformed: Yes Improper Length and Hygiene: Yes Electronic Signature(s) Signed: 09/28/2015 8:24:38 AM By: Elpidio Eric BSN, RN Previous Signature: 09/28/2015 8:21:56 AM Version By: Elpidio Eric BSN, RN Entered By: Elpidio Eric on 09/28/2015 08:24:38 Baltazar, Rohit L. ([id number]) -------------------------------------------------------------------------------- Multi Wound Chart Details Patient Name: Jim [name], [name] Date of Service: 09/28/2015 8:00 AM Medical Record Number: [id number] Patient Account Number: [id number] Date of Birth/Sex: [birth year] (81 y.o. Male) Treating RN: Clover Mealy, RN, BSN, [place] Sink Primary Care Physician: Darreld Mclean Other Clinician: Referring Physician: Darreld Mclean Treating Physician/Extender: Rudene Re in Treatment: 4 Vital Signs Height(in): 73 Pulse(bpm): 69 Weight(lbs): 222 Blood Pressure 118/60 (mmHg): Body Mass Index(BMI): 29 Temperature(F): 97.9 Respiratory Rate 18 (breaths/min): Photos: [2:No Photos] [N/A:N/A] Wound Location: [2:Right Foot - Plantar, Lateral] [N/A:N/A] Wounding Event: [2:Footwear Injury] [N/A:N/A] Primary Etiology: [2:Diabetic Wound/Ulcer of the Lower Extremity] [N/A:N/A] Comorbid History: [2:Cataracts, Hypertension, Type II Diabetes, Osteoarthritis] [N/A:N/A] Date Acquired: [2:08/08/2015] [N/A:N/A] Weeks of Treatment: [2:4] [N/A:N/A] Wound Status: [2:Open] [N/A:N/A] Measurements L x W x D 1.5x1.2x0.2 [N/A:N/A] (cm) Area (cm) : [2:1.414] [N/A:N/A] Volume (cm) : [2:0.283] [N/A:N/A] % Reduction in Area: [2:-50.10%] [N/A:N/A] % Reduction in Volume: -50.50% [N/A:N/A]  Classification: [2:Grade 1] [N/A:N/A] Exudate Amount: [2:Small] [N/A:N/A] Exudate Type: [2:Serosanguineous] [N/A:N/A] Exudate Color: [2:red, brown] [N/A:N/A] Wound Margin: [2:Thickened] [N/A:N/A] Granulation Amount: [2:Medium (34-66%)] [N/A:N/A] Granulation Quality: [2:Pink, Pale] [N/A:N/A] Necrotic Amount: [2:Small (1-33%)] [N/A:N/A] Exposed Structures: [2:Fascia: No Fat: No Tendon: No Muscle: No] [N/A:N/A] Joint: No Bone: No Limited to Skin Breakdown Epithelialization: None N/A N/A Periwound Skin Texture: Callus: Yes N/A N/A Edema: No Excoriation: No Induration: No Crepitus: No Fluctuance: No Friable: No Rash: No Scarring: No Periwound Skin Moist: Yes N/A N/A Moisture: Maceration: No Dry/Scaly: No Periwound Skin Color: Atrophie Blanche: No N/A N/A Cyanosis: No Ecchymosis: No Erythema: No Hemosiderin Staining: No Mottled: No Pallor: No Rubor: No Temperature: No Abnormality N/A N/A Tenderness on No N/A N/A Palpation: Wound Preparation: Ulcer Cleansing: N/A N/A Rinsed/Irrigated with Saline Topical  Anesthetic Applied: Other: lidocaine 4% Treatment Notes Electronic Signature(s) Signed: 09/28/2015 8:32:17 AM By: Elpidio Eric BSN, RN Entered By: Elpidio Eric on 09/28/2015 08:32:17 Mccollum, Darby L. ([id number]) -------------------------------------------------------------------------------- Multi-Disciplinary Care Plan Details Patient Name: Jim [name], Filimon L. Date of Service: 09/28/2015 8:00 AM Medical Record Number: [id number] Patient Account Number: [id number] Date of Birth/Sex: 1930-11-19 (81 y.o. Male) Treating RN: Clover Mealy, RN, BSN, [place] Sink Primary Care Physician: Darreld Mclean Other Clinician: Referring Physician: Darreld Mclean Treating Physician/Extender: Rudene Re in Treatment: 4 Active Inactive Abuse / Safety / Falls / Self Care Management Nursing Diagnoses: Impaired home maintenance Impaired physical mobility Knowledge deficit related to: safety; personal, health (wound), emergency Potential for falls Self care deficit: actual or potential Goals: Patient will remain injury free Date Initiated: 08/30/2015 Goal Status: Active Patient/caregiver will verbalize understanding of skin care regimen Date Initiated: 08/30/2015 Goal Status: Active Patient/caregiver will verbalize understanding of the importance to maintain current immunizations/vaccinations Date Initiated: 08/30/2015 Goal Status: Active Patient/caregiver will verbalize/demonstrate measures taken to improve the patient's personal safety Date Initiated: 08/30/2015 Goal Status: Active Patient/caregiver will verbalize/demonstrate measures taken to prevent injury and/or falls Date Initiated: 08/30/2015 Goal Status: Active Patient/caregiver will verbalize/demonstrate understanding of what to do in case of emergency Date Initiated: 08/30/2015 Goal Status: Active Interventions: Provide education on basic hygiene Provide education on fall prevention Provide education on personal and home safety Provide education on safe  transfers B[place]ee, Skip L. ([id number]) Treatment Activities: Education provided on Basic Hygiene : 08/30/2015 Notes: Orientation to the Wound Care Program Nursing Diagnoses: Knowledge deficit related to the wound healing center program Goals: Patient/caregiver will verbalize understanding of the Wound Healing Center Program Date Initiated: 08/30/2015 Goal Status: Active Interventions: Provide education on orientation to the wound center Notes: Wound/Skin Impairment Nursing Diagnoses: Impaired tissue integrity Knowledge deficit related to ulceration/compromised skin integrity Goals: Patient/caregiver will verbalize understanding of skin care regimen Date Initiated: 08/30/2015 Goal Status: Active Ulcer/skin breakdown will have a volume reduction of 50% by week 8 Date Initiated: 08/30/2015 Goal Status: Active Ulcer/skin breakdown will heal within 14 weeks Date Initiated: 08/30/2015 Goal Status: Active Interventions: Assess patient/caregiver ability to obtain necessary supplies Assess patient/caregiver ability to perform ulcer/skin care regimen upon admission and as needed Assess ulceration(s) every visit Provide education on ulcer and skin care Treatment Activities: Skin care regimen initiated : 09/28/2015 Topical wound management initiated : 09/28/2015 Burkholder, Zeshan L. ([id number]) Notes: Electronic Signature(s) Signed: 09/28/2015 8:32:08 AM By: Elpidio Eric BSN, RN Entered By: Elpidio Eric on 09/28/2015 08:32:08 Richart, Bertin L. ([id number]) -------------------------------------------------------------------------------- Pain Assessment Details Patient Name: Jim [name], Ovila L. Date of Service: 09/28/2015 8:00 AM Medical Record Number: [id number] Patient Account Number: [id number] Date of Birth/Sex: June 08, 1930 (81 y.o. Male) Treating  RN: Clover Mealy, RN, BSN, [place] Sink Primary Care Physician: Darreld Mclean Other Clinician: Referring Physician: Darreld Mclean Treating Physician/Extender: Rudene Re in Treatment: 4 Active Problems Location of Pain Severity and Description of Pain Patient Has Paino No Site Locations Pain Management and Medication Current Pain Management: Electronic Signature(s) Signed: 09/28/2015 8:22:27 AM By: Elpidio Eric BSN, RN Entered By: Elpidio Eric on 09/28/2015 08:22:27 Figiel, Jaydon L. ([id number]) -------------------------------------------------------------------------------- Patient/Caregiver Education Details Patient Name: Jim [name]. Date of Service: 09/28/2015 8:00 AM Medical Record Number: [id number] Patient Account Number: [id number] Date of Birth/Gender: 09/05/1930 (80 y.o. Male) Treating RN: Clover Mealy, RN, BSN, [place] Sink Primary Care Physician: Darreld Mclean Other Clinician: Referring Physician: Darreld Mclean Treating Physician/Extender: Rudene Re in Treatment: 4 Education Assessment Education Provided To: Patient Education Topics Provided Basic Hygiene: Methods: Explain/Verbal Responses: State content correctly Safety: Methods: Explain/Verbal Responses: State content correctly Welcome To The [facility]: Methods: Explain/Verbal Wound/Skin Impairment: Methods: Explain/Verbal Responses: State content correctly Electronic Signature(s) Signed: 09/28/2015 9:26:00 AM By: Elpidio Eric BSN, RN Entered By: Elpidio Eric on 09/28/2015 09:26:00 Barrows, Jadarious L. ([id number]) -------------------------------------------------------------------------------- Wound Assessment Details Patient Name: Jim [name], Jacaden L. Date of Service: 09/28/2015 8:00 AM Medical Record Number: [id number] Patient Account Number: [id number] Date of Birth/Sex: Sep 24, 1930 (81 y.o. Male) Treating RN: Clover Mealy, RN, BSN, [place] Sink Primary Care Physician: Darreld Mclean Other Clinician: Referring Physician: Darreld Mclean Treating Physician/Extender: Rudene Re in Treatment: 4 Wound Status Wound Number: 2 Primary Diabetic Wound/Ulcer of the Lower Etiology:  Extremity Wound Location: Right Foot - Plantar, Lateral Wound Open Wounding Event: Footwear Injury Status: Date Acquired: 08/08/2015 Comorbid Cataracts, Hypertension, Type II Weeks Of Treatment: 4 History: Diabetes, Osteoarthritis Clustered Wound: No Photos Photo Uploaded By: Elpidio Eric on 09/28/2015 14:33:39 Wound Measurements Length: (cm) 1.5 Width: (cm) 1.2 Depth: (cm) 0.2 Area: (cm) 1.414 Volume: (cm) 0.283 % Reduction in Area: -50.1% % Reduction in Volume: -50.5% Epithelialization: None Tunneling: No Undermining: No Wound Description Classification: Grade 1 Foul Odor Af Wound Margin: Thickened Exudate Amount: Small Exudate Type: Serosanguineous Exudate Color: red, brown ter Cleansing: No Wound Bed Granulation Amount: Medium (34-66%) Exposed Structure Granulation Quality: Pink, Pale Fascia Exposed: No Necrotic Amount: Small (1-33%) Fat Layer Exposed: No Necrotic Quality: Adherent Slough Tendon Exposed: No Brasel, Saturnino L. ([id number]) Muscle Exposed: No Joint Exposed: No Bone Exposed: No Limited to Skin Breakdown Periwound Skin Texture Texture Color No Abnormalities Noted: No No Abnormalities Noted: No Callus: Yes Atrophie Blanche: No Crepitus: No Cyanosis: No Excoriation: No Ecchymosis: No Fluctuance: No Erythema: No Friable: No Hemosiderin Staining: No Induration: No Mottled: No Localized Edema: No Pallor: No Rash: No Rubor: No Scarring: No Temperature / Pain Moisture Temperature: No Abnormality No Abnormalities Noted: No Dry / Scaly: No Maceration: No Moist: Yes Wound Preparation Ulcer Cleansing: Rinsed/Irrigated with Saline Topical Anesthetic Applied: Other: lidocaine 4%, Treatment Notes Wound #2 (Right, Lateral, Plantar Foot) 1. Cleansed with: Clean wound with Normal Saline 4. Dressing Applied: Prisma Ag 5. Secondary Dressing Applied Kerlix/Conform 6. Footwear/Offloading device applied Other footwear/offloading device  applied (specify in notes) Notes felt surrounding wound for off-loading Electronic Signature(s) Signed: 09/28/2015 8:31:05 AM By: Elpidio Eric BSN, RN Entered By: Elpidio Eric on 09/28/2015 08:31:05 Morissette, Jaceon L. ([id number]) -------------------------------------------------------------------------------- Vitals Details Patient Name: Jim [name]. Date of Service: 09/28/2015 8:00 AM Medical Record Number: [id number] Patient Account Number: [id number] Date of Birth/Sex: [birth year] (81 y.o. Male) Treating RN: Clover Mealy, RN, BSN, [place] Sink Primary Care Physician: Darreld Mclean Other Clinician: Referring Physician: Darreld Mclean Treating Physician/Extender: Rudene Re  in Treatment: 4 Vital Signs Time Taken: 08:25 Temperature (F): 97.9 Height (in): 73 Pulse (bpm): 69 Weight (lbs): 222 Respiratory Rate (breaths/min): 18 Body Mass Index (BMI): 29.3 Blood Pressure (mmHg): 118/60 Reference Range: 80 - 120 mg / dl Electronic Signature(s) Signed: 09/28/2015 8:26:59 AM By: Elpidio Eric BSN, RN Entered By: Elpidio Eric on 09/28/2015 08:26:59

## 2015-09-29 NOTE — Progress Notes (Signed)
[name], [name] ([id number]) Visit Report for 09/28/2015 Chief Complaint Document Details Patient Name: Jim [name], [name] Date of Service: 09/28/2015 8:00 AM Medical Record Number: [id number] Patient Account Number: [id number] Date of Birth/Sex: Jan 09, 1930 (81 y.o. Male) Treating RN: Clover Mealy, RN, BSN, [place]ink Primary Care Physician: Darreld Mclean Other Clinician: Referring Physician: Darreld Mclean Treating Physician/Extender: Rudene Re in Treatment: 4 Information Obtained from: Patient Chief Complaint Patients presents for treatment of an open diabetic ulcer. The patient noticed some blood on his sock and noticed that he had a ulcerated area on his right forefoot and his right fourth toe. This was about 3 weeks ago. Electronic Signature(s) Signed: 09/28/2015 8:45:12 AM By: Evlyn Kanner MD, FACS Entered By: Evlyn Kanner on 09/28/2015 08:45:12 Dacey, Alexanderjames L. ([id number]) -------------------------------------------------------------------------------- Debridement Details Patient Name: Jim [name], Brandol L. Date of Service: 09/28/2015 8:00 AM Medical Record Number: [id number] Patient Account Number: [id number] Date of Birth/Sex: Apr 08, 1930 (81 y.o. Male) Treating RN: Afful, RN, BSN, Rita Primary Care Physician: Darreld Mclean Other Clinician: Referring Physician: Darreld Mclean Treating Physician/Extender: Rudene Re in Treatment: 4 Debridement Performed for Wound #2 Right,Lateral,Plantar Foot Assessment: Performed By: Physician Tristan Schroeder., MD Debridement: Debridement Pre-procedure Yes Verification/Time Out Taken: Start Time: 08:39 Pain Control: Lidocaine 4% Topical Solution Level: Skin/Subcutaneous Tissue Total Area Debrided (L x 1.5 (cm) x 1.2 (cm) = 1.8 (cm) W): Tissue and other Viable, Non-Viable, Callus, Fibrin/Slough, Subcutaneous material debrided: Instrument: Curette Bleeding: Minimum Hemostasis Achieved: Pressure End Time: 08:43 Procedural Pain: 0 Post  Procedural Pain: 0 Response to Treatment: Procedure was tolerated well Post Debridement Measurements of Total Wound Length: (cm) 1.5 Width: (cm) 1.2 Depth: (cm) 0.2 Volume: (cm) 0.283 Post Procedure Diagnosis Same as Pre-procedure Electronic Signature(s) Signed: 09/28/2015 8:45:03 AM By: Evlyn Kanner MD, FACS Signed: 09/28/2015 2:24:20 PM By: Elpidio Eric BSN, RN Previous Signature: 09/28/2015 8:41:45 AM Version By: Elpidio Eric BSN, RN Entered By: Evlyn Kanner on 09/28/2015 08:45:03 Theissen, Nasiah L. ([id number]) -------------------------------------------------------------------------------- HPI Details Patient Name: Jim [name], [name] Date of Service: 09/28/2015 8:00 AM Medical Record Number: [id number] Patient Account Number: [id number] Date of Birth/Sex: Jul 10, 1930 (81 y.o. Male) Treating RN: Clover Mealy, RN, BSN, Rita Primary Care Physician: Darreld Mclean Other Clinician: Referring Physician: Darreld Mclean Treating Physician/Extender: Rudene Re in Treatment: 4 History of Present Illness Location: ulcerated area on the right foot plantar aspect and his right fourth toe Quality: Patient reports No Pain. Severity: Patient states wound are getting worse. Duration: Patient has had the wound for < 3 weeks prior to presenting for treatment Context: The wound appeared gradually over time Modifying Factors: Other treatment(s) tried include:the patient has been put on oral Levaquin for 10 days Associated Signs and Symptoms: Patient reports having increase swelling. HPI Description: The patient is a pleasant 81 year old with a past medical history significant for type 2 diabetes and peripheral vascular disease. He underwent a left below-the-knee amputation approximately 10 years ago after stepping on a nail. He has done well since that time and is able to ambulate with a prosthetic. He has had a vascular procedure on his right leg about 4-5 years ago and has never seen the doctor  again. His diabetes is under control and his last hemoglobin A1c was 7.7% measured in January 2016. His other comorbidities are hypertension, cellulitis of the foot, recent urinary tract infection. He was seen earlier for a wound problem about a year ago on his BKA stump. He has been referred back to the wound center by his PCP Dr. Darreld Mclean for  a moderately severe cellulitis of the right foot and right fourth toenail in a gentleman who is known to have PVD and is status post left BKA and has insulin-dependent diabetes mellitus. 09/07/2015 -- the patient has not yet got his appointment to see the vascular doctors and I have urged him to keep this. He's also been having some problems with home health coming to do his dressings and we will work with his agency to synchronize this. 09/14/2015 -- he has an appointment to see the vascular surgeons on Monday. 09/21/2015-- due to transport issues he was unable to keep his vascular appointment and is rescheduled for next week. 09/28/2015 -- he has had his vascular workup but we did not have the report yet. He did understand that he needs an arterial procedure and this has to be scheduled sometime soon. We will await the vascular report from the office. Electronic Signature(s) Signed: 09/28/2015 8:45:58 AM By: Evlyn Kanner MD, FACS Entered By: Evlyn Kanner on 09/28/2015 08:45:58 Fayad, May L. ([id number]) -------------------------------------------------------------------------------- Physical Exam Details Patient Name: Jim [name]. Date of Service: 09/28/2015 8:00 AM Medical Record Number: [id number] Patient Account Number: [id number] Date of Birth/Sex: [birth year] (81 y.o. Male) Treating RN: Clover Mealy, RN, BSN, [place] Sink Primary Care Physician: Darreld Mclean Other Clinician: Referring Physician: Darreld Mclean Treating Physician/Extender: Rudene Re in Treatment: 4 Constitutional . Pulse regular. Respirations normal and unlabored.  Afebrile. . Eyes Nonicteric. Reactive to light. Ears, Nose, Mouth, and Throat Lips, teeth, and gums WNL.Marland Kitchen Moist mucosa without lesions . Neck supple and nontender. No palpable supraclavicular or cervical adenopathy. Normal sized without goiter. Respiratory WNL. No retractions.. Cardiovascular Pedal Pulses WNL. No clubbing, cyanosis or edema. Lymphatic No adneopathy. No adenopathy. No adenopathy. Musculoskeletal Adexa without tenderness or enlargement.. Digits and nails w/o clubbing, cyanosis, infection, petechiae, ischemia, or inflammatory conditions.. Integumentary (Hair, Skin) No suspicious lesions. No crepitus or fluctuance. No peri-wound warmth or erythema. No masses.Marland Kitchen Psychiatric Judgement and insight Intact.. No evidence of depression, anxiety, or agitation.. Notes He needs sharp debridement as he has been Some callus and the edges need to be trimmed too. Electronic Signature(s) Signed: 09/28/2015 8:46:37 AM By: Evlyn Kanner MD, FACS Entered By: Evlyn Kanner on 09/28/2015 08:46:37 Davidoff, Marcelus L. ([id number]) -------------------------------------------------------------------------------- Physician Orders Details Patient Name: Jim [name]. Date of Service: 09/28/2015 8:00 AM Medical Record Number: [id number] Patient Account Number: [id number] Date of Birth/Sex: 1930-10-29 (81 y.o. Male) Treating RN: Clover Mealy, RN, BSN, [place] Sink Primary Care Physician: Darreld Mclean Other Clinician: Referring Physician: Darreld Mclean Treating Physician/Extender: Rudene Re in Treatment: 4 Verbal / Phone Orders: Yes Clinician: Afful, RN, BSN, Rita Read Back and Verified: Yes Diagnosis Coding Wound Cleansing Wound #2 Right,Lateral,Plantar Foot o Clean wound with Normal Saline. Anesthetic Wound #2 Right,Lateral,Plantar Foot o Topical Lidocaine 4% cream applied to wound bed prior to debridement Primary Wound Dressing Wound #2 Right,Lateral,Plantar Foot o Prisma Ag Secondary  Dressing o Gauze and Kerlix/Conform Dressing Change Frequency Wound #2 Right,Lateral,Plantar Foot o Change dressing every other day. Follow-up Appointments Wound #2 Right,Lateral,Plantar Foot o Return Appointment in 2 weeks. Off-Loading Wound #2 Right,Lateral,Plantar Foot o Open toe surgical shoe with peg assist. - darco o Other: - felt Additional Orders / Instructions Wound #2 Right,Lateral,Plantar Foot o Increase protein intake. o Activity as tolerated Home Health Wound #2 Right,Lateral,Plantar Foot Delange, Agamjot L. ([id number]) o Continue Home Health Visits o Home Health Nurse may visit PRN to address patientos wound care needs. o FACE TO FACE ENCOUNTER: MEDICARE and MEDICAID PATIENTS: I certify  that this patient is under my care and that I had a face-to-face encounter that meets the physician face-to-face encounter requirements with this patient on this date. The encounter with the patient was in whole or in part for the following MEDICAL CONDITION: (primary reason for Home Healthcare) MEDICAL NECESSITY: I certify, that based on my findings, NURSING services are a medically necessary home health service. HOME BOUND STATUS: I certify that my clinical findings support that this patient is homebound (i.e., Due to illness or injury, pt requires aid of supportive devices such as crutches, cane, wheelchairs, walkers, the use of special transportation or the assistance of another person to leave their place of residence. There is a normal inability to leave the home and doing so requires considerable and taxing effort. Other absences are for medical reasons / religious services and are infrequent or of short duration when for other reasons). o If current dressing causes regression in wound condition, may D/C ordered dressing product/s and apply Normal Saline Moist Dressing daily until next Wound Healing Center / Other MD appointment. Notify Wound Healing Center of  regression in wound condition at [phone]. o Please direct any NON-WOUND related issues/requests for orders to patient's Primary Care Physician Electronic Signature(s) Signed: 09/28/2015 8:48:47 AM By: Elpidio Eric BSN, RN Signed: 09/28/2015 4:17:16 PM By: Evlyn Kanner MD, FACS Previous Signature: 09/28/2015 8:43:16 AM Version By: Elpidio Eric BSN, RN Entered By: Elpidio Eric on 09/28/2015 08:48:47 Wren, Aman L. ([id number]) -------------------------------------------------------------------------------- Problem List Details Patient Name: Jim [name], West L. Date of Service: 09/28/2015 8:00 AM Medical Record Number: [id number] Patient Account Number: [id number] Date of Birth/Sex: 24-Mar-1930 (81 y.o. 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 ([id number]) -------------------------------------------------------------------------------- Progress Note Details Patient Name: Jim [name]. Date of Service: 09/28/2015 8:00 AM Medical Record Number: [id number] Patient Account Number: [id number] Date of Birth/Sex: 07-18-1930 (81 y.o. Male) Treating RN: Clover Mealy, RN, BSN, Rita Primary Care Physician: Darreld Mclean Other Clinician: Referring Physician: Darreld Mclean Treating  Physician/Extender: Rudene Re in Treatment: 4 Subjective Chief Complaint Information obtained from Patient Patients presents for treatment of an open diabetic ulcer. The patient noticed some blood on his sock and noticed that he had a ulcerated area on his right forefoot and his right fourth toe. This was about 3 weeks ago. History of Present Illness (HPI) The following HPI elements were documented for the patient's wound: Location: ulcerated area on the right foot plantar aspect and his right fourth toe Quality: Patient reports No Pain. Severity: Patient states wound are getting worse. Duration: Patient has had the wound for < 3 weeks prior to presenting for treatment Context: The wound appeared gradually over time Modifying Factors: Other treatment(s) tried include:the patient has been put on oral Levaquin for 10 days Associated Signs and Symptoms: Patient reports having increase swelling. The patient is a pleasant 81 year old with a past medical history significant for type 2 diabetes and peripheral vascular disease.  He underwent a left below-the-knee amputation approximately 10 years ago after stepping on a nail. He has done well since that time and is able to ambulate with a prosthetic. He has had a vascular procedure on his right leg about 4-5 years ago and has never seen the doctor again. His diabetes is under control and his last hemoglobin A1c was 7.7% measured in January 2016. His other comorbidities are hypertension, cellulitis of the foot, recent urinary tract infection. He was seen earlier for a wound problem about a year ago on his BKA stump. He has been referred back to the wound center by his PCP Dr. Darreld Mclean for a moderately severe cellulitis of the right foot and right fourth toenail in a gentleman who is known to have PVD and is status post left BKA and has insulin-dependent diabetes mellitus. 09/07/2015 -- the patient has not yet got his appointment to see  the vascular doctors and I have urged him to keep this. He's also been having some problems with home health coming to do his dressings and we will work with his agency to synchronize this. 09/14/2015 -- he has an appointment to see the vascular surgeons on Monday. 09/21/2015-- due to transport issues he was unable to keep his vascular appointment and is rescheduled for next week. 09/28/2015 -- he has had his vascular workup but we did not have the report yet. He did understand that he needs an arterial procedure and this has to be scheduled sometime soon. We will await the vascular report from the office. Juran, Kellar L. ([id number]) Objective Constitutional Pulse regular. Respirations normal and unlabored. Afebrile. Vitals Time Taken: 8:25 AM, Height: 73 in, Weight: 222 lbs, BMI: 29.3, Temperature: 97.9 F, Pulse: 69 bpm, Respiratory Rate: 18 breaths/min, Blood Pressure: 118/60 mmHg. Eyes Nonicteric. Reactive to light. Ears, Nose, Mouth, and Throat Lips, teeth, and gums WNL.Marland Kitchen Moist mucosa without lesions . Neck supple and nontender. No palpable supraclavicular or cervical adenopathy. Normal sized without goiter. Respiratory WNL. No retractions.. Cardiovascular Pedal Pulses WNL. No clubbing, cyanosis or edema. Lymphatic No adneopathy. No adenopathy. No adenopathy. Musculoskeletal Adexa without tenderness or enlargement.. Digits and nails w/o clubbing, cyanosis, infection, petechiae, ischemia, or inflammatory conditions.Marland Kitchen Psychiatric Judgement and insight Intact.. No evidence of depression, anxiety, or agitation.. General Notes: He needs sharp debridement as he has been Some callus and the edges need to be trimmed too. Integumentary (Hair, Skin) No suspicious lesions. No crepitus or fluctuance. No peri-wound warmth or erythema. No masses.. Wound #2 status is Open. Original cause of wound was Footwear Injury. The wound is located on the Right,Lateral,Plantar Foot. The wound  measures 1.5cm length x 1.2cm width x 0.2cm depth; 1.414cm^2 area and 0.283cm^3 volume. The wound is limited to skin breakdown. There is no tunneling or undermining noted. There is a small amount of serosanguineous drainage noted. The wound margin is thickened. There is medium (34-66%) pink, pale granulation within the wound bed. There is a small (1-33%) amount of necrotic tissue within the wound bed including Adherent Slough. The periwound skin appearance exhibited: Spillers, Edin L. ([id number]) Callus, Moist. The periwound skin appearance did not exhibit: Crepitus, Excoriation, Fluctuance, Friable, Induration, Localized Edema, Rash, Scarring, Dry/Scaly, Maceration, Atrophie Blanche, Cyanosis, Ecchymosis, Hemosiderin Staining, Mottled, Pallor, Rubor, Erythema. Periwound temperature was noted as No Abnormality. Assessment Active Problems ICD-10 E11.621 - Type 2 diabetes mellitus with foot ulcer Z89.512 - Acquired absence of left leg below knee I70.25 - Atherosclerosis of native arteries of other extremities with ulceration L97.512 -  Non-pressure chronic ulcer of other part of right foot with fat layer exposed We will continue with Prisma Ag a felt ring and an offloading shoe. I have urged him to contact the vascular surgeon's office and schedule his procedure as soon as possible. He also is urged to keep his diabetes under control. He will come back and see as next week. Procedures Wound #2 Wound #2 is a Diabetic Wound/Ulcer of the Lower Extremity located on the Right,Lateral,Plantar Foot . There was a Skin/Subcutaneous Tissue Debridement ([id number]) debridement with total area of 1.8 sq cm performed by Tristan Schroeder., MD. with the following instrument(s): Curette to remove Viable and Non- Viable tissue/material including Fibrin/Slough, Callus, and Subcutaneous after achieving pain control using Lidocaine 4% Topical Solution. A time out was conducted prior to the start of the procedure.  A Minimum amount of bleeding was controlled with Pressure. The procedure was tolerated well with a pain level of 0 throughout and a pain level of 0 following the procedure. Post Debridement Measurements: 1.5cm length x 1.2cm width x 0.2cm depth; 0.283cm^3 volume. Post procedure Diagnosis Wound #2: Same as Pre-Procedure Raine, Jerson L. ([id number]) Plan Wound Cleansing: Wound #2 Right,Lateral,Plantar Foot: Clean wound with Normal Saline. Anesthetic: Wound #2 Right,Lateral,Plantar Foot: Topical Lidocaine 4% cream applied to wound bed prior to debridement Primary Wound Dressing: Wound #2 Right,Lateral,Plantar Foot: Prisma Ag Secondary Dressing: Gauze and Kerlix/Conform Dressing Change Frequency: Wound #2 Right,Lateral,Plantar Foot: Change dressing every other day. Follow-up Appointments: Wound #2 Right,Lateral,Plantar Foot: Return Appointment in 1 week. Off-Loading: Wound #2 Right,Lateral,Plantar Foot: Open toe surgical shoe with peg assist. - darco Other: - felt Additional Orders / Instructions: Wound #2 Right,Lateral,Plantar Foot: Increase protein intake. Activity as tolerated We will continue with Prisma Ag a felt ring and an offloading shoe. I have urged him to contact the vascular surgeon's office and schedule his procedure as soon as possible. He also is urged to keep his diabetes under control. He will come back and see as next week. Electronic Signature(s) Signed: 09/28/2015 9:02:12 AM By: Evlyn Kanner MD, FACS Entered By: Evlyn Kanner on 09/28/2015 09:02:12 Delsol, Cresencio L. ([id number]) -------------------------------------------------------------------------------- SuperBill Details Patient Name: Jim [name], Conal L. Date of Service: 09/28/2015 Medical Record Number: [id number] Patient Account Number: [id number] Date of Birth/Sex: 1930/12/07 (81 y.o. Male) Treating RN: Afful, RN, BSN, Rita Primary Care Physician: Darreld Mclean Other Clinician: Referring Physician: Darreld Mclean Treating Physician/Extender: Rudene Re in Treatment: 4 Diagnosis Coding ICD-10 Codes Code Description E11.621 Type 2 diabetes mellitus with foot ulcer Z89.512 Acquired absence of left leg below knee I70.25 Atherosclerosis of native arteries of other extremities with ulceration L97.512 Non-pressure chronic ulcer of other part of right foot with fat layer exposed Facility Procedures CPT4 Code Description: [id number] 11042 - DEB SUBQ TISSUE 20 SQ CM/< ICD-10 Description Diagnosis E11.621 Type 2 diabetes mellitus with foot ulcer L97.512 Non-pressure chronic ulcer of other part of right foo I70.25 Atherosclerosis of native arteries of  other extremiti Modifier: t with fat la es with ulcer Quantity: 1 yer exposed ation Physician Procedures CPT4 Code Description: 2440102 11042 - WC PHYS SUBQ TISS 20 SQ CM ICD-10 Description Diagnosis E11.621 Type 2 diabetes mellitus with foot ulcer L97.512 Non-pressure chronic ulcer of other part of right foo I70.25 Atherosclerosis of native arteries of  other extremiti Modifier: t with fat lay es with ulcera Quantity: 1 er exposed Solicitor Signature(s) Signed: 09/28/2015 9:02:24 AM By: Evlyn Kanner MD, FACS Entered By: Evlyn Kanner on 09/28/2015 09:02:24

## 2015-10-02 ENCOUNTER — Other Ambulatory Visit: Payer: Self-pay | Admitting: Vascular Surgery

## 2015-10-04 ENCOUNTER — Encounter: Admission: RE | Payer: Self-pay | Source: Ambulatory Visit

## 2015-10-04 ENCOUNTER — Ambulatory Visit: Admission: RE | Admit: 2015-10-04 | Payer: Medicare HMO | Source: Ambulatory Visit | Admitting: Vascular Surgery

## 2015-10-04 SURGERY — LOWER EXTREMITY ANGIOGRAPHY
Anesthesia: Moderate Sedation | Laterality: Right

## 2015-10-12 ENCOUNTER — Encounter: Payer: Medicare HMO | Attending: Surgery | Admitting: Surgery

## 2015-10-12 DIAGNOSIS — I7025 Atherosclerosis of native arteries of other extremities with ulceration: Secondary | ICD-10-CM | POA: Diagnosis not present

## 2015-10-12 DIAGNOSIS — M1990 Unspecified osteoarthritis, unspecified site: Secondary | ICD-10-CM | POA: Insufficient documentation

## 2015-10-12 DIAGNOSIS — Z89512 Acquired absence of left leg below knee: Secondary | ICD-10-CM | POA: Insufficient documentation

## 2015-10-12 DIAGNOSIS — Z87891 Personal history of nicotine dependence: Secondary | ICD-10-CM | POA: Insufficient documentation

## 2015-10-12 DIAGNOSIS — I10 Essential (primary) hypertension: Secondary | ICD-10-CM | POA: Diagnosis not present

## 2015-10-12 DIAGNOSIS — Z794 Long term (current) use of insulin: Secondary | ICD-10-CM | POA: Insufficient documentation

## 2015-10-12 DIAGNOSIS — E11621 Type 2 diabetes mellitus with foot ulcer: Secondary | ICD-10-CM | POA: Diagnosis not present

## 2015-10-12 DIAGNOSIS — L97512 Non-pressure chronic ulcer of other part of right foot with fat layer exposed: Secondary | ICD-10-CM | POA: Insufficient documentation

## 2015-10-12 NOTE — Progress Notes (Signed)
[name], [name] ([id number]) Visit Report for 10/12/2015 Chief Complaint Document Details Patient Name: Jim [name], [name] Date of Service: 10/12/2015 8:00 AM Medical Record Number: [id number] Patient Account Number: [id number] Date of Birth/Sex: 1930/09/27 (81 y.o. Male) Treating RN: Clover Mealy, RN, BSN, [place] Sink Primary Care Physician: Darreld Mclean Other Clinician: Referring Physician: Darreld Mclean Treating Physician/Extender: Rudene Re in Treatment: 6 Information Obtained from: Patient Chief Complaint Patients presents for treatment of an open diabetic ulcer. The patient noticed some blood on his sock and noticed that he had a ulcerated area on his right forefoot and his right fourth toe. This was about 3 weeks ago. Electronic Signature(s) Signed: 10/12/2015 8:46:27 AM By: Evlyn Kanner MD, FACS Entered By: Evlyn Kanner on 10/12/2015 08:46:27 [name], [name] ([id number]) -------------------------------------------------------------------------------- HPI Details Patient Name: Jim [name]. Date of Service: 10/12/2015 8:00 AM Medical Record Number: [id number] Patient Account Number: [id number] Date of Birth/Sex: Dec 15, 1930 (81 y.o. Male) Treating RN: Clover Mealy, RN, BSN,  Sink Primary Care Physician: Darreld Mclean Other Clinician: Referring Physician: Darreld Mclean Treating Physician/Extender: Rudene Re in Treatment: 6 History of Present Illness Location: ulcerated area on the right foot plantar aspect and his right fourth toe Quality: Patient reports No Pain. Severity: Patient states wound are getting worse. Duration: Patient has had the wound for < 3 weeks prior to presenting for treatment Context: The wound appeared gradually over time Modifying Factors: Other treatment(s) tried include:the patient has been put on oral Levaquin for 10 days Associated Signs and Symptoms: Patient reports having increase swelling. HPI Description: The patient is a pleasant 81 year old with  a past medical history significant for type 2 diabetes and peripheral vascular disease. He underwent a left below-the-knee amputation approximately 10 years ago after stepping on a nail. He has done well since that time and is able to ambulate with a prosthetic. He has had a vascular procedure on his right leg about 4-5 years ago and has never seen the doctor again. His diabetes is under control and his last hemoglobin A1c was 7.7% measured in January 2016. His other comorbidities are hypertension, cellulitis of the foot, recent urinary tract infection. He was seen earlier for a wound problem about a year ago on his BKA stump. He has been referred back to the wound center by his PCP Dr. Darreld Mclean for a moderately severe cellulitis of the right foot and right fourth toenail in a gentleman who is known to have PVD and is status post left BKA and has insulin-dependent diabetes mellitus. 09/07/2015 -- the patient has not yet got his appointment to see the vascular doctors and I have urged him to keep this. He's also been having some problems with home health coming to do his dressings and we will work with his agency to synchronize this. 09/14/2015 -- he has an appointment to see the vascular surgeons on Monday. 09/21/2015-- due to transport issues he was unable to keep his vascular appointment and is rescheduled for next week. 09/28/2015 -- he has had his vascular workup but we did not have the report yet. He did understand that he needs an arterial procedure and this has to be scheduled sometime soon. We will await the vascular report from the office. 10/12/2015 -- the patient had his arterial duplex done recently and this showed right tibial artery occlusive disease without evidence of any blockage of the right femoral and popliteal artery stent. He was asked to have a right lower extremity angiogram with possible intervention and the patient is aware of  the plan. However he has several  social and economic problems to get to the hospital and is trying to fix his appointment as soon as possible. Electronic Signature(s) Signed: 10/12/2015 8:48:36 AM By: Evlyn Kanner MD, FACS Entered By: Evlyn Kanner on 10/12/2015 08:48:36 Jim [name], [name] ([id number]) Jim [name], [name] ([id number]) -------------------------------------------------------------------------------- Physical Exam Details Patient Name: Jim [name], [name] Date of Service: 10/12/2015 8:00 AM Medical Record Number: [id number] Patient Account Number: [id number] Date of Birth/Sex: 04/26/1930 (81 y.o. Male) Treating RN: Clover Mealy, RN, BSN, [place] Sink Primary Care Physician: Darreld Mclean Other Clinician: Referring Physician: Darreld Mclean Treating Physician/Extender: Rudene Re in Treatment: 6 Constitutional . Pulse regular. Respirations normal and unlabored. Afebrile. . Eyes Nonicteric. Reactive to light. Ears, Nose, Mouth, and Throat Lips, teeth, and gums WNL.Marland Kitchen Moist mucosa without lesions . Neck supple and nontender. No palpable supraclavicular or cervical adenopathy. Normal sized without goiter. Respiratory WNL. No retractions.. Cardiovascular Pedal Pulses WNL. No clubbing, cyanosis or edema. Lymphatic No adneopathy. No adenopathy. No adenopathy. Musculoskeletal Adexa without tenderness or enlargement.. Digits and nails w/o clubbing, cyanosis, infection, petechiae, ischemia, or inflammatory conditions.. Integumentary (Hair, Skin) No suspicious lesions. No crepitus or fluctuance. No peri-wound warmth or erythema. No masses.Marland Kitchen Psychiatric Judgement and insight Intact.. No evidence of depression, anxiety, or agitation.. Notes the wound is looking cleaner and smaller and that is not much callus. He overall has improved. Electronic Signature(s) Signed: 10/12/2015 8:57:01 AM By: Evlyn Kanner MD, FACS Entered By: Evlyn Kanner on 10/12/2015 08:57:01 Jim [name], [name]  ([id number]) -------------------------------------------------------------------------------- Physician Orders Details Patient Name: Jim [name]. Date of Service: 10/12/2015 8:00 AM Medical Record Number: [id number] Patient Account Number: [id number] Date of Birth/Sex: 1930/10/06 (81 y.o. Male) Treating RN: Clover Mealy, RN, BSN,  Sink Primary Care Physician: Darreld Mclean Other Clinician: Referring Physician: Darreld Mclean Treating Physician/Extender: Rudene Re in Treatment: 6 Verbal / Phone Orders: Yes Clinician: Afful, RN, BSN, Rita Read Back and Verified: Yes Diagnosis Coding Wound Cleansing Wound #2 Right,Lateral,Plantar Foot o Clean wound with Normal Saline. Anesthetic Wound #2 Right,Lateral,Plantar Foot o Topical Lidocaine 4% cream applied to wound bed prior to debridement Primary Wound Dressing Wound #2 Right,Lateral,Plantar Foot o Prisma Ag Secondary Dressing o Gauze and Kerlix/Conform Dressing Change Frequency Wound #2 Right,Lateral,Plantar Foot o Change dressing every other day. Follow-up Appointments Wound #2 Right,Lateral,Plantar Foot o Return Appointment in 2 weeks. Off-Loading Wound #2 Right,Lateral,Plantar Foot o Open toe surgical shoe with peg assist. - darco o Other: - felt Additional Orders / Instructions Wound #2 Right,Lateral,Plantar Foot o Increase protein intake. o Activity as tolerated Home Health Wound #2 Right,Lateral,Plantar Foot Jim [name], [name] ([id number]) o Continue Home Health Visits o Home Health Nurse may visit PRN to address patientos wound care needs. o FACE TO FACE ENCOUNTER: MEDICARE and MEDICAID PATIENTS: I certify that this patient is under my care and that I had a face-to-face encounter that meets the physician face-to-face encounter requirements with this patient on this date. The encounter with the patient was in whole or in part for the following MEDICAL CONDITION: (primary reason for Home  Healthcare) MEDICAL NECESSITY: I certify, that based on my findings, NURSING services are a medically necessary home health service. HOME BOUND STATUS: I certify that my clinical findings support that this patient is homebound (i.e., Due to illness or injury, pt requires aid of supportive devices such as crutches, cane, wheelchairs, walkers, the use of special transportation or the assistance of another person to leave their place of residence. There is a normal inability to leave  the home and doing so requires considerable and taxing effort. Other absences are for medical reasons / religious services and are infrequent or of short duration when for other reasons). o If current dressing causes regression in wound condition, may D/C ordered dressing product/s and apply Normal Saline Moist Dressing daily until next Wound Healing Center / Other MD appointment. Notify Wound Healing Center of regression in wound condition at [phone]. o Please direct any NON-WOUND related issues/requests for orders to patient's Primary Care Physician Electronic Signature(s) Signed: 10/12/2015 8:43:58 AM By: Elpidio Eric BSN, RN Signed: 10/12/2015 4:02:00 PM By: Evlyn Kanner MD, FACS Entered By: Elpidio Eric on 10/12/2015 08:43:58 Jim [name], [name] ([id number]) -------------------------------------------------------------------------------- Problem List Details Patient Name: Jim [name], Upton L. Date of Service: 10/12/2015 8:00 AM Medical Record Number: [id number] Patient Account Number: [id number] Date of Birth/Sex: 22-Apr-1930 (81 y.o. Male) Treating RN: Clover Mealy, RN, BSN, Rita Primary Care Physician: Darreld Mclean Other Clinician: Referring Physician: Darreld Mclean Treating Physician/Extender: Rudene Re in Treatment: 6 Active Problems ICD-10 Encounter Code Description Active Date Diagnosis E11.621 Type 2 diabetes mellitus with foot ulcer 08/30/2015 Yes Z89.512 Acquired absence of left leg below knee  08/30/2015 Yes I70.25 Atherosclerosis of native arteries of other extremities with 08/30/2015 Yes ulceration L97.512 Non-pressure chronic ulcer of other part of right foot with 08/30/2015 Yes fat layer exposed Inactive Problems Resolved Problems Electronic Signature(s) Signed: 10/12/2015 8:46:21 AM By: Evlyn Kanner MD, FACS Entered By: Evlyn Kanner on 10/12/2015 08:46:21 Jim [name], [name] ([id number]) -------------------------------------------------------------------------------- Progress Note Details Patient Name: Jim [name]. Date of Service: 10/12/2015 8:00 AM Medical Record Number: [id number] Patient Account Number: [id number] Date of Birth/Sex: 09-22-1930 (81 y.o. Male) Treating RN: Clover Mealy, RN, BSN, Rita Primary Care Physician: Darreld Mclean Other Clinician: Referring Physician: Darreld Mclean Treating Physician/Extender: Rudene Re in Treatment: 6 Subjective Chief Complaint Information obtained from Patient Patients presents for treatment of an open diabetic ulcer. The patient noticed some blood on his sock and noticed that he had a ulcerated area on his right forefoot and his right fourth toe. This was about 3 weeks ago. History of Present Illness (HPI) The following HPI elements were documented for the patient's wound: Location: ulcerated area on the right foot plantar aspect and his right fourth toe Quality: Patient reports No Pain. Severity: Patient states wound are getting worse. Duration: Patient has had the wound for < 3 weeks prior to presenting for treatment Context: The wound appeared gradually over time Modifying Factors: Other treatment(s) tried include:the patient has been put on oral Levaquin for 10 days Associated Signs and Symptoms: Patient reports having increase swelling. The patient is a pleasant 81 year old with a past medical history significant for type 2 diabetes and peripheral vascular disease. He underwent a left below-the-knee amputation  approximately 10 years ago after stepping on a nail. He has done well since that time and is able to ambulate with a prosthetic. He has had a vascular procedure on his right leg about 4-5 years ago and has never seen the doctor again. His diabetes is under control and his last hemoglobin A1c was 7.7% measured in January 2016. His other comorbidities are hypertension, cellulitis of the foot, recent urinary tract infection. He was seen earlier for a wound problem about a year ago on his BKA stump. He has been referred back to the wound center by his PCP Dr. Darreld Mclean for a moderately severe cellulitis of the right foot and right fourth toenail in a gentleman who is known to have PVD and  is status post left BKA and has insulin-dependent diabetes mellitus. 09/07/2015 -- the patient has not yet got his appointment to see the vascular doctors and I have urged him to keep this. He's also been having some problems with home health coming to do his dressings and we will work with his agency to synchronize this. 09/14/2015 -- he has an appointment to see the vascular surgeons on Monday. 09/21/2015-- due to transport issues he was unable to keep his vascular appointment and is rescheduled for next week. 09/28/2015 -- he has had his vascular workup but we did not have the report yet. He did understand that he needs an arterial procedure and this has to be scheduled sometime soon. We will await the vascular report from the office. 10/12/2015 -- the patient had his arterial duplex done recently and this showed right tibial artery occlusive Jim [name], [name] ([id number]) disease without evidence of any blockage of the right femoral and popliteal artery stent. He was asked to have a right lower extremity angiogram with possible intervention and the patient is aware of the plan. However he has several social and economic problems to get to the hospital and is trying to fix his appointment as soon as  possible. Objective Constitutional Pulse regular. Respirations normal and unlabored. Afebrile. Vitals Time Taken: 8:30 AM, Height: 73 in, Weight: 222 lbs, BMI: 29.3, Temperature: 97.5 F, Pulse: 75 bpm, Respiratory Rate: 18 breaths/min, Blood Pressure: 162/81 mmHg. Eyes Nonicteric. Reactive to light. Ears, Nose, Mouth, and Throat Lips, teeth, and gums WNL.Marland Kitchen Moist mucosa without lesions . Neck supple and nontender. No palpable supraclavicular or cervical adenopathy. Normal sized without goiter. Respiratory WNL. No retractions.. Cardiovascular Pedal Pulses WNL. No clubbing, cyanosis or edema. Lymphatic No adneopathy. No adenopathy. No adenopathy. Musculoskeletal Adexa without tenderness or enlargement.. Digits and nails w/o clubbing, cyanosis, infection, petechiae, ischemia, or inflammatory conditions.Marland Kitchen Psychiatric Judgement and insight Intact.. No evidence of depression, anxiety, or agitation.. General Notes: the wound is looking cleaner and smaller and that is not much callus. He overall has improved. Integumentary (Hair, Skin) No suspicious lesions. No crepitus or fluctuance. No peri-wound warmth or erythema. No masses.. Jim [name], [name] ([id number]) Wound #2 status is Open. Original cause of wound was Footwear Injury. The wound is located on the Right,Lateral,Plantar Foot. The wound measures 0.6cm length x 0.9cm width x 0.1cm depth; 0.424cm^2 area and 0.042cm^3 volume. The wound is limited to skin breakdown. There is no tunneling or undermining noted. There is a small amount of serosanguineous drainage noted. The wound margin is thickened. There is medium (34-66%) pink, pale granulation within the wound bed. There is a small (1-33%) amount of necrotic tissue within the wound bed including Adherent Slough. The periwound skin appearance exhibited: Callus, Moist. The periwound skin appearance did not exhibit: Crepitus, Excoriation, Fluctuance, Friable, Induration, Localized Edema,  Rash, Scarring, Dry/Scaly, Maceration, Atrophie Blanche, Cyanosis, Ecchymosis, Hemosiderin Staining, Mottled, Pallor, Rubor, Erythema. Periwound temperature was noted as No Abnormality. Assessment Active Problems ICD-10 E11.621 - Type 2 diabetes mellitus with foot ulcer Z89.512 - Acquired absence of left leg below knee I70.25 - Atherosclerosis of native arteries of other extremities with ulceration L97.512 - Non-pressure chronic ulcer of other part of right foot with fat layer exposed We will continue with Prisma Ag a felt ring and an offloading shoe. I have urged him to contact the vascular surgeon's office and schedule his procedure as soon as possible. he has several social and economic problems which prevented him from getting to the hospital  for his procedure and I have asked him to talk to social services so that transport can be arranged and help can be gotten at home. He also is urged to keep his diabetes under control. He will come back and see as next week. Plan Wound Cleansing: Wound #2 Right,Lateral,Plantar Foot: Clean wound with Normal Saline. Anesthetic: Jim [name], [name] ([id number]) Wound #2 Right,Lateral,Plantar Foot: Topical Lidocaine 4% cream applied to wound bed prior to debridement Primary Wound Dressing: Wound #2 Right,Lateral,Plantar Foot: Prisma Ag Secondary Dressing: Gauze and Kerlix/Conform Dressing Change Frequency: Wound #2 Right,Lateral,Plantar Foot: Change dressing every other day. Follow-up Appointments: Wound #2 Right,Lateral,Plantar Foot: Return Appointment in 2 weeks. Off-Loading: Wound #2 Right,Lateral,Plantar Foot: Open toe surgical shoe with peg assist. - darco Other: - felt Additional Orders / Instructions: Wound #2 Right,Lateral,Plantar Foot: Increase protein intake. Activity as tolerated Home Health: Wound #2 Right,Lateral,Plantar Foot: Continue Home Health Visits Home Health Nurse may visit PRN to address patient s wound care  needs. FACE TO FACE ENCOUNTER: MEDICARE and MEDICAID PATIENTS: I certify that this patient is under my care and that I had a face-to-face encounter that meets the physician face-to-face encounter requirements with this patient on this date. The encounter with the patient was in whole or in part for the following MEDICAL CONDITION: (primary reason for Home Healthcare) MEDICAL NECESSITY: I certify, that based on my findings, NURSING services are a medically necessary home health service. HOME BOUND STATUS: I certify that my clinical findings support that this patient is homebound (i.e., Due to illness or injury, pt requires aid of supportive devices such as crutches, cane, wheelchairs, walkers, the use of special transportation or the assistance of another person to leave their place of residence. There is a normal inability to leave the home and doing so requires considerable and taxing effort. Other absences are for medical reasons / religious services and are infrequent or of short duration when for other reasons). If current dressing causes regression in wound condition, may D/C ordered dressing product/s and apply Normal Saline Moist Dressing daily until next Wound Healing Center / Other MD appointment. Notify Wound Healing Center of regression in wound condition at [phone]. Please direct any NON-WOUND related issues/requests for orders to patient's Primary Care Physician We will continue with Prisma Ag a felt ring and an offloading shoe. I have urged him to contact the vascular surgeon's office and schedule his procedure as soon as possible. he has several social and economic problems which prevented him from getting to the hospital for his Jim [name], [name] ([id number]) procedure and I have asked him to talk to social services so that transport can be arranged and help can be gotten at home. He also is urged to keep his diabetes under control. He will come back and see as next  week. Electronic Signature(s) Signed: 10/12/2015 8:57:55 AM By: Evlyn KannerBritto, Serenitee Fuertes MD, FACS Entered By: Evlyn KannerBritto, Latona Krichbaum on 10/12/2015 08:57:55 Jim [name], Benard L. ([id number]) -------------------------------------------------------------------------------- SuperBill Details Patient Name: Jim NevinOBLE, Corbet L. Date of Service: 10/12/2015 Medical Record Number: [id number] Patient Account Number: [id number] Date of Birth/Sex: 1930/08/11 17(81 y.o. Male) Treating RN: Afful, RN, BSN, Rita Primary Care Physician: Darreld McleanMILES, LINDA Other Clinician: Referring Physician: Darreld McleanMILES, LINDA Treating Physician/Extender: Rudene ReBritto, Kaylany Tesoriero Weeks in Treatment: 6 Diagnosis Coding ICD-10 Codes Code Description E11.621 Type 2 diabetes mellitus with foot ulcer Z89.512 Acquired absence of left leg below knee I70.25 Atherosclerosis of native arteries of other extremities with ulceration L97.512 Non-pressure chronic ulcer of other part of right foot with fat layer  exposed Facility Procedures CPT4 Code: 16109604 Description: 54098 - WOUND CARE VISIT-LEV 2 EST PT Modifier: Quantity: 1 Electronic Signature(s) Signed: 10/12/2015 8:44:50 AM By: Elpidio Eric BSN, RN Signed: 10/12/2015 4:02:00 PM By: Evlyn Kanner MD, FACS Entered By: Elpidio Eric on 10/12/2015 08:44:50

## 2015-10-12 NOTE — Progress Notes (Signed)
[name], [name] ([id number]) Visit Report for 10/12/2015 Arrival Information Details Patient Name: Jim [name], [name] Date of Service: 10/12/2015 8:00 AM Medical Record Number: [id number] Patient Account Number: [id number] Date of Birth/Sex: 06-Jun-1930 [age] y.o. Male) Treating RN: Clover MealyAfful, RN, BSN, [place] Primary Care Physician: Darreld McleanMILES, LINDA Other Clinician: Referring Physician: Darreld McleanMILES, LINDA Treating Physician/Extender: Rudene ReBritto, Errol Weeks in Treatment: 6 Visit Information History Since Last Visit Any new allergies or adverse reactions: No Patient Arrived: Wheel Chair Had a fall or experienced change in No activities of daily living that may affect Arrival Time: 08:26 risk of falls: Accompanied By: self Signs or symptoms of abuse/neglect since last No Transfer Assistance: None visito Patient Identification Verified: Yes Hospitalized since last visit: No Secondary Verification Process Yes Has Dressing in Place as Prescribed: Yes Completed: Pain Present Now: No Patient Requires Transmission-Based No Precautions: Patient Has Alerts: No Electronic Signature(s) Signed: 10/12/2015 8:27:19 AM By: Elpidio EricAfful, Rita BSN, RN Entered By: Elpidio EricAfful, Rita on 10/12/2015 08:27:19 Bellis, Ali L. ([id number]) -------------------------------------------------------------------------------- Clinic Level of Care Assessment Details Patient Name: [name], [name] Date of Service: 10/12/2015 8:00 AM Medical Record Number: [id number] Patient Account Number: [id number] Date of Birth/Sex: 06-Jun-1930 [age] y.o. Male) Treating RN: Clover MealyAfful, RN, BSN, Rita Primary Care Physician: Darreld McleanMILES, LINDA Other Clinician: Referring Physician: Darreld McleanMILES, LINDA Treating Physician/Extender: Rudene ReBritto, Errol Weeks in Treatment: 6 Clinic Level of Care Assessment Items TOOL 4 Quantity Score []  - Use when only an EandM is performed on FOLLOW-UP visit 0 ASSESSMENTS - Nursing Assessment / Reassessment X - Reassessment of Co-morbidities (includes  updates in patient status) 1 10 X - Reassessment of Adherence to Treatment Plan 1 5 ASSESSMENTS - Wound and Skin Assessment / Reassessment X - Simple Wound Assessment / Reassessment - one wound 1 5 []  - Complex Wound Assessment / Reassessment - multiple wounds 0 []  - Dermatologic / Skin Assessment (not related to wound area) 0 ASSESSMENTS - Focused Assessment []  - Circumferential Edema Measurements - multi extremities 0 []  - Nutritional Assessment / Counseling / Intervention 0 X - Lower Extremity Assessment (monofilament, tuning fork, pulses) 1 5 []  - Peripheral Arterial Disease Assessment (using hand held doppler) 0 ASSESSMENTS - Ostomy and/or Continence Assessment and Care []  - Incontinence Assessment and Management 0 []  - Ostomy Care Assessment and Management (repouching, etc.) 0 PROCESS - Coordination of Care X - Simple Patient / Family Education for ongoing care 1 15 []  - Complex (extensive) Patient / Family Education for ongoing care 0 []  - Staff obtains ChiropractorConsents, Records, Test Results / Process Orders 0 []  - Staff telephones HHA, Nursing Homes / Clarify orders / etc 0 []  - Routine Transfer to another Facility (non-emergent condition) 0 [name], [name] ([id number]) []  - Routine Hospital Admission (non-emergent condition) 0 []  - New Admissions / Manufacturing engineernsurance Authorizations / Ordering NPWT, Apligraf, etc. 0 []  - Emergency Hospital Admission (emergent condition) 0 []  - Simple Discharge Coordination 0 []  - Complex (extensive) Discharge Coordination 0 PROCESS - Special Needs []  - Pediatric / Minor Patient Management 0 []  - Isolation Patient Management 0 []  - Hearing / Language / Visual special needs 0 []  - Assessment of Community assistance (transportation, D/C planning, etc.) 0 []  - Additional assistance / Altered mentation 0 []  - Support Surface(s) Assessment (bed, cushion, seat, etc.) 0 INTERVENTIONS - Wound Cleansing / Measurement X - Simple Wound Cleansing - one wound 1 5 []  -  Complex Wound Cleansing - multiple wounds 0 X - Wound Imaging (photographs - any number of wounds) 1 5 []  -  Wound Tracing (instead of photographs) 0 X - Simple Wound Measurement - one wound 1 5  - Complex Wound Measurement - multiple wounds 0 INTERVENTIONS - Wound Dressings X - Small Wound Dressing one or multiple wounds 1 10  - Medium Wound Dressing one or multiple wounds 0  - Large Wound Dressing one or multiple wounds 0  - Application of Medications - topical 0  - Application of Medications - injection 0 INTERVENTIONS - Miscellaneous  - External ear exam 0 [name], [name] ([id number])  - Specimen Collection (cultures, biopsies, blood, body fluids, etc.) 0  - Specimen(s) / Culture(s) sent or taken to Lab for analysis 0  - Patient Transfer (multiple staff / Michiel Sites Lift / Similar devices) 0  - Simple Staple / Suture removal (25 or less) 0  - Complex Staple / Suture removal (26 or more) 0  - Hypo / Hyperglycemic Management (close monitor of Blood Glucose) 0  - Ankle / Brachial Index (ABI) - do not check if billed separately 0 X - Vital Signs 1 5 Has the patient been seen at the hospital within the last three years: Yes Total Score: 70 Level Of Care: New/Established - Level 2 Electronic Signature(s) Signed: 10/12/2015 8:44:41 AM By: Elpidio Eric BSN, RN Entered By: Elpidio Eric on 10/12/2015 08:44:41 Medico, Markeith L. ([id number]) -------------------------------------------------------------------------------- Encounter Discharge Information Details Patient Name: Jim [name]. Date of Service: 10/12/2015 8:00 AM Medical Record Number: [id number] Patient Account Number: [id number] Date of Birth/Sex: 07-29-1930 (81 y.o. Male) Treating RN: Clover Mealy, RN, BSN, Rita Primary Care Physician: Darreld Mclean Other Clinician: Referring Physician: Darreld Mclean Treating Physician/Extender: Rudene Re in Treatment: 6 Encounter Discharge Information Items Discharge  Pain Level: 0 Discharge Condition: Stable Ambulatory Status: Wheelchair Discharge Destination: Home Private Transportation: Auto Accompanied By: self Schedule Follow-up Appointment: No Medication Reconciliation completed and No provided to Patient/Care Derrian Rodak: Clinical Summary of Care: Electronic Signature(s) Signed: 10/12/2015 8:53:30 AM By: Elpidio Eric BSN, RN Entered By: Elpidio Eric on 10/12/2015 08:53:30 Divelbiss, Joden L. ([id number]) -------------------------------------------------------------------------------- Lower Extremity Assessment Details Patient Name: Faxon, Jerson L. Date of Service: 10/12/2015 8:00 AM Medical Record Number: [id number] Patient Account Number: [id number] Date of Birth/Sex: 1930/07/18 (81 y.o. Male) Treating RN: Clover Mealy, RN, BSN, [place] Primary Care Physician: Darreld Mclean Other Clinician: Referring Physician: Darreld Mclean Treating Physician/Extender: Rudene Re in Treatment: 6 Vascular Assessment Pulses: Posterior Tibial Dorsalis Pedis Palpable: [Right:No] Doppler: [Right:Monophasic] Extremity colors, hair growth, and conditions: Extremity Color: [Right:Mottled] Hair Growth on Extremity: [Right:No] Temperature of Extremity: [Right:Warm] Capillary Refill: [Right:< 3 seconds] Toe Nail Assessment Left: Right: Thick: Yes Discolored: Yes Improper Length and Hygiene: Yes Electronic Signature(s) Signed: 10/12/2015 8:26:37 AM By: Elpidio Eric BSN, RN Entered By: Elpidio Eric on 10/12/2015 08:26:37 Danish, Wasyl L. ([id number]) -------------------------------------------------------------------------------- Multi Wound Chart Details Patient Name: [name], Waris L. Date of Service: 10/12/2015 8:00 AM Medical Record Number: [id number] Patient Account Number: [id number] Date of Birth/Sex: 11-06-1930 (81 y.o. Male) Treating RN: Clover Mealy, RN, BSN, [place]ink Primary Care Physician: Darreld Mclean Other Clinician: Referring Physician: Darreld Mclean Treating  Physician/Extender: Rudene Re in Treatment: 6 Vital Signs Height(in): 73 Pulse(bpm): 75 Weight(lbs): 222 Blood Pressure 162/81 (mmHg): Body Mass Index(BMI): 29 Temperature(F): 97.5 Respiratory Rate 18 (breaths/min): Photos: [2:No Photos] [N/A:N/A] Wound Location: [2:Right Foot - Plantar, Lateral] [N/A:N/A] Wounding Event: [2:Footwear Injury] [N/A:N/A] Primary Etiology: [2:Diabetic Wound/Ulcer of the Lower Extremity] [N/A:N/A] Comorbid History: [2:Cataracts, Hypertension, Type II Diabetes, Osteoarthritis] [N/A:N/A] Date Acquired: [2:08/08/2015] [N/A:N/A] Weeks of Treatment: [2:6] [N/A:N/A] Wound Status: [2:Open] [N/A:N/A] Measurements  L x W x D 0.6x0.9x0.1 [N/A:N/A] (cm) Area (cm) : [2:0.424] [N/A:N/A] Volume (cm) : [2:0.042] [N/A:N/A] % Reduction in Area: [2:55.00%] [N/A:N/A] % Reduction in Volume: 77.70% [N/A:N/A] Classification: [2:Grade 1] [N/A:N/A] Exudate Amount: [2:Small] [N/A:N/A] Exudate Type: [2:Serosanguineous] [N/A:N/A] Exudate Color: [2:red, brown] [N/A:N/A] Wound Margin: [2:Thickened] [N/A:N/A] Granulation Amount: [2:Medium (34-66%)] [N/A:N/A] Granulation Quality: [2:Pink, Pale] [N/A:N/A] Necrotic Amount: [2:Small (1-33%)] [N/A:N/A] Exposed Structures: [2:Fascia: No Fat: No Tendon: No Muscle: No] [N/A:N/A] Joint: No Bone: No Limited to Skin Breakdown Epithelialization: None N/A N/A Periwound Skin Texture: Callus: Yes N/A N/A Edema: No Excoriation: No Induration: No Crepitus: No Fluctuance: No Friable: No Rash: No Scarring: No Periwound Skin Moist: Yes N/A N/A Moisture: Maceration: No Dry/Scaly: No Periwound Skin Color: Atrophie Blanche: No N/A N/A Cyanosis: No Ecchymosis: No Erythema: No Hemosiderin Staining: No Mottled: No Pallor: No Rubor: No Temperature: No Abnormality N/A N/A Tenderness on No N/A N/A Palpation: Wound Preparation: Ulcer Cleansing: N/A N/A Rinsed/Irrigated with Saline Topical Anesthetic Applied:  Other: lidocaine 4% Treatment Notes Electronic Signature(s) Signed: 10/12/2015 8:43:35 AM By: Elpidio Eric BSN, RN Entered By: Elpidio Eric on 10/12/2015 08:43:35 Gatley, Jamarious L. ([id number]) -------------------------------------------------------------------------------- Multi-Disciplinary Care Plan Details Patient Name: [name], Maalik L. Date of Service: 10/12/2015 8:00 AM Medical Record Number: [id number] Patient Account Number: [id number] Date of Birth/Sex: 27-Feb-1930 (81 y.o. Male) Treating RN: Clover Mealy, RN, BSN, [place] Sink Primary Care Physician: Darreld Mclean Other Clinician: Referring Physician: Darreld Mclean Treating Physician/Extender: Rudene Re in Treatment: 6 Active Inactive Abuse / Safety / Falls / Self Care Management Nursing Diagnoses: Impaired home maintenance Impaired physical mobility Knowledge deficit related to: safety; personal, health (wound), emergency Potential for falls Self care deficit: actual or potential Goals: Patient will remain injury free Date Initiated: 08/30/2015 Goal Status: Active Patient/caregiver will verbalize understanding of skin care regimen Date Initiated: 08/30/2015 Goal Status: Active Patient/caregiver will verbalize understanding of the importance to maintain current immunizations/vaccinations Date Initiated: 08/30/2015 Goal Status: Active Patient/caregiver will verbalize/demonstrate measures taken to improve the patient's personal safety Date Initiated: 08/30/2015 Goal Status: Active Patient/caregiver will verbalize/demonstrate measures taken to prevent injury and/or falls Date Initiated: 08/30/2015 Goal Status: Active Patient/caregiver will verbalize/demonstrate understanding of what to do in case of emergency Date Initiated: 08/30/2015 Goal Status: Active Interventions: Provide education on basic hygiene Provide education on fall prevention Provide education on personal and home safety Provide education on safe transfers Schmit, Riker  L. ([id number]) Treatment Activities: Education provided on Basic Hygiene : 08/30/2015 Notes: Orientation to the Wound Care Program Nursing Diagnoses: Knowledge deficit related to the wound healing center program Goals: Patient/caregiver will verbalize understanding of the Wound Healing Center Program Date Initiated: 08/30/2015 Goal Status: Active Interventions: Provide education on orientation to the wound center Notes: Wound/Skin Impairment Nursing Diagnoses: Impaired tissue integrity Knowledge deficit related to ulceration/compromised skin integrity Goals: Patient/caregiver will verbalize understanding of skin care regimen Date Initiated: 08/30/2015 Goal Status: Active Ulcer/skin breakdown will have a volume reduction of 50% by week 8 Date Initiated: 08/30/2015 Goal Status: Active Ulcer/skin breakdown will heal within 14 weeks Date Initiated: 08/30/2015 Goal Status: Active Interventions: Assess patient/caregiver ability to obtain necessary supplies Assess patient/caregiver ability to perform ulcer/skin care regimen upon admission and as needed Assess ulceration(s) every visit Provide education on ulcer and skin care Treatment Activities: Skin care regimen initiated : 10/12/2015 Topical wound management initiated : 10/12/2015 Sanjurjo, Larkin L. ([id number]) Notes: Electronic Signature(s) Signed: 10/12/2015 8:43:27 AM By: Elpidio Eric BSN, RN Entered By: Elpidio Eric on 10/12/2015 08:43:27 Faulstich, Ander L. ([id number]) --------------------------------------------------------------------------------  Pain Assessment Details Patient Name: Ryland, Onnie L. Date of Service: 10/12/2015 8:00 AM Medical Record Number: [id number] Patient Account Number: [id number] Date of Birth/Sex: [birth year] (81 y.o. Male) Treating RN: Clover Mealy, RN, BSN, [place] Sink Primary Care Physician: Darreld Mclean Other Clinician: Referring Physician: Darreld Mclean Treating Physician/Extender: Rudene Re in Treatment:  6 Active Problems Location of Pain Severity and Description of Pain Patient Has Paino No Site Locations Pain Management and Medication Current Pain Management: Electronic Signature(s) Signed: 10/12/2015 8:26:48 AM By: Elpidio Eric BSN, RN Entered By: Elpidio Eric on 10/12/2015 08:26:48 Choyce, Mico L. ([id number]) -------------------------------------------------------------------------------- Patient/Caregiver Education Details Patient Name: Jim [name]. Date of Service: 10/12/2015 8:00 AM Medical Record Number: [id number] Patient Account Number: [id number] Date of Birth/Gender: 10-08-1930 (81 y.o. Male) Treating RN: Clover Mealy, RN, BSN, [place] Sink Primary Care Physician: Darreld Mclean Other Clinician: Referring Physician: Darreld Mclean Treating Physician/Extender: Rudene Re in Treatment: 6 Education Assessment Education Provided To: Patient Education Topics Provided Basic Hygiene: Methods: Explain/Verbal Responses: State content correctly Electronic Signature(s) Signed: 10/12/2015 8:53:38 AM By: Elpidio Eric BSN, RN Entered By: Elpidio Eric on 10/12/2015 08:53:38 Mcclintic, Jyles L. ([id number]) -------------------------------------------------------------------------------- Wound Assessment Details Patient Name: [name], Benzion L. Date of Service: 10/12/2015 8:00 AM Medical Record Number: [id number] Patient Account Number: [id number] Date of Birth/Sex: [birth year] (81 y.o. Male) Treating RN: Clover Mealy, RN, BSN, Rita Primary Care Physician: Darreld Mclean Other Clinician: Referring Physician: Darreld Mclean Treating Physician/Extender: Rudene Re in Treatment: 6 Wound Status Wound Number: 2 Primary Diabetic Wound/Ulcer of the Lower Etiology: Extremity Wound Location: Right Foot - Plantar, Lateral Wound Open Wounding Event: Footwear Injury Status: Date Acquired: 08/08/2015 Comorbid Cataracts, Hypertension, Type II Weeks Of Treatment: 6 History: Diabetes,  Osteoarthritis Clustered Wound: No Photos Photo Uploaded By: Elpidio Eric on 10/12/2015 11:21:22 Wound Measurements Length: (cm) 0.6 Width: (cm) 0.9 Depth: (cm) 0.1 Area: (cm) 0.424 Volume: (cm) 0.042 % Reduction in Area: 55% % Reduction in Volume: 77.7% Epithelialization: None Tunneling: No Undermining: No Wound Description Classification: Grade 1 Foul Odor Aft Wound Margin: Thickened Exudate Amount: Small Exudate Type: Serosanguineous Exudate Color: red, brown er Cleansing: No Wound Bed Granulation Amount: Medium (34-66%) Exposed Structure Granulation Quality: Pink, Pale Fascia Exposed: No Necrotic Amount: Small (1-33%) Fat Layer Exposed: No Necrotic Quality: Adherent Slough Tendon Exposed: No Violette, Burlie L. ([id number]) Muscle Exposed: No Joint Exposed: No Bone Exposed: No Limited to Skin Breakdown Periwound Skin Texture Texture Color No Abnormalities Noted: No No Abnormalities Noted: No Callus: Yes Atrophie Blanche: No Crepitus: No Cyanosis: No Excoriation: No Ecchymosis: No Fluctuance: No Erythema: No Friable: No Hemosiderin Staining: No Induration: No Mottled: No Localized Edema: No Pallor: No Rash: No Rubor: No Scarring: No Temperature / Pain Moisture Temperature: No Abnormality No Abnormalities Noted: No Dry / Scaly: No Maceration: No Moist: Yes Wound Preparation Ulcer Cleansing: Rinsed/Irrigated with Saline Topical Anesthetic Applied: Other: lidocaine 4%, Treatment Notes Wound #2 (Right, Lateral, Plantar Foot) 1. Cleansed with: Clean wound with Normal Saline 4. Dressing Applied: Prisma Ag 5. Secondary Dressing Applied Gauze and Kerlix/Conform 6. Footwear/Offloading device applied Surgical shoe 7. Secured with Tape Notes felt surrounding wound for off-loading, darco with peg assist Electronic Signature(s) Signed: 10/12/2015 8:43:17 AM By: Elpidio Eric BSN, RN Entered By: Elpidio Eric on 10/12/2015 08:43:17 Jantz, Hassel L.  ([id number]) Timbrook, Tyrus L. ([id number]) -------------------------------------------------------------------------------- Vitals Details Patient Name: [name], Smiley L. Date of Service: 10/12/2015 8:00 AM Medical Record Number: [id number] Patient Account Number: [id number] Date of Birth/Sex: Aug 11, 1930 (81 y.o. Male) Treating RN: Clover Mealy, RN, BSN, [employer]  Primary Care Physician: MILES, LINDA Other Clinician: Referring Physician: Darreld Mclean Treating Physician/Extender: Rudene Re in Treatment: 6 Vital Signs Time Taken: 08:30 Temperature (F): 97.5 Height (in): 73 Pulse (bpm): 75 Weight (lbs): 222 Respiratory Rate (breaths/min): 18 Body Mass Index (BMI): 29.3 Blood Pressure (mmHg): 162/81 Reference Range: 80 - 120 mg / dl Electronic Signature(s) Signed: 10/12/2015 8:31:26 AM By: Elpidio Eric BSN, RN Entered By: Elpidio Eric on 10/12/2015 08:31:26

## 2015-10-26 ENCOUNTER — Ambulatory Visit: Payer: Medicare HMO | Admitting: Surgery

## 2015-11-02 ENCOUNTER — Encounter: Payer: Medicare HMO | Attending: Surgery | Admitting: Surgery

## 2015-11-02 DIAGNOSIS — I7025 Atherosclerosis of native arteries of other extremities with ulceration: Secondary | ICD-10-CM | POA: Diagnosis not present

## 2015-11-02 DIAGNOSIS — Z794 Long term (current) use of insulin: Secondary | ICD-10-CM | POA: Diagnosis not present

## 2015-11-02 DIAGNOSIS — I10 Essential (primary) hypertension: Secondary | ICD-10-CM | POA: Diagnosis not present

## 2015-11-02 DIAGNOSIS — Z89512 Acquired absence of left leg below knee: Secondary | ICD-10-CM | POA: Insufficient documentation

## 2015-11-02 DIAGNOSIS — E11621 Type 2 diabetes mellitus with foot ulcer: Secondary | ICD-10-CM | POA: Diagnosis not present

## 2015-11-02 DIAGNOSIS — L97512 Non-pressure chronic ulcer of other part of right foot with fat layer exposed: Secondary | ICD-10-CM | POA: Diagnosis not present

## 2015-11-02 NOTE — Progress Notes (Addendum)
Jim [name]. ([id number]) Visit Report for 11/02/2015 Chief Complaint Document Details Patient Name: Jim [name], Jim [name]. Date of Service: 11/02/2015 9:30 AM Medical Record Number: [id number] Patient Account Number: [id number] Date of Birth/Sex: 09-15-1930 (81 y.o. Male) Treating RN: Curtis Sites Primary Care Physician: Darreld Mclean Other Clinician: Referring Physician: Darreld Mclean Treating Physician/Extender: Rudene Re in Treatment: 9 Information Obtained from: Patient Chief Complaint Patients presents for treatment of an open diabetic ulcer. The patient noticed some blood on his sock and noticed that he had a ulcerated area on his right forefoot and his right fourth toe. This was about 3 weeks ago. Electronic Signature(s) Signed: 11/02/2015 10:06:43 AM By: Evlyn Kanner MD, FACS Entered By: Evlyn Kanner on 11/02/2015 10:06:43 Jim [name]. ([id number]) -------------------------------------------------------------------------------- HPI Details Patient Name: Jim [name]. Date of Service: 11/02/2015 9:30 AM Medical Record Number: [id number] Patient Account Number: [id number] Date of Birth/Sex: 1930/08/15 (81 y.o. Male) Treating RN: Curtis Sites Primary Care Physician: Darreld Mclean Other Clinician: Referring Physician: Darreld Mclean Treating Physician/Extender: Rudene Re in Treatment: 9 History of Present Illness Location: ulcerated area on the right foot plantar aspect and his right fourth toe Quality: Patient reports No Pain. Severity: Patient states wound are getting worse. Duration: Patient has had the wound for < 3 weeks prior to presenting for treatment Context: The wound appeared gradually over time Modifying Factors: Other treatment(s) tried include:the patient has been put on oral Levaquin for 10 days Associated Signs and Symptoms: Patient reports having increase swelling. HPI Description: The patient is a pleasant 81 year old with a past medical  history significant for type 2 diabetes and peripheral vascular disease. He underwent a left below-the-knee amputation approximately 10 years ago after stepping on a nail. He has done well since that time and is able to ambulate with a prosthetic. He has had a vascular procedure on his right leg about 4-5 years ago and has never seen the doctor again. His diabetes is under control and his last hemoglobin A1c was 7.7% measured in January 2016. His other comorbidities are hypertension, cellulitis of the foot, recent urinary tract infection. He was seen earlier for a wound problem about a year ago on his BKA stump. He has been referred back to the wound center by his PCP Dr. Darreld Mclean for a moderately severe cellulitis of the right foot and right fourth toenail in a gentleman who is known to have PVD and is status post left BKA and has insulin-dependent diabetes mellitus. 09/07/2015 -- the patient has not yet got his appointment to see the vascular doctors and I have urged him to keep this. He's also been having some problems with home health coming to do his dressings and we will work with his agency to synchronize this. 09/14/2015 -- he has an appointment to see the vascular surgeons on Monday. 09/21/2015-- due to transport issues he was unable to keep his vascular appointment and is rescheduled for next week. 09/28/2015 -- he has had his vascular workup but we did not have the report yet. He did understand that he needs an arterial procedure and this has to be scheduled sometime soon. We will await the vascular report from the office. 10/12/2015 -- the patient had his arterial duplex done recently and this showed right tibial artery occlusive disease without evidence of any blockage of the right femoral and popliteal artery stent. He was asked to have a right lower extremity angiogram with possible intervention and the patient is aware of the plan. However he  has several social and economic  problems to get to the hospital and is trying to fix his appointment as soon as possible. 11/02/2015 -- the patient has various social issues and he has not yet scheduled his surgery for his right lower extremity. He is still reluctant to fix this. Electronic Signature(s) Jim [name]. ([id number]) Signed: 11/02/2015 10:07:17 AM By: Evlyn KannerBritto, Maloree Uplinger MD, FACS Entered By: Evlyn KannerBritto, Jeferson Boozer on 11/02/2015 10:07:17 Jim [name]. ([id number]) -------------------------------------------------------------------------------- Physical Exam Details Patient Name: Jim [name]. Date of Service: 11/02/2015 9:30 AM Medical Record Number: [id number] Patient Account Number: [id number] Date of Birth/Sex: 1930/02/28 62(81 y.o. Male) Treating RN: Curtis Sitesorthy, Joanna Primary Care Physician: Darreld McleanMILES, LINDA Other Clinician: Referring Physician: Darreld McleanMILES, LINDA Treating Physician/Extender: Rudene ReBritto, Vadis Slabach Weeks in Treatment: 9 Constitutional . Pulse regular. Respirations normal and unlabored. Afebrile. . Eyes Nonicteric. Reactive to light. Ears, Nose, Mouth, and Throat Lips, teeth, and gums WNL.Marland Kitchen. Moist mucosa without lesions . Neck supple and nontender. No palpable supraclavicular or cervical adenopathy. Normal sized without goiter. Respiratory WNL. No retractions.. Cardiovascular Pedal Pulses WNL. No clubbing, cyanosis or edema. Lymphatic No adneopathy. No adenopathy. No adenopathy. Musculoskeletal Adexa without tenderness or enlargement.. Digits and nails w/o clubbing, cyanosis, infection, petechiae, ischemia, or inflammatory conditions.. Integumentary (Hair, Skin) No suspicious lesions. No crepitus or fluctuance. No peri-wound warmth or erythema. No masses.Marland Kitchen. Psychiatric Judgement and insight Intact.. No evidence of depression, anxiety, or agitation.. Notes the patient has completely healed his wound and there is no surrounding callus to remove. Electronic Signature(s) Signed: 11/02/2015 10:07:48 AM By: Evlyn KannerBritto,  Riverlyn Kizziah MD, FACS Entered By: Evlyn KannerBritto, Kimberly Coye on 11/02/2015 10:07:48 Jim [name], Jim LMarland Kitchen. ([id number]) -------------------------------------------------------------------------------- Physician Orders Details Patient Name: Jim PilgrimOBLE, Tavares [name]. Date of Service: 11/02/2015 9:30 AM Medical Record Number: [id number] Patient Account Number: [id number] Date of Birth/Sex: 1930/02/28 37(81 y.o. 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 ([id number]) -------------------------------------------------------------------------------- Problem List Details Patient Name: Jim NevinOBLE, Haydn [name]. Date of Service: 11/02/2015 9:30 AM Medical Record Number: [id number] Patient Account Number: [id number] Date of Birth/Sex: 1930/02/28 49(81 y.o. Male) Treating RN: Curtis Sitesorthy, Joanna Primary Care Physician: Darreld McleanMILES, LINDA Other Clinician: Referring Physician: Darreld McleanMILES, LINDA Treating Physician/Extender: Rudene ReBritto, Issac Moure Weeks in Treatment: 9 Active Problems ICD-10 Encounter Code Description Active Date Diagnosis E11.621 Type 2 diabetes mellitus with foot ulcer 08/30/2015 Yes Z89.512 Acquired absence of left leg below knee 08/30/2015 Yes I70.25 Atherosclerosis  of native arteries of other extremities with 08/30/2015 Yes ulceration L97.512 Non-pressure chronic ulcer of other part of right foot with 08/30/2015 Yes fat layer exposed Inactive Problems Resolved Problems Electronic Signature(s) Signed: 11/02/2015 10:06:36 AM By: Evlyn KannerBritto, Linford Quintela MD, FACS Entered By: Evlyn KannerBritto, Ayari Liwanag on 11/02/2015 10:06:36 Brittle, Davionne [name]. ([id number]) -------------------------------------------------------------------------------- Progress Note Details Patient Name: Jim PilgrimOBLE, Zeb [name]. Date of Service: 11/02/2015 9:30 AM Medical Record Number: [id number] Patient Account Number: [id number] Date of Birth/Sex: 1930/02/28 24(81 y.o. Male) Treating RN:  Curtis Sites Primary Care Physician: Darreld Mclean Other Clinician: Referring Physician: Darreld Mclean Treating Physician/Extender: Rudene Re in Treatment: 9 Subjective Chief Complaint Information obtained from Patient Patients presents for treatment of an open diabetic ulcer. The patient noticed some blood on his sock and noticed that he had a ulcerated area on his right forefoot and his right fourth toe. This was about 3 weeks ago. History of Present Illness (HPI) The following HPI elements were documented for the patient's wound: Location: ulcerated area on the right foot plantar aspect and his right fourth toe Quality: Patient reports No Pain. Severity: Patient states wound are getting worse. Duration: Patient has had the wound for < 3 weeks prior to presenting for treatment Context: The wound appeared gradually over time Modifying Factors: Other treatment(s) tried include:the patient has been put on oral Levaquin for 10 days Associated Signs and Symptoms: Patient reports having increase swelling. The patient is a pleasant 81 year old with a past medical history significant for type 2 diabetes and peripheral vascular disease. He underwent a left below-the-knee amputation approximately 10 years ago after stepping on a  nail. He has done well since that time and is able to ambulate with a prosthetic. He has had a vascular procedure on his right leg about 4-5 years ago and has never seen the doctor again. His diabetes is under control and his last hemoglobin A1c was 7.7% measured in January 2016. His other comorbidities are hypertension, cellulitis of the foot, recent urinary tract infection. He was seen earlier for a wound problem about a year ago on his BKA stump. He has been referred back to the wound center by his PCP Dr. Darreld Mclean for a moderately severe cellulitis of the right foot and right fourth toenail in a gentleman who is known to have PVD and is status post left BKA and has insulin-dependent diabetes mellitus. 09/07/2015 -- the patient has not yet got his appointment to see the vascular doctors and I have urged him to keep this. He's also been having some problems with home health coming to do his dressings and we will work with his agency to synchronize this. 09/14/2015 -- he has an appointment to see the vascular surgeons on Monday. 09/21/2015-- due to transport issues he was unable to keep his vascular appointment and is rescheduled for next week. 09/28/2015 -- he has had his vascular workup but we did not have the report yet. He did understand that he needs an arterial procedure and this has to be scheduled sometime soon. We will await the vascular report from the office. 10/12/2015 -- the patient had his arterial duplex done recently and this showed right tibial artery occlusive Jim [name], Jim [name]. ([id number]) disease without evidence of any blockage of the right femoral and popliteal artery stent. He was asked to have a right lower extremity angiogram with possible intervention and the patient is aware of the plan. However he has several social and economic problems to get to the hospital and is trying to fix his appointment as soon as possible. 11/02/2015 -- the patient has various social  issues and he has not yet scheduled his surgery for his right lower extremity. He is still reluctant to fix this. Objective Constitutional Pulse regular. Respirations normal and unlabored. Afebrile. Vitals Time Taken: 9:44 AM, Height: 73 in, Weight: 222 lbs, BMI: 29.3, Temperature: 98.1 F, Pulse: 75 bpm, Respiratory Rate: 18 breaths/min, Blood Pressure: 138/59 mmHg. Eyes Nonicteric. Reactive to light. Ears, Nose, Mouth, and Throat Lips, teeth, and  gums WNL.Marland Kitchen Moist mucosa without lesions . Neck supple and nontender. No palpable supraclavicular or cervical adenopathy. Normal sized without goiter. Respiratory WNL. No retractions.. Cardiovascular Pedal Pulses WNL. No clubbing, cyanosis or edema. Lymphatic No adneopathy. No adenopathy. No adenopathy. Musculoskeletal Adexa without tenderness or enlargement.. Digits and nails w/o clubbing, cyanosis, infection, petechiae, ischemia, or inflammatory conditions.Marland Kitchen Psychiatric Judgement and insight Intact.. No evidence of depression, anxiety, or agitation.. General Notes: the patient has completely healed his wound and there is no surrounding callus to remove. Integumentary (Hair, Skin) Jim [name], Jim [name]. ([id number]) No suspicious lesions. No crepitus or fluctuance. No peri-wound warmth or erythema. No masses.. Wound #2 status is Open. Original cause of wound was Footwear Injury. The wound is located on the Right,Lateral,Plantar Foot. The wound measures 0cm length x 0cm width x 0cm depth; 0cm^2 area and 0cm^3 volume. Assessment Active Problems ICD-10 E11.621 - Type 2 diabetes mellitus with foot ulcer Z89.512 - Acquired absence of left leg below knee I70.25 - Atherosclerosis of native arteries of other extremities with ulceration L97.512 - Non-pressure chronic ulcer of other part of right foot with fat layer exposed Having healed his wound with conservative therapy and local care I have now recommended very strongly that he should: 1. visit  his vascular surgeon and reschedule his angioplasty and intervention 2. Talk to his PCP to get a prescription for his orthotic diabetes shoes. 3. Continue with foot hygiene and good care of his blood sugar. He is discharged on the wound care services and will be seen back as needed. Plan Discharge From [facility] Services: Discharge from Wound Care Center Having healed his wound with conservative therapy and local care I have now recommended very strongly that he should: 1. visit his vascular surgeon and reschedule his angioplasty and intervention 2. Talk to his PCP to get a prescription for his orthotic diabetes shoes. Jim [name], Jim [name]. ([id number]) 3. Continue with foot hygiene and good care of his blood sugar. He is discharged on the wound care services and will be seen back as needed. Electronic Signature(s) Signed: 11/02/2015 3:48:08 PM By: Evlyn Kanner MD, FACS Previous Signature: 11/02/2015 10:09:22 AM Version By: Evlyn Kanner MD, FACS Entered By: Evlyn Kanner on 11/02/2015 15:48:08 Jim [name], Jim [name]. ([id number]) -------------------------------------------------------------------------------- SuperBill Details Patient Name: Jim [name], Huxley [name]. Date of Service: 11/02/2015 Medical Record Number: [id number] Patient Account Number: [id number] Date of Birth/Sex: March 27, 1930 (81 y.o. 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 L97.512 Non-pressure chronic ulcer of other part of right foot with fat layer exposed Facility Procedures CPT4 Code: [id number] Description: 32440 - WOUND CARE VISIT-LEV 2 EST PT Modifier: Quantity: 1 Physician  Procedures CPT4 Code Description: [id number] - WC PHYS LEVEL 3 - EST PT ICD-10 Description Diagnosis E11.621 Type 2 diabetes mellitus with foot ulcer Z89.512 Acquired absence of left leg below knee I70.25 Atherosclerosis of native arteries of other extremit  L97.512 Non-pressure chronic ulcer of other part of right fo Modifier: ies with ulcera ot with fat lay Quantity: 1 tion er exposed Electronic Signature(s) Signed: 11/02/2015 12:07:32 PM By: Curtis Sites Signed: 11/02/2015 3:45:45 PM By: Evlyn Kanner MD, FACS Previous Signature: 11/02/2015 10:09:38 AM Version By: Evlyn Kanner MD, FACS Entered By: Curtis Sites on 11/02/2015 12:07:31

## 2015-11-03 NOTE — Progress Notes (Signed)
Sinyard, Adan L. ([id number]) Visit Report for 11/02/2015 Arrival Information Details Patient Name: Jim [name], [name] Date of Service: 11/02/2015 9:30 AM Medical Record Number: [id number] Patient Account Number: [id number] Date of Birth/Sex: June 02, 1930 (81 y.o. Male) Treating RN: Curtis Sites Primary Care Physician: Darreld Mclean Other Clinician: Referring Physician: Darreld Mclean Treating Physician/Extender: Rudene Re in Treatment: 9 Visit Information History Since Last Visit Added or deleted any medications: No Patient Arrived: Wheel Chair Any new allergies or adverse reactions: No Arrival Time: 09:43 Had a fall or experienced change in No activities of daily living that may affect Accompanied By: self risk of falls: Transfer Assistance: None Signs or symptoms of abuse/neglect since last No Patient Identification Verified: Yes visito Secondary Verification Process Yes Hospitalized since last visit: No Completed: Pain Present Now: No Patient Requires Transmission-Based No Precautions: Patient Has Alerts: No Electronic Signature(s) Signed: 11/02/2015 4:46:28 PM By: Curtis Sites Entered By: Curtis Sites on 11/02/2015 09:44:22 Jim [name], [name] ([id number]) -------------------------------------------------------------------------------- Clinic Level of Care Assessment Details Patient Name: Jim [name], [name] Date of Service: 11/02/2015 9:30 AM Medical Record Number: [id number] Patient Account Number: [id number] Date of Birth/Sex: 1930-07-25 (81 y.o. Male) Treating RN: Curtis Sites Primary Care Physician: Darreld Mclean Other Clinician: Referring Physician: Darreld Mclean Treating Physician/Extender: Rudene Re in Treatment: 9 Clinic Level of Care Assessment Items TOOL 4 Quantity Score  - Use when only an EandM is performed on FOLLOW-UP visit 0 ASSESSMENTS - Nursing Assessment / Reassessment X - Reassessment of Co-morbidities (includes updates in patient  status) 1 10 X - Reassessment of Adherence to Treatment Plan 1 5 ASSESSMENTS - Wound and Skin Assessment / Reassessment X - Simple Wound Assessment / Reassessment - one wound 1 5  - Complex Wound Assessment / Reassessment - multiple wounds 0  - Dermatologic / Skin Assessment (not related to wound area) 0 ASSESSMENTS - Focused Assessment  - Circumferential Edema Measurements - multi extremities 0  - Nutritional Assessment / Counseling / Intervention 0 X - Lower Extremity Assessment (monofilament, tuning fork, pulses) 1 5  - Peripheral Arterial Disease Assessment (using hand held doppler) 0 ASSESSMENTS - Ostomy and/or Continence Assessment and Care  - Incontinence Assessment and Management 0  - Ostomy Care Assessment and Management (repouching, etc.) 0 PROCESS - Coordination of Care X - Simple Patient / Family Education for ongoing care 1 15  - Complex (extensive) Patient / Family Education for ongoing care 0  - Staff obtains Chiropractor, Records, Test Results / Process Orders 0  - Staff telephones HHA, Nursing Homes / Clarify orders / etc 0  - Routine Transfer to another Facility (non-emergent condition) 0 Varady, Chiron L. ([id number])  - Routine Hospital Admission (non-emergent condition) 0  - New Admissions / Manufacturing engineer / Ordering NPWT, Apligraf, etc. 0  - Emergency Hospital Admission (emergent condition) 0 X - Simple Discharge Coordination 1 10  - Complex (extensive) Discharge Coordination 0 PROCESS - Special Needs  - Pediatric / Minor Patient Management 0  - Isolation Patient Management 0  - Hearing / Language / Visual special needs 0  - Assessment of Community assistance (transportation, D/C planning, etc.) 0  - Additional assistance / Altered mentation 0  - Support Surface(s) Assessment (bed, cushion, seat, etc.) 0 INTERVENTIONS - Wound Cleansing / Measurement X - Simple Wound Cleansing - one wound 1 5  - Complex Wound  Cleansing - multiple wounds 0 X - Wound Imaging (photographs - any number of wounds) 1 5  - Wound Tracing (instead of photographs) 0  X - Simple Wound Measurement - one wound 1 5  - Complex Wound Measurement - multiple wounds 0 INTERVENTIONS - Wound Dressings  - Small Wound Dressing one or multiple wounds 0  - Medium Wound Dressing one or multiple wounds 0  - Large Wound Dressing one or multiple wounds 0  - Application of Medications - topical 0  - Application of Medications - injection 0 INTERVENTIONS - Miscellaneous  - External ear exam 0 Jim [name], [name] ([id number])  - Specimen Collection (cultures, biopsies, blood, body fluids, etc.) 0  - Specimen(s) / Culture(s) sent or taken to Lab for analysis 0  - Patient Transfer (multiple staff / Michiel Sites Lift / Similar devices) 0  - Simple Staple / Suture removal (25 or less) 0  - Complex Staple / Suture removal (26 or more) 0  - Hypo / Hyperglycemic Management (close monitor of Blood Glucose) 0  - Ankle / Brachial Index (ABI) - do not check if billed separately 0 X - Vital Signs 1 5 Has the patient been seen at the hospital within the last three years: Yes Total Score: 70 Level Of Care: New/Established - Level 2 Electronic Signature(s) Signed: 11/02/2015 12:07:20 PM By: Curtis Sites Entered By: Curtis Sites on 11/02/2015 12:07:19 Jim [name], [name] ([id number]) -------------------------------------------------------------------------------- Encounter Discharge Information Details Patient Name: Jim [name], [name] Date of Service: 11/02/2015 9:30 AM Medical Record Number: [id number] Patient Account Number: [id number] Date of Birth/Sex: [birth year] (81 y.o. Male) Treating RN: Curtis Sites Primary Care Physician: Darreld Mclean Other Clinician: Referring Physician: Darreld Mclean Treating Physician/Extender: Rudene Re in Treatment: 9 Encounter Discharge Information Items Discharge Pain Level: 0 Discharge  Condition: Stable Ambulatory Status: Wheelchair Discharge Destination: Home Transportation: Private Auto Accompanied By: self Schedule Follow-up Appointment: Yes Medication Reconciliation completed and provided to Patient/Care No Bayan Hedstrom: Provided on Clinical Summary of Care: 11/02/2015 Form Type Recipient Paper Patient [facility] Electronic Signature(s) Signed: 11/02/2015 12:07:50 PM By: Curtis Sites Previous Signature: 11/02/2015 10:07:36 AM Version By: Gwenlyn Perking Entered By: Curtis Sites on 11/02/2015 12:07:49 Jim [name], [name] ([id number]) -------------------------------------------------------------------------------- Lower Extremity Assessment Details Patient Name: Jim [name], [name] Date of Service: 11/02/2015 9:30 AM Medical Record Number: [id number] Patient Account Number: [id number] Date of Birth/Sex: 1930/05/16 (81 y.o. Male) Treating RN: Curtis Sites Primary Care Physician: Darreld Mclean Other Clinician: Referring Physician: Darreld Mclean Treating Physician/Extender: Rudene Re in Treatment: 9 Vascular Assessment Pulses: Posterior Tibial Dorsalis Pedis Palpable: [Right:No] Doppler: [Right:Monophasic] Extremity colors, hair growth, and conditions: Extremity Color: [Right:Mottled] Hair Growth on Extremity: [Right:No] Temperature of Extremity: [Right:Cool] Capillary Refill: [Right:< 3 seconds] Toe Nail Assessment Left: Right: Thick: Yes Discolored: Yes Deformed: Yes Improper Length and Hygiene: No Electronic Signature(s) Signed: 11/02/2015 4:46:28 PM By: Curtis Sites Entered By: Curtis Sites on 11/02/2015 09:53:55 Jim [name], [name] ([id number]) -------------------------------------------------------------------------------- Multi-Disciplinary Care Plan Details Patient Name: Jim [name], [name] Date of Service: 11/02/2015 9:30 AM Medical Record Number: [id number] Patient Account Number: [id number] Date of Birth/Sex: 13-Mar-1930 (81 y.o. Male) Treating RN:  Curtis Sites Primary Care Physician: Darreld Mclean Other Clinician: Referring Physician: Darreld Mclean Treating Physician/Extender: Rudene Re in Treatment: 9 Active Inactive Electronic Signature(s) Signed: 11/02/2015 12:06:37 PM By: Curtis Sites Entered By: Curtis Sites on 11/02/2015 12:06:37 Kinoshita, Kell L. ([id number]) -------------------------------------------------------------------------------- Patient/Caregiver Education Details Patient Name: Jim [name] Date of Service: 11/02/2015 9:30 AM Medical Record Number: [id number] Patient Account Number: [id number] Date of Birth/Gender: 07-17-1930 (81 y.o. Male) Treating RN: Curtis Sites Primary Care Physician: Darreld Mclean Other Clinician: Referring Physician: Darreld Mclean Treating Physician/Extender: Meyer Russel,  Cecilie KicksErrol Weeks in Treatment: 9 Education Assessment Education Provided To: Patient Education Topics Provided Basic Hygiene: Handouts: Other: lotion to foot daily Methods: Explain/Verbal Responses: State content correctly Electronic Signature(s) Signed: 11/02/2015 12:08:06 PM By: Curtis Sitesorthy, Joanna Entered By: Curtis Sitesorthy, Joanna on 11/02/2015 12:08:06 Coates, Keilon L. ([id number]) -------------------------------------------------------------------------------- Wound Assessment Details Patient Name: Jim NevinOBLE, [name] Date of Service: 11/02/2015 9:30 AM Medical Record Number: [id number] Patient Account Number: [id number] Date of Birth/Sex: 1930-09-28 34(81 y.o. Male) Treating RN: Curtis Sitesorthy, Joanna Primary Care Physician: Darreld McleanMILES, LINDA Other Clinician: Referring Physician: Darreld McleanMILES, LINDA Treating Physician/Extender: Rudene ReBritto, Errol Weeks in Treatment: 9 Wound Status Wound Number: 2 Primary Diabetic Wound/Ulcer of the Lower Etiology: Extremity Wound Location: Right, Lateral, Plantar Foot Wound Status: Open Wounding Event: Footwear Injury Date Acquired: 08/08/2015 Weeks Of Treatment: 9 Clustered Wound: No Photos Photo  Uploaded By: Curtis Sitesorthy, Joanna on 11/02/2015 12:14:35 Wound Measurements Length: (cm) 0 % Reduction i Width: (cm) 0 % Reduction i Depth: (cm) 0 Area: (cm) 0 Volume: (cm) 0 n Area: 100% n Volume: 100% Wound Description Classification: Grade 1 Periwound Skin Texture Texture Color No Abnormalities Noted: No No Abnormalities Noted: No Moisture No Abnormalities Noted: No Electronic Signature(s) Signed: 11/02/2015 4:46:28 PM By: Sande Brothersorthy, Joanna Pollet, Keary L. ([id number]) Entered By: Curtis Sitesorthy, Joanna on 11/02/2015 09:54:01 Yarde, Calbert L. ([id number]) -------------------------------------------------------------------------------- Vitals Details Patient Name: Jim PilgrimOBLE, Kiano L. Date of Service: 11/02/2015 9:30 AM Medical Record Number: [id number] Patient Account Number: [id number] Date of Birth/Sex: 1930-09-28 21(81 y.o. Male) Treating RN: Curtis Sitesorthy, Joanna Primary Care Physician: Darreld McleanMILES, LINDA Other Clinician: Referring Physician: Darreld McleanMILES, LINDA Treating Physician/Extender: Rudene ReBritto, Errol Weeks in Treatment: 9 Vital Signs Time Taken: 09:44 Temperature (F): 98.1 Height (in): 73 Pulse (bpm): 75 Weight (lbs): 222 Respiratory Rate (breaths/min): 18 Body Mass Index (BMI): 29.3 Blood Pressure (mmHg): 138/59 Reference Range: 80 - 120 mg / dl Electronic Signature(s) Signed: 11/02/2015 4:46:28 PM By: Curtis Sitesorthy, Joanna Entered By: Curtis Sitesorthy, Joanna on 11/02/2015 09:46:24

## 2016-09-16 ENCOUNTER — Inpatient Hospital Stay
Admission: EM | Admit: 2016-09-16 | Discharge: 2016-09-23 | DRG: 853 | Disposition: A | Discharge status: Home Health | Payer: Medicare HMO | Attending: Family Medicine | Admitting: Family Medicine

## 2016-09-16 ENCOUNTER — Emergency Department: Payer: Medicare HMO

## 2016-09-16 ENCOUNTER — Encounter: Payer: Self-pay | Admitting: Emergency Medicine

## 2016-09-16 DIAGNOSIS — Z87891 Personal history of nicotine dependence: Secondary | ICD-10-CM

## 2016-09-16 DIAGNOSIS — R296 Repeated falls: Secondary | ICD-10-CM | POA: Diagnosis present

## 2016-09-16 DIAGNOSIS — Z880 Allergy status to penicillin: Secondary | ICD-10-CM | POA: Diagnosis not present

## 2016-09-16 DIAGNOSIS — I10 Essential (primary) hypertension: Secondary | ICD-10-CM | POA: Diagnosis present

## 2016-09-16 DIAGNOSIS — A419 Sepsis, unspecified organism: Secondary | ICD-10-CM | POA: Diagnosis present

## 2016-09-16 DIAGNOSIS — E1059 Type 1 diabetes mellitus with other circulatory complications: Secondary | ICD-10-CM

## 2016-09-16 DIAGNOSIS — Z79891 Long term (current) use of opiate analgesic: Secondary | ICD-10-CM

## 2016-09-16 DIAGNOSIS — E1140 Type 2 diabetes mellitus with diabetic neuropathy, unspecified: Secondary | ICD-10-CM | POA: Diagnosis present

## 2016-09-16 DIAGNOSIS — E11649 Type 2 diabetes mellitus with hypoglycemia without coma: Secondary | ICD-10-CM | POA: Diagnosis not present

## 2016-09-16 DIAGNOSIS — R0902 Hypoxemia: Secondary | ICD-10-CM | POA: Diagnosis present

## 2016-09-16 DIAGNOSIS — R0602 Shortness of breath: Secondary | ICD-10-CM

## 2016-09-16 DIAGNOSIS — Z993 Dependence on wheelchair: Secondary | ICD-10-CM | POA: Diagnosis not present

## 2016-09-16 DIAGNOSIS — J189 Pneumonia, unspecified organism: Secondary | ICD-10-CM

## 2016-09-16 DIAGNOSIS — Z89512 Acquired absence of left leg below knee: Secondary | ICD-10-CM | POA: Diagnosis not present

## 2016-09-16 DIAGNOSIS — W19XXXA Unspecified fall, initial encounter: Secondary | ICD-10-CM | POA: Diagnosis present

## 2016-09-16 DIAGNOSIS — K5909 Other constipation: Secondary | ICD-10-CM | POA: Diagnosis present

## 2016-09-16 DIAGNOSIS — L89894 Pressure ulcer of other site, stage 4: Secondary | ICD-10-CM | POA: Diagnosis present

## 2016-09-16 DIAGNOSIS — Z9114 Patient's other noncompliance with medication regimen: Secondary | ICD-10-CM | POA: Diagnosis not present

## 2016-09-16 DIAGNOSIS — E1151 Type 2 diabetes mellitus with diabetic peripheral angiopathy without gangrene: Secondary | ICD-10-CM | POA: Diagnosis present

## 2016-09-16 DIAGNOSIS — E11621 Type 2 diabetes mellitus with foot ulcer: Secondary | ICD-10-CM | POA: Diagnosis present

## 2016-09-16 DIAGNOSIS — L8990 Pressure ulcer of unspecified site, unspecified stage: Secondary | ICD-10-CM | POA: Insufficient documentation

## 2016-09-16 HISTORY — DX: Type 2 diabetes mellitus without complications: E11.9

## 2016-09-16 LAB — CBC WITH DIFFERENTIAL/PLATELET
BLASTS: 0 %
Band Neutrophils: 4 %
Basophils Absolute: 0 10*3/uL (ref 0–0.1)
Basophils Relative: 0 %
EOS PCT: 0 %
Eosinophils Absolute: 0 10*3/uL (ref 0–0.7)
HCT: 52.8 % — ABNORMAL HIGH (ref 40.0–52.0)
Hemoglobin: 17.8 g/dL (ref 13.0–18.0)
LYMPHS ABS: 0.7 10*3/uL — AB (ref 1.0–3.6)
LYMPHS PCT: 4 %
MCH: 28.9 pg (ref 26.0–34.0)
MCHC: 33.8 g/dL (ref 32.0–36.0)
MCV: 85.6 fL (ref 80.0–100.0)
MONOS PCT: 6 %
Metamyelocytes Relative: 0 %
Monocytes Absolute: 1.1 10*3/uL — ABNORMAL HIGH (ref 0.2–1.0)
Myelocytes: 0 %
NEUTROS ABS: 15.7 10*3/uL — AB (ref 1.4–6.5)
NRBC: 0 /100{WBCs}
Neutrophils Relative %: 86 %
OTHER: 0 %
PLATELETS: 149 10*3/uL — AB (ref 150–440)
Promyelocytes Absolute: 0 %
RBC: 6.17 MIL/uL — AB (ref 4.40–5.90)
RDW: 15.8 % — AB (ref 11.5–14.5)
WBC: 17.5 10*3/uL — AB (ref 3.8–10.6)

## 2016-09-16 LAB — TROPONIN I: Troponin I: 0.03 ng/mL (ref <0.03)

## 2016-09-16 LAB — GLUCOSE, CAPILLARY
Glucose-Capillary: 260 mg/dL — ABNORMAL HIGH (ref 65–99)
Glucose-Capillary: 288 mg/dL — ABNORMAL HIGH (ref 65–99)

## 2016-09-16 LAB — TSH: TSH: 0.684 u[IU]/mL (ref 0.350–4.500)

## 2016-09-16 MED ORDER — ENOXAPARIN SODIUM 40 MG/0.4ML ~~LOC~~ SOLN
40.0000 mg | SUBCUTANEOUS | Status: DC
  Administered 2016-09-17 – 2016-09-22 (×6): 40 mg via SUBCUTANEOUS
  Filled 2016-09-16 (×6): qty 0.4

## 2016-09-16 MED ORDER — INSULIN ASPART 100 UNIT/ML ~~LOC~~ SOLN
0.0000 [IU] | Freq: Three times a day (TID) | SUBCUTANEOUS | Status: DC
  Administered 2016-09-17: 1 [IU] via SUBCUTANEOUS
  Administered 2016-09-20 – 2016-09-21 (×2): 2 [IU] via SUBCUTANEOUS
  Administered 2016-09-21: 3 [IU] via SUBCUTANEOUS
  Administered 2016-09-21: 2 [IU] via SUBCUTANEOUS
  Administered 2016-09-22 (×2): 7 [IU] via SUBCUTANEOUS
  Administered 2016-09-22: 3 [IU] via SUBCUTANEOUS
  Administered 2016-09-23: 2 [IU] via SUBCUTANEOUS
  Administered 2016-09-23: 3 [IU] via SUBCUTANEOUS
  Filled 2016-09-16: qty 2
  Filled 2016-09-16: qty 3
  Filled 2016-09-16: qty 7
  Filled 2016-09-16: qty 2
  Filled 2016-09-16: qty 3
  Filled 2016-09-16: qty 2
  Filled 2016-09-16: qty 3
  Filled 2016-09-16: qty 7
  Filled 2016-09-16: qty 1

## 2016-09-16 MED ORDER — BENZONATATE 100 MG PO CAPS
100.0000 mg | ORAL_CAPSULE | Freq: Three times a day (TID) | ORAL | Status: AC
  Administered 2016-09-16 – 2016-09-18 (×6): 100 mg via ORAL
  Filled 2016-09-16 (×6): qty 1

## 2016-09-16 MED ORDER — DEXTROSE 5 % IV SOLN
500.0000 mg | INTRAVENOUS | Status: DC
  Administered 2016-09-17: 500 mg via INTRAVENOUS
  Filled 2016-09-16 (×2): qty 500

## 2016-09-16 MED ORDER — LEVOFLOXACIN IN D5W 750 MG/150ML IV SOLN
750.0000 mg | Freq: Once | INTRAVENOUS | Status: AC
  Administered 2016-09-16: 750 mg via INTRAVENOUS
  Filled 2016-09-16: qty 150

## 2016-09-16 MED ORDER — DOCUSATE SODIUM 100 MG PO CAPS
100.0000 mg | ORAL_CAPSULE | Freq: Two times a day (BID) | ORAL | Status: DC
  Administered 2016-09-16 – 2016-09-17 (×2): 100 mg via ORAL
  Filled 2016-09-16 (×2): qty 1

## 2016-09-16 MED ORDER — ONDANSETRON HCL 4 MG PO TABS
4.0000 mg | ORAL_TABLET | Freq: Four times a day (QID) | ORAL | Status: DC | PRN
  Administered 2016-09-20: 4 mg via ORAL
  Filled 2016-09-16: qty 1

## 2016-09-16 MED ORDER — ACETAMINOPHEN 325 MG PO TABS
650.0000 mg | ORAL_TABLET | Freq: Four times a day (QID) | ORAL | Status: DC | PRN
  Administered 2016-09-19 – 2016-09-22 (×3): 650 mg via ORAL
  Filled 2016-09-16 (×3): qty 2

## 2016-09-16 MED ORDER — ACETAMINOPHEN 650 MG RE SUPP: 650.0000 mg | Freq: Four times a day (QID) | RECTAL | Status: DC | PRN

## 2016-09-16 MED ORDER — INSULIN GLARGINE 100 UNIT/ML ~~LOC~~ SOLN
12.0000 [IU] | Freq: Every day | SUBCUTANEOUS | Status: DC
  Administered 2016-09-16 – 2016-09-17 (×2): 12 [IU] via SUBCUTANEOUS
  Filled 2016-09-16 (×3): qty 0.12

## 2016-09-16 MED ORDER — ONDANSETRON HCL 4 MG/2ML IJ SOLN
4.0000 mg | Freq: Four times a day (QID) | INTRAMUSCULAR | Status: DC | PRN
  Administered 2016-09-17: 4 mg via INTRAVENOUS
  Filled 2016-09-16: qty 2

## 2016-09-16 MED ORDER — INSULIN ASPART 100 UNIT/ML ~~LOC~~ SOLN
0.0000 [IU] | Freq: Every day | SUBCUTANEOUS | Status: DC
  Administered 2016-09-16: 3 [IU] via SUBCUTANEOUS
  Administered 2016-09-22: 2 [IU] via SUBCUTANEOUS
  Filled 2016-09-16: qty 2
  Filled 2016-09-16: qty 3

## 2016-09-16 MED ORDER — DEXTROSE 5 % IV SOLN
1.0000 g | INTRAVENOUS | Status: AC
  Administered 2016-09-16 – 2016-09-20 (×5): 1 g via INTRAVENOUS
  Filled 2016-09-16 (×5): qty 10

## 2016-09-16 MED ORDER — SODIUM CHLORIDE 0.9 % IV SOLN
INTRAVENOUS | Status: DC
  Administered 2016-09-16 – 2016-09-22 (×7): via INTRAVENOUS

## 2016-09-16 MED ORDER — SODIUM CHLORIDE 0.9% FLUSH
3.0000 mL | Freq: Two times a day (BID) | INTRAVENOUS | Status: DC
  Administered 2016-09-17 – 2016-09-22 (×5): 3 mL via INTRAVENOUS

## 2016-09-16 MED ORDER — SODIUM CHLORIDE 0.9 % IV BOLUS (SEPSIS)
1000.0000 mL | Freq: Once | INTRAVENOUS | Status: AC
  Administered 2016-09-16: 1000 mL via INTRAVENOUS

## 2016-09-16 NOTE — Progress Notes (Signed)
  Pharmacy Antibiotic Note  Jim [name] is a 82 y.o. male admitted on 09/16/2016 with sepsis secondary to  pneumonia.  Pharmacy has been consulted for Ceftriaxone  dosing.  Plan: Will start Rocephin 1 g IV q24 hours.    Height: 6\' 1"  (185.4 cm) Weight: 160 lb 8 oz (72.8 kg) IBW/kg (Calculated) : 79.9  Temp (24hrs), Avg:99.5 F (37.5 C), Min:99.5 F (37.5 C), Max:99.5 F (37.5 C)   Recent Labs Lab 09/16/16 1445  WBC 17.5*    CrCl cannot be calculated (Patient's most recent lab result is older than the maximum 21 days allowed.).    Allergies  Allergen Reactions  . Penicillins Rash    Antimicrobials this admission: Azithromycin  >>    >>   Dose adjustments this admission:   Microbiology results:  BCx:   UCx:   Sputum:    MRSA PCR:   Thank you for allowing pharmacy to be a part of this patient's care.  Jeaneen Cala D 09/16/2016 9:54 PM

## 2016-09-16 NOTE — ED Provider Notes (Signed)
[facility] Emergency Department Provider Note  Time seen: 2:56 PM  I have reviewed the triage vital signs and the nursing notes.   HISTORY  Chief Complaint Fall and Hypotension    HPI Jim [name] is a 82 y.o. male with a past medical history of diabetes who presents the emergency department after a fall, nausea, vomiting.According to EMS they were called out to the patient's residence for help getting up from the bathroom floor. Patient states he became very weak and slumped down in the bathroom floor. Denies hitting his head or LOC. EMS states the patient was hypotensive 90/60. Patient states he was having some back pain so he took an oxycodone this morning on an empty stomach which is why he attributes the low blood pressure as well as the nausea and vomiting 2. Upon arrival patient has a blood pressure 117/59, however he has a temperature of 99.5 and is hypoxic to 89% on room air, patient has no baseline oxygen requirement. Patient denies any complaints in the emergency department. Denies any chest pain at any point. Denies any cough or congestion.  Past Medical History:  Diagnosis Date  . Diabetes mellitus without complication (HCC)     There are no active problems to display for this patient.   Past Surgical History:  Procedure Laterality Date  . LEG AMPUTATION BELOW KNEE  1967    Prior to Admission medications   Not on File    Allergies  Allergen Reactions  . Penicillins Rash    No family history on file.  Social History Social History  Substance Use Topics  . Smoking status: Former Smoker    Types: Cigarettes    Quit date: 12/30/1995  . Smokeless tobacco: Never Used  . Alcohol use No    Review of Systems Constitutional: Low-grade fever in the ER. Cardiovascular: Negative for chest pain. Respiratory: Negative for shortness of breath. Negative for cough Gastrointestinal: Negative for abdominal pain. Positive for nausea and vomiting  1 this morning. Genitourinary: Negative for dysuria. Musculoskeletal: Negative for back pain. Neurological: Negative for headache 10-point ROS otherwise negative.  ____________________________________________   PHYSICAL EXAM:  VITAL SIGNS: ED Triage Vitals  Enc Vitals Group     BP 09/16/16 1413 (!) 117/59     Pulse Rate 09/16/16 1413 (!) 48     Resp 09/16/16 1413 17     Temp 09/16/16 1417 99.5 F (37.5 C)     Temp Source 09/16/16 1417 Oral     SpO2 09/16/16 1356 (!) 89 %     Weight 09/16/16 1418 160 lb 15 oz (73 kg)     Height 09/16/16 1418 6\' 1"  (1.854 m)     Head Circumference --      Peak Flow --      Pain Score --      Pain Loc --      Pain Edu? --      Excl. in GC? --     Constitutional: Alert and oriented. Well appearing and in no distress. Eyes: Normal exam ENT   Head: Normocephalic and atraumatic   Mouth/Throat: Mucous membranes are moist. Cardiovascular: Normal rate, regular rhythm. No murmur Respiratory: Normal respiratory effort without tachypnea nor retractions. Breath sounds are clear  Gastrointestinal: Soft and nontender. No distention.   Musculoskeletal: Nontender with normal range of motion in all extremities. Lower extremity tenderness. No C-spine tenderness. Neurologic:  Normal speech and language. No gross focal neurologic deficits  Skin:  Skin is warm, dry  and intact.  Psychiatric: Mood and affect are normal.   ____________________________________________    EKG  EKG reviewed and interpreted by myself shows normal sinus rhythm at 94 bpm, widened QRS, nonspecific ST changes but no obvious ST elevation. Occasional PVCs. Abnormal EKG but not consistent with STEMI.  ____________________________________________    RADIOLOGY  X-ray consistent with pneumonia  ____________________________________________   INITIAL IMPRESSION / ASSESSMENT AND PLAN / ED COURSE  Pertinent labs & imaging results that were available during my care of the  patient were reviewed by me and considered in my medical decision making (see chart for details).  Patient presents the emergency department after a fall. Initially hypotensive per EMS. Normotensive in the emergency department with the patient is hypoxic to 89% on room air. We'll obtain labs, IV hydrate, obtain a chest x-ray and close monitor in the ER. Patient has no complaints is alert and oriented.  ----------------------------------------- 5:59 PM on 09/16/2016 -----------------------------------------  X-rays consistent with pneumonia. Blood cultures have been ordered. IV Levaquin has been started. Given the patient's hypoxia with her elevated white blood cell count and a chest x-ray consistent with pneumonia patient will be admitted to the hospital for further treatment. Unfortunately due to lab down time, then multiple hemolyzed lab draws we are still waiting for the patient's chemistry as well as troponin result before the patient could be admitted to the hospital. We are sending blood down for the third time at this point for the lab to run a troponin and chemistry.   Troponin negative. Chemistry hemolyzed, resending blood for the fourth time. Patient admitted to the hospitalist service. ____________________________________________   FINAL CLINICAL IMPRESSION(S) / ED DIAGNOSES  Fall Hypoxia  hypotension Pneumonia    Minna Antis, MD 09/16/16 [phone]

## 2016-09-16 NOTE — ED Triage Notes (Signed)
Pt in with from home via EMS; EMS called for assistance to help pt get up from fall in bathroom this morning.  Upon assessment, pt hypotensive at 90/60.  Pt reports taking an oxycodone on an empty stomach, contributes that to his fall as well as to his N/V which occurred en route.  EMS reports BP 87/58, bag received en route.  Pt denies hitting his head, denies taking blood thinners, pt only complaints are of being tired and dry mouth.  Pt A/Ox3, MD to bedside at this time.

## 2016-09-16 NOTE — H&P (Signed)
[facility] - [place] [facility]   PATIENT NAME: Jim [name]    MR#:  [id number]  DATE OF BIRTH:  01-10-1930  DATE OF ADMISSION:  09/16/2016  PRIMARY CARE PHYSICIAN: Leanna Sato, MD   REQUESTING/REFERRING PHYSICIAN: Dr. Minna Antis  CHIEF COMPLAINT:   Chief Complaint  Patient presents with  . Fall  . Hypotension    HISTORY OF PRESENT ILLNESS:  Jim [name]  is a 82 y.o. male with a known history of Diabetes mellitus, insulin dependent presents to hospital secondary to chills, myalgias and falls at home. Noted to have pneumonia. Patient is a very poor historian. His step daughter at bedside provides most of the history. Patient has been complaining of fevers and chills for the last couple of days. It was mostly today that his symptoms have worsened. He felt he couldn't breathe, has been having dry cough and was weak and had fallen at home. He had a low-grade temperature of 99.41F, elevated white count of 17.5 K and chest x-ray showing pneumonia. Patient has been complaining of upper respiratory symptoms for the last couple of days. Started with cold and congestion.  PAST MEDICAL HISTORY:   Past Medical History:  Diagnosis Date  . Diabetes mellitus without complication (HCC)     PAST SURGICAL HISTORY:   Past Surgical History:  Procedure Laterality Date  . LEG AMPUTATION BELOW KNEE  1967    SOCIAL HISTORY:   Social History  Substance Use Topics  . Smoking status: Former Smoker    Types: Cigarettes    Quit date: 12/30/1995  . Smokeless tobacco: Never Used  . Alcohol use No    FAMILY HISTORY:  No family history on file.  DRUG ALLERGIES:   Allergies  Allergen Reactions  . Penicillins Rash    REVIEW OF SYSTEMS:   Review of Systems  Constitutional: Positive for chills and malaise/fatigue. Negative for fever and weight loss.  HENT: Positive for congestion. Negative for ear discharge, ear pain, hearing loss, nosebleeds and tinnitus.     Eyes: Negative for blurred vision, double vision and photophobia.  Respiratory: Positive for cough and shortness of breath. Negative for hemoptysis and wheezing.   Cardiovascular: Negative for chest pain, palpitations, orthopnea and leg swelling.  Gastrointestinal: Positive for nausea and vomiting. Negative for abdominal pain, constipation, diarrhea, heartburn and melena.  Genitourinary: Negative for dysuria, frequency and urgency.  Musculoskeletal: Positive for falls and myalgias. Negative for back pain and neck pain.  Skin: Negative for rash.  Neurological: Negative for dizziness, tingling, tremors, sensory change, speech change, focal weakness and headaches.  Endo/Heme/Allergies: Does not bruise/bleed easily.  Psychiatric/Behavioral: Negative for depression.    MEDICATIONS AT HOME:   Prior to Admission medications   Not on File      VITAL SIGNS:  Blood pressure 112/77, pulse 87, temperature 99.5 F (37.5 C), temperature source Oral, resp. rate 18, height 6\' 1"  (1.854 m), weight 73 kg (160 lb 15 oz), SpO2 93 %.  PHYSICAL EXAMINATION:   Physical Exam  GENERAL:  82 y.o.-year-old disshelved appearing patient lying in the bed with no acute distress.  EYES: Pupils equal, round, reactive to light and accommodation. No scleral icterus. Extraocular muscles intact.  HEENT: Head atraumatic, normocephalic. Oropharynx and nasopharynx clear.  NECK:  Supple, no jugular venous distention. No thyroid enlargement, no tenderness.  LUNGS: Normal breath sounds bilaterally, no wheezing, rales,rhonchi or crepitation. No use of accessory muscles of respiration. Decreased right basilar breath sounds CARDIOVASCULAR: S1, S2 normal. No murmurs,  rubs, or gallops.  ABDOMEN: Soft, nontender, nondistended. Bowel sounds present. No organomegaly or mass.  EXTREMITIES: No pedal edema, cyanosis, or clubbing. S/p left BKA NEUROLOGIC: Cranial nerves II through XII are intact. Muscle strength 5/5 in all  extremities. Sensation intact. Gait not checked.  PSYCHIATRIC: The patient is alert and oriented x 2-3.  SKIN: Poor skin care, dry skin, scaly. No obvious rash, lesion, or ulcer.   LABORATORY PANEL:   CBC  Recent Labs Lab 09/16/16 1445  WBC 17.5*  HGB 17.8  HCT 52.8*  PLT 149*   ------------------------------------------------------------------------------------------------------------------  Chemistries  No results for input(s): NA, K, CL, CO2, GLUCOSE, BUN, CREATININE, CALCIUM, MG, AST, ALT, ALKPHOS, BILITOT in the last 168 hours.  Invalid input(s): GFRCGP ------------------------------------------------------------------------------------------------------------------  Cardiac Enzymes  Recent Labs Lab 09/16/16 1750  TROPONINI 0.03*   ------------------------------------------------------------------------------------------------------------------  RADIOLOGY:  Dg Chest 2 View  Result Date: 09/16/2016 CLINICAL DATA:  Larey SeatFell this morning.  Shortness of breath. EXAM: CHEST  2 VIEW COMPARISON:  05/09/2010 FINDINGS: Artifact overlies the chest. Heart size is normal. There is aortic atherosclerosis. The left lung is clear. There is right lower and middle lobe pneumonia. Chronic degenerative changes affect the spine. IMPRESSION: Right lower and middle lobe pneumonia. No dense consolidation or lobar collapse. Aortic atherosclerosis. Electronically Signed   By: Paulina FusiMark  Shogry [name].   On: 09/16/2016 15:54    EKG:  No orders found for this or any previous visit.  IMPRESSION AND PLAN:   Jim [name]  is a 82 y.o. male with a known history of Diabetes mellitus, insulin dependent presents to hospital secondary to chills, myalgias and falls at home. Noted to have pneumonia.  #1 sepsis-continue to right middle lobe and lower lobe pneumonia. Community-acquired pneumonia. -Blood cultures ordered. Cough meds -O2 support as needed -Started on Rocephin and azithromycin. -Also check UA  #2  diabetes mellitus-check A1c. -Takes Lantus at home. Unknown dosage. Need to be verified. Started on sliding scale insulin and Lantus at bedtime  #3 other labs are pending at this time due to repeated hemolysis of his blood  #4 DVT prophylaxis-Lovenox  Physical therapy consult is for his falls    All the records are reviewed and case discussed with ED provider. Management plans discussed with the patient, family and they are in agreement.  CODE STATUS: Full Code  TOTAL TIME TAKING CARE OF THIS PATIENT: 50 minutes.    Jim [name],Jim [name] on 09/16/2016 at 7:11 PM  Between 7am to 6pm - Pager - [phone]  After 6pm go to [email or web address] - password [email or web address] [email or web address]  [facility] [place] [facility]  Office  [phone]  CC: Primary care physician; Leanna SatoMILES,LINDA M, MD

## 2016-09-16 NOTE — Progress Notes (Signed)
Colace 100mg  bid ordered per Dr. Lenord FellersHuglemeyer

## 2016-09-16 NOTE — ED Notes (Signed)
Pt blood sugar was checked and pt was re-positioned in bed due to pain verbalized. Pt states that he is unable to use the restroom to produce stool or urine at this time. Pt stated "Just get me out of this d**n room and Id be able to pee... Cant you get me a room now? I cant stand it in here!" pt was redirected and reminded that if and when he is admitted.Marland Kitchen. a room with be avaliable to him when there is a vacancy on the floor and that we have no control over that process. Pt was more calm upon leaving. Pt states that nothing is needed by staff at this time

## 2016-09-16 NOTE — ED Notes (Signed)
Pt family (step daughter) was informed of the admission process as I explained the steps to her father at [id number]. Daughter was seen verbalizing her frustration to the secretary on B side. Pt family again verbalized their frustration with the admission process stating " This is ridiculous. Ive never seen it take this long to get to a room in my life." The pt stated " Why is this s**t taking so long? I just want to get in my room!" Pt and family was again re-reminded of the process as I apologized for the time its taken. Rn has been informed of the frequency of concerns regarding the pt and their rooming status

## 2016-09-16 NOTE — ED Notes (Signed)
1A called to inquire about status of bed assignment; was told that a stat clean has been put in for that room and bed will be accepted once that has been done.

## 2016-09-16 NOTE — ED Notes (Signed)
Third green top collected at this time per lab request and resent.

## 2016-09-16 NOTE — ED Notes (Signed)
Second green top collected per lab request due to hemolyzed sample.  Specimen sent to lab.  Attempted to call lab to notify them but unable to reach anyone via phone.

## 2016-09-17 DIAGNOSIS — L8990 Pressure ulcer of unspecified site, unspecified stage: Secondary | ICD-10-CM | POA: Insufficient documentation

## 2016-09-17 LAB — CBC
HCT: 45.3 % (ref 40.0–52.0)
HEMOGLOBIN: 15.3 g/dL (ref 13.0–18.0)
MCH: 28.5 pg (ref 26.0–34.0)
MCHC: 33.8 g/dL (ref 32.0–36.0)
MCV: 84.4 fL (ref 80.0–100.0)
Platelets: 159 10*3/uL (ref 150–440)
RBC: 5.36 MIL/uL (ref 4.40–5.90)
RDW: 15.4 % — ABNORMAL HIGH (ref 11.5–14.5)
WBC: 14 10*3/uL — AB (ref 3.8–10.6)

## 2016-09-17 LAB — BASIC METABOLIC PANEL
ANION GAP: 6 (ref 5–15)
BUN: 12 mg/dL (ref 6–20)
CALCIUM: 8.2 mg/dL — AB (ref 8.9–10.3)
CO2: 26 mmol/L (ref 22–32)
Chloride: 107 mmol/L (ref 101–111)
Creatinine, Ser: 0.75 mg/dL (ref 0.61–1.24)
Glucose, Bld: 150 mg/dL — ABNORMAL HIGH (ref 65–99)
Potassium: 3.9 mmol/L (ref 3.5–5.1)
Sodium: 139 mmol/L (ref 135–145)

## 2016-09-17 LAB — GLUCOSE, CAPILLARY
GLUCOSE-CAPILLARY: 128 mg/dL — AB (ref 65–99)
GLUCOSE-CAPILLARY: 107 mg/dL — AB (ref 65–99)
Glucose-Capillary: 85 mg/dL (ref 65–99)
Glucose-Capillary: 154 mg/dL — ABNORMAL HIGH (ref 65–99)

## 2016-09-17 LAB — URINALYSIS COMPLETE WITH MICROSCOPIC (ARMC ONLY)
BILIRUBIN URINE: NEGATIVE
HGB URINE DIPSTICK: NEGATIVE
Ketones, ur: NEGATIVE mg/dL
NITRITE: POSITIVE — AB
Protein, ur: NEGATIVE mg/dL
SPECIFIC GRAVITY, URINE: 1.009 (ref 1.005–1.030)
pH: 7 (ref 5.0–8.0)

## 2016-09-17 LAB — HEMOGLOBIN A1C
HEMOGLOBIN A1C: 11.3 % — AB (ref 4.8–5.6)
MEAN PLASMA GLUCOSE: 278 mg/dL

## 2016-09-17 LAB — PROCALCITONIN: PROCALCITONIN: 0.45 ng/mL

## 2016-09-17 MED ORDER — POLYETHYLENE GLYCOL 3350 17 G PO PACK
17.0000 g | PACK | Freq: Every day | ORAL | Status: DC
  Administered 2016-09-17 – 2016-09-19 (×3): 17 g via ORAL
  Filled 2016-09-17 (×3): qty 1

## 2016-09-17 MED ORDER — BISACODYL 5 MG PO TBEC
10.0000 mg | DELAYED_RELEASE_TABLET | Freq: Every day | ORAL | Status: DC
  Administered 2016-09-17 – 2016-09-19 (×3): 10 mg via ORAL
  Filled 2016-09-17 (×3): qty 2

## 2016-09-17 MED ORDER — SENNOSIDES-DOCUSATE SODIUM 8.6-50 MG PO TABS
2.0000 | ORAL_TABLET | Freq: Two times a day (BID) | ORAL | Status: DC
  Administered 2016-09-17 – 2016-09-19 (×4): 2 via ORAL
  Filled 2016-09-17 (×6): qty 2

## 2016-09-17 MED ORDER — OXYCODONE HCL 5 MG PO TABS
7.5000 mg | ORAL_TABLET | Freq: Four times a day (QID) | ORAL | Status: DC | PRN
  Administered 2016-09-17 – 2016-09-18 (×5): 7.5 mg via ORAL
  Filled 2016-09-17 (×5): qty 2

## 2016-09-17 MED ORDER — FAMOTIDINE IN NACL 20-0.9 MG/50ML-% IV SOLN
20.0000 mg | Freq: Two times a day (BID) | INTRAVENOUS | Status: DC
  Administered 2016-09-17 – 2016-09-18 (×3): 20 mg via INTRAVENOUS
  Filled 2016-09-17 (×4): qty 50

## 2016-09-17 NOTE — Progress Notes (Signed)
  Pharmacy Antibiotic Note  Jim [name] is a 82 y.o. male admitted on 09/16/2016 with sepsis secondary to  pneumonia.  Pharmacy has been consulted for Ceftriaxone  dosing.  Plan: Will start Rocephin 1 g IV q24 hours.  Patient also on azithromycin 500mg  IV daily, will ask to change to 250mg  for tomorrow.    Height: 6\' 1"  (185.4 cm) Weight: 160 lb 8 oz (72.8 kg) IBW/kg (Calculated) : 79.9  Temp (24hrs), Avg:99 F (37.2 C), Min:98.6 F (37 C), Max:99.5 F (37.5 C)   Recent Labs Lab 09/16/16 1445 09/17/16 0424  WBC 17.5* 14.0*  CREATININE  --  0.75    Estimated Creatinine Clearance: 72 mL/min (by C-G formula based on SCr of 0.75 mg/dL).    Allergies  Allergen Reactions  . Penicillins Rash    Antimicrobials this admission: Azithromycin 9/20 >>   Ceftriaxone 9/19>>   Dose adjustments this admission:   Microbiology results:  BCx: P  UCx:   Sputum:    MRSA PCR:   Thank you for allowing pharmacy to be a part of this patient's care.  Delsa BernKelly m Fuhrmann, PharmD 09/17/2016 11:07 AM

## 2016-09-17 NOTE — Progress Notes (Signed)
PT Cancellation Note   Patient Details Name: Jim [name] L Haggar MRN: [id number] DOB: December 26, 1930   Cancelled Treatment:     Per Dr. Sherryll BurgerShah and nursing pt is not appropriate for skilled PT services at this time secondary to pt currently at his baseline functionally including w/c bound.  Will reassess pending a change in status and receipt of new PT orders.   [name]. Scott Nelissa Bolduc PT, DPT 09/17/16, 5:18 PM

## 2016-09-17 NOTE — Progress Notes (Signed)
Patient continues to improve, now on rm air. VS wnl. Pain med given as requested. Family at bedside updated. Made rounds with Dr Sherryll BurgerShah. New orders obtained and administered. MD aware of pt's constipation.

## 2016-09-17 NOTE — Clinical Social Work Note (Signed)
Clinical Social Work Assessment  Patient Details  Name: Jim [name] MRN: [id number] Date of Birth: [birth year]  Date of referral:  09/17/16               Reason for consult:  Discharge Planning, Other (Comment Required) (Multiple Falls at home per RN. )                Permission sought to share information with:    Permission granted to share information::     Name::        Agency::     Relationship::     Contact Information:     Housing/Transportation Living arrangements for the past 2 months:  Single Family Home Source of Information:  Patient Patient Interpreter Needed:  None Criminal Activity/Legal Involvement Pertinent to Current Situation/Hospitalization:  No - Comment as needed Significant Relationships:  Adult Children Lives with:  Adult Children Do you feel safe going back to the place where you live?  Yes Need for family participation in patient care:  Yes (Comment)  Care giving concerns:  Patient [place] in [place] with his son Jim [name] and is wheel chair bound at baseline.    Social Worker assessment / plan:  Holiday representative (Eugene) received verbal consult from RN in progression rounds that patient has fallen multiple time from his wheel chair. CSW met with patient alone at bedside to discuss D/C plan. Patient was alert and oriented and was laying in the bed. CSW introduced self and explained role of CSW department. Patient reported that he lives with his son Jim [name] and uses a wheel chair at baseline. Per patient he has been using the wheel chair as his primary means of getting around for over 1 year now. Patient reported that his son works during the day and he is left alone sometimes. Patient reported that he fell out of his wheel chair this time because he was dehydrated and not feeling well. When CSW asked patient if he falls often patient reported that he has only fell 3 times in the last year, when he starts feeling sick. Patient reported that his plan is to return home  when he leaves [facility]. Patient reported no needs or concerns at this time. CSW will continue to follow and assist as needed.   Employment status:  Disabled (Comment on whether or not currently receiving Disability) Insurance information:  Managed Medicare PT Recommendations:  Not assessed at this time (Per MD progress note PT will not be ordered because patient is wheel chair bound at baseline. ) Information / Referral to community resources:     Patient/Family's Response to care:  Patient reported that he is going home when he leaves [facility].   Patient/Family's Understanding of and Emotional Response to Diagnosis, Current Treatment, and Prognosis:  Patient was pleasant and thanked CSW for visit.  Emotional Assessment Appearance:  Appears stated age Attitude/Demeanor/Rapport:    Affect (typically observed):  Pleasant Orientation:  Oriented to Self, Oriented to Place, Oriented to  Time, Fluctuating Orientation (Suspected and/or reported Sundowners) Alcohol / Substance use:  Not Applicable Psych involvement (Current and /or in the community):  No (Comment)  Discharge Needs  Concerns to be addressed:  Discharge Planning Concerns Readmission within the last 30 days:  No Current discharge risk:  Chronically ill Barriers to Discharge:  Continued Medical Work up   [employer], Veronia Beets, LCSW 09/17/2016, 3:27 PM

## 2016-09-17 NOTE — Progress Notes (Signed)
Pt. Is on oxycodone 7.5mg  q6h prn at home. Dr. Emmit PomfretHugelmeyer ordered medication for hospital use.

## 2016-09-17 NOTE — Progress Notes (Signed)
Inpatient Diabetes Program Recommendations  AACE/ADA: New Consensus Statement on Inpatient Glycemic Control (2015)  Target Ranges:  Prepandial:   less than 140 mg/dL      Peak postprandial:   less than 180 mg/dL (1-2 hours)      Critically ill patients:  140 - 180 mg/dL   Results for Jim [name], Jim [name] (MRN [id number]) as of 09/17/2016 14:54  Ref. Range 09/16/2016 19:53 09/16/2016 21:45 09/17/2016 07:55 09/17/2016 11:28  Glucose-Capillary Latest Ref Range: 65 - 99 mg/dL 782260 (H) 956288 (H) 213128 (H) 85  Results for Jim [name], Jim [name] (MRN [id number]) as of 09/17/2016 14:54  Ref. Range 09/16/2016 17:50  Hemoglobin A1C Latest Ref Range: 4.8 - 5.6 % 11.3 (H)   Review of Glycemic Control  Diabetes history: DM2 Outpatient Diabetes medications: Novolog 70/30 45 units daily in the evening (around 8:00-9:00 pm) Current orders for Inpatient glycemic control: Lantus 12 units QHS, Novolog 0-9 units TID with meals, Novolog 0-5 units QHS  Inpatient Diabetes Program Recommendations: HgbA1C: 11.3% on 09/16/16 indicating an average glucose of 278 mg/dl.  Patient will need to follow up with outpatient doctor for DM control.   Note: Spoke with patient, his daughter, and son about diabetes and home regimen for diabetes control. Patient reports that [facility] is [facility] and currently he takes Novolog 70/30 45 units daily in the evening as an outpatient for diabetes control. Patient reports that he is taking insulin as prescribed.  Patient and his family report that no changes have been made with his insulin dosages in years.  Patient states that he checks his glucose 1-2 times per day and that it is usually 200-300's mg/dl.  Discussed A1C results (11.3% on 09/16/16) and explained that his current A1C indicates an average glucose of 278 mg/dl over the past 2-3 months. Discussed glucose and A1C goals. Discussed importance of checking CBGs and maintaining good CBG control to prevent long-term and  short-term complications.  Encouraged patient to continue checking his glucose as recommended and to talk with his doctor at [facility] about making some adjustment with DM medications to improve diabetes control.   Patient and his family verbalized understanding of information discussed and they state that they have no further questions at this time related to diabetes.  MD- At time of discharge, MD may want to consider discharging patient on insulin comparable to what he is receiving as an inpatient if his glucose is controlled.  Thanks, [name], RN, MSN, CDE Diabetes Coordinator Inpatient Diabetes Program [phone] (Team Pager) [phone] ([facility] office) [phone] [facility] office) [phone] [facility] office)

## 2016-09-17 NOTE — Progress Notes (Signed)
[facility] - [place] [facility]   PATIENT NAME: Jim [name]    MR#:  [id number]  DATE OF BIRTH:  09/12/1930  SUBJECTIVE:  CHIEF COMPLAINT:   Chief Complaint  Patient presents with  . Fall  . Hypotension  constipated, (no BM for 10 days), pain under control. Son at bedside, pt uses motorized wheel chair at baseline at home. (lt leg amputee), says he felt dehydrated at home REVIEW OF SYSTEMS:  Review of Systems  Constitutional: Positive for malaise/fatigue. Negative for chills, fever and weight loss.  HENT: Negative for nosebleeds and sore throat.   Eyes: Negative for blurred vision.  Respiratory: Negative for cough, shortness of breath and wheezing.   Cardiovascular: Negative for chest pain, orthopnea, leg swelling and PND.  Gastrointestinal: Positive for constipation. Negative for abdominal pain, diarrhea, heartburn, nausea and vomiting.  Genitourinary: Negative for dysuria and urgency.  Musculoskeletal: Positive for back pain, falls and myalgias.  Skin: Negative for rash.  Neurological: Positive for weakness. Negative for dizziness, speech change, focal weakness and headaches.  Endo/Heme/Allergies: Does not bruise/bleed easily.  Psychiatric/Behavioral: Negative for depression.   DRUG ALLERGIES:   Allergies  Allergen Reactions  . Penicillins Rash   VITALS:  Blood pressure (!) 132/49, pulse 65, temperature 98.9 F (37.2 C), temperature source Oral, resp. rate 19, height 6\' 1"  (1.854 [name]), weight 72.8 kg (160 lb 8 oz), SpO2 93 %. PHYSICAL EXAMINATION:  Physical Exam  Constitutional: He is oriented to person, place, and time and well-developed, well-nourished, and in no distress.  HENT:  Head: Normocephalic and atraumatic.  Eyes: Conjunctivae and EOM are normal. Pupils are equal, round, and reactive to light.  Neck: Normal range of motion. Neck supple. No tracheal deviation present. No thyromegaly present.  Cardiovascular: Normal rate, regular rhythm  and normal heart sounds.   Pulmonary/Chest: Effort normal and breath sounds normal. No respiratory distress. He has no wheezes. He exhibits no tenderness.  Abdominal: Soft. Bowel sounds are normal. He exhibits no distension. There is no tenderness.  Musculoskeletal: Normal range of motion.  Lt leg Amputee  Neurological: He is alert and oriented to person, place, and time. No cranial nerve deficit.  Skin: Skin is warm and dry. No rash noted.  Psychiatric: Mood and affect normal.   LABORATORY PANEL:   CBC  Recent Labs Lab 09/17/16 0424  WBC 14.0*  HGB 15.3  HCT 45.3  PLT 159   ------------------------------------------------------------------------------------------------------------------ Chemistries   Recent Labs Lab 09/17/16 0424  NA 139  K 3.9  CL 107  CO2 26  GLUCOSE 150*  BUN 12  CREATININE 0.75  CALCIUM 8.2*   RADIOLOGY:  Dg Chest 2 View  Result Date: 09/16/2016 CLINICAL DATA:  Larey Seat this morning.  Shortness of breath. EXAM: CHEST  2 VIEW COMPARISON:  05/09/2010 FINDINGS: Artifact overlies the chest. Heart size is normal. There is aortic atherosclerosis. The left lung is clear. There is right lower and middle lobe pneumonia. Chronic degenerative changes affect the spine. IMPRESSION: Right lower and middle lobe pneumonia. No dense consolidation or lobar collapse. Aortic atherosclerosis. Electronically Signed   By: Jim [name] [name].D.   On: 09/16/2016 15:54   ASSESSMENT AND PLAN:  Jim [name]  is a 82 y.o. male with a known history of Diabetes mellitus, insulin dependent presents to hospital secondary to chills, myalgias and falls at home. Noted to have pneumonia.  #1 sepsis- Present on admission. due to right middle lobe and lower lobe pneumonia. Community-acquired pneumonia. -Blood cultures neg. Cough  meds prn -O2 support as needed - wean as tolerated -continue Rocephin and azithromycin. - check procalcitonin to decide course of abx - repeat cxr and labs in am -Also  check UA  #2 diabetes mellitus-A1c > 11 suggestive of poor control -Takes Lantus 20 units at home. continue sliding scale insulin and Lantus at bedtime - DM nurse c/s  #3 Constipation - reports no BM for 10 days - due to chronic opiate use - add miralax, senokot-s and dulcolax until BM  #4 Peripheral arterial disease Status post left lower extremity amputation and wheelchair bound  #5 Hypertension: noncompliant with meds. Monitor for now.   DVT prophylaxis-Lovenox   D/C telemetry, no need of PT as normally uses wheelchair anyways.  All the records are reviewed and case discussed with Care Management/Social Worker. Management plans discussed with the patient, family (son at bedside), nursing and they are in agreement.  CODE STATUS: FULL CODE  TOTAL TIME TAKING CARE OF THIS PATIENT: 35 minutes.   More than 50% of the time was spent in counseling/coordination of care: YES  POSSIBLE D/C IN 1-2 DAYS, DEPENDING ON CLINICAL CONDITION.   [facility], Jim [name] [name].D on 09/17/2016 at 7:58 AM  Between 7am to 6pm - Pager - [phone]  After 6pm go to [email or web address] - Social research officer, governmentpassword EPAS [email or web address]  [facility] [place] [facility]  Office  [phone]  CC: Primary care physician; Jim SatoMILES,LINDA M, MD  Note: This dictation was prepared with Dragon dictation along with smaller phrase technology. Any transcriptional errors that result from this process are unintentional.

## 2016-09-18 ENCOUNTER — Inpatient Hospital Stay: Payer: Medicare HMO

## 2016-09-18 LAB — CBC
HEMATOCRIT: 47.4 % (ref 40.0–52.0)
Hemoglobin: 16.3 g/dL (ref 13.0–18.0)
MCH: 29.3 pg (ref 26.0–34.0)
MCHC: 34.4 g/dL (ref 32.0–36.0)
MCV: 85.4 fL (ref 80.0–100.0)
Platelets: 155 10*3/uL (ref 150–440)
RBC: 5.55 MIL/uL (ref 4.40–5.90)
RDW: 15.2 % — AB (ref 11.5–14.5)
WBC: 9.3 10*3/uL (ref 3.8–10.6)

## 2016-09-18 LAB — BASIC METABOLIC PANEL
ANION GAP: 7 (ref 5–15)
BUN: 9 mg/dL (ref 6–20)
CALCIUM: 8.3 mg/dL — AB (ref 8.9–10.3)
CO2: 27 mmol/L (ref 22–32)
Chloride: 107 mmol/L (ref 101–111)
Creatinine, Ser: 0.74 mg/dL (ref 0.61–1.24)
GFR calc non Af Amer: >60 mL/min (ref >60)
Glucose, Bld: 69 mg/dL (ref 65–99)
Potassium: 3.6 mmol/L (ref 3.5–5.1)
SODIUM: 141 mmol/L (ref 135–145)

## 2016-09-18 LAB — GLUCOSE, CAPILLARY
GLUCOSE-CAPILLARY: 70 mg/dL (ref 65–99)
GLUCOSE-CAPILLARY: 95 mg/dL (ref 65–99)
Glucose-Capillary: 68 mg/dL (ref 65–99)
Glucose-Capillary: 130 mg/dL — ABNORMAL HIGH (ref 65–99)

## 2016-09-18 MED ORDER — AZITHROMYCIN 500 MG PO TABS
500.0000 mg | ORAL_TABLET | Freq: Every day | ORAL | Status: DC
  Administered 2016-09-18: 500 mg via ORAL
  Filled 2016-09-18: qty 1

## 2016-09-18 MED ORDER — FLEET ENEMA 7-19 GM/118ML RE ENEM
1.0000 | ENEMA | RECTAL | Status: AC
  Administered 2016-09-18: 1 via RECTAL

## 2016-09-18 MED ORDER — INSULIN GLARGINE 100 UNIT/ML ~~LOC~~ SOLN
10.0000 [IU] | Freq: Every day | SUBCUTANEOUS | Status: DC
  Administered 2016-09-18: 10 [IU] via SUBCUTANEOUS
  Filled 2016-09-18: qty 0.1

## 2016-09-18 MED ORDER — FAMOTIDINE 20 MG PO TABS
20.0000 mg | ORAL_TABLET | Freq: Two times a day (BID) | ORAL | Status: DC
  Administered 2016-09-19 – 2016-09-23 (×9): 20 mg via ORAL
  Filled 2016-09-18 (×10): qty 1

## 2016-09-18 MED ORDER — LORAZEPAM 2 MG/ML IJ SOLN
1.0000 mg | Freq: Once | INTRAMUSCULAR | Status: AC
  Administered 2016-09-18: 1 mg via INTRAVENOUS
  Filled 2016-09-18: qty 1

## 2016-09-18 NOTE — Progress Notes (Signed)
Inpatient Diabetes Program Recommendations  AACE/ADA: New Consensus Statement on Inpatient Glycemic Control (2015)  Target Ranges:  Prepandial:   less than 140 mg/dL      Peak postprandial:   less than 180 mg/dL (1-2 hours)      Critically ill patients:  140 - 180 mg/dL   Lab Results  Component Value Date   GLUCAP 95 09/18/2016   HGBA1C 11.3 (H) 09/16/2016    Review of Glycemic ControlResults for Menefee, Yared L (MRN [id number]) as of 09/18/2016 11:46  Ref. Range 09/17/2016 11:28 09/17/2016 15:57 09/17/2016 20:51 09/18/2016 09:32 09/18/2016 11:10  Glucose-Capillary Latest Ref Range: 65 - 99 mg/dL 85 865107 (H) 784154 (H) 70 95    Inpatient Diabetes Program Recommendations:   Consider reduction of Lantus to 10 units daily.  Note patient is not eating consistently.   Thanks, Beryl MeagerJenny Jae Bruck, RN, BC-ADM Inpatient Diabetes Coordinator Pager [phone] (8a-5p)

## 2016-09-18 NOTE — Progress Notes (Signed)
PHARMACIST - PHYSICIAN COMMUNICATION DR:   Sherryll BurgerShah CONCERNING: Antibiotic/ Non-antibiotic IV to Oral Route Change Policy  RECOMMENDATION: This patient is receiving azithromycin and famotidine by the intravenous route.  Based on criteria approved by the Pharmacy and Therapeutics Committee, the antibiotic and non-antibitoic are being converted to the equivalent oral dose forms.   DESCRIPTION: These criteria include:  Patient being treated for a respiratory tract infection, urinary tract infection, cellulitis or clostridium difficile associated diarrhea if on metronidazole  The patient is not neutropenic and does not exhibit a GI malabsorption state  The patient is eating (either orally or via tube) and/or has been taking other orally administered medications for a least 24 hours  The patient is improving clinically and has a Tmax < 100.5  If you have questions about this conversion, please contact the Pharmacy Department   Delsa BernKelly m Fuhrmann, PharmD 11:17 AM 09/18/2016

## 2016-09-18 NOTE — Progress Notes (Signed)
[employer] - [place] at [facility]   PATIENT NAME: Jim [name]    MR#:  [id number]  DATE OF BIRTH:  10/17/1930  SUBJECTIVE:  CHIEF COMPLAINT:   Chief Complaint  Patient presents with  . Fall  . Hypotension  no Bowel movement yet, had agitation last night requiring sitter.  Feels okay right now REVIEW OF SYSTEMS:  Review of Systems  Constitutional: Positive for malaise/fatigue. Negative for chills, fever and weight loss.  HENT: Negative for nosebleeds and sore throat.   Eyes: Negative for blurred vision.  Respiratory: Negative for cough, shortness of breath and wheezing.   Cardiovascular: Negative for chest pain, orthopnea, leg swelling and PND.  Gastrointestinal: Positive for constipation. Negative for abdominal pain, diarrhea, heartburn, nausea and vomiting.  Genitourinary: Negative for dysuria and urgency.  Musculoskeletal: Positive for back pain, falls and myalgias.  Skin: Negative for rash.  Neurological: Positive for weakness. Negative for dizziness, speech change, focal weakness and headaches.  Endo/Heme/Allergies: Does not bruise/bleed easily.  Psychiatric/Behavioral: Negative for depression.   DRUG ALLERGIES:   Allergies  Allergen Reactions  . Penicillins Rash   VITALS:  Blood pressure 123/78, pulse 61, temperature 98.3 F (36.8 C), temperature source Oral, resp. rate 16, height 6\' 1"  (1.854 m), weight 72.8 kg (160 lb 8 oz), SpO2 95 %. PHYSICAL EXAMINATION:  Physical Exam  Constitutional: He is oriented to person, place, and time and well-developed, well-nourished, and in no distress.  HENT:  Head: Normocephalic and atraumatic.  Eyes: Conjunctivae and EOM are normal. Pupils are equal, round, and reactive to light.  Neck: Normal range of motion. Neck supple. No tracheal deviation present. No thyromegaly present.  Cardiovascular: Normal rate, regular rhythm and normal heart sounds.   Pulmonary/Chest: Effort normal and breath sounds normal. No  respiratory distress. He has no wheezes. He exhibits no tenderness.  Abdominal: Soft. Bowel sounds are normal. He exhibits no distension. There is no tenderness.  Musculoskeletal: Normal range of motion.  Lt leg Amputee  Neurological: He is alert and oriented to person, place, and time. No cranial nerve deficit.  Skin: Skin is warm and dry. No rash noted.  Psychiatric: Mood and affect normal.   LABORATORY PANEL:   CBC  Recent Labs Lab 09/18/16 0439  WBC 9.3  HGB 16.3  HCT 47.4  PLT 155   ------------------------------------------------------------------------------------------------------------------ Chemistries   Recent Labs Lab 09/18/16 0439  NA 141  K 3.6  CL 107  CO2 27  GLUCOSE 69  BUN 9  CREATININE 0.74  CALCIUM 8.3*   RADIOLOGY:  Dg Chest 2 View  Result Date: 09/18/2016 CLINICAL DATA:  Shortness of breath.  Diabetic. EXAM: CHEST  2 VIEW COMPARISON:  09/16/2016 FINDINGS: Heart size is enlarged. There is infiltrate within the right lower lobe and right middle lobe consistent with infectious infiltrate. No pulmonary edema. Left lung is clear. IMPRESSION: Persistent right middle lobe and right lower lobe infiltrates. Electronically Signed   By: Norva PavlovElizabeth  Brown M.D.   On: 09/18/2016 08:44   ASSESSMENT AND PLAN:  Jim [name]  is a 82 y.o. male with a known history of Diabetes mellitus, insulin dependent presents to hospital secondary to chills, myalgias and falls at home. Noted to have pneumonia.  #1 sepsis- Present on admission. due to right middle lobe and lower lobe pneumonia. Community-acquired pneumonia. -Blood cultures neg. Cough meds prn -O2 support as needed - wean as tolerated -continue Rocephin and azithromycin. - High procalcitonin confirming bacterial infection, so continue abx - repeat cxr this  morning shows persistent infiltrate  #2 diabetes mellitus-A1c > 11 suggestive of poor control -Takes Lantus 20 units at home. continue sliding scale insulin  and Lantus at bedtime - DM nurse following  #3 Constipation - reports no BM for 10 days - due to chronic opiate use - continue miralax, senokot-s and dulcolax until BM  #4 Peripheral arterial disease Status post left lower extremity amputation and wheelchair bound  #5 Hypertension: noncompliant with meds. Monitor for now.   * Agitation: Likely due to acute delirium while being in the hospital  DVT prophylaxis-Lovenox   D/C telemetry, no need of PT as normally uses wheelchair anyways.  All the records are reviewed and case discussed with Care Management/Social Worker. Management plans discussed with the patient, nursing and they are in agreement.  CODE STATUS: FULL CODE  TOTAL TIME TAKING CARE OF THIS PATIENT: 35 minutes.   More than 50% of the time was spent in counseling/coordination of care: YES  POSSIBLE D/C IN 1-2 DAYS, DEPENDING ON CLINICAL CONDITION.   [facility], Rusty Glodowski M.D on 09/18/2016 at 12:31 PM  Between 7am to 6pm - Pager - [phone]  After 6pm go to [email or web address] - Social research officer, government  [facility] [place] [facility]  Office  [phone]  CC: Primary care physician; Leanna Sato, MD  Note: This dictation was prepared with Dragon dictation along with smaller phrase technology. Any transcriptional errors that result from this process are unintentional.

## 2016-09-18 NOTE — Progress Notes (Signed)
Pts. Trying to get out of bed and fight the staff. Dr. Anne HahnWillis ordered safety sitter.

## 2016-09-19 LAB — GLUCOSE, CAPILLARY
GLUCOSE-CAPILLARY: 104 mg/dL — AB (ref 65–99)
GLUCOSE-CAPILLARY: 109 mg/dL — AB (ref 65–99)
Glucose-Capillary: 82 mg/dL (ref 65–99)
Glucose-Capillary: 97 mg/dL (ref 65–99)

## 2016-09-19 LAB — PROCALCITONIN: Procalcitonin: 0.16 ng/mL

## 2016-09-19 MED ORDER — AZITHROMYCIN 250 MG PO TABS
250.0000 mg | ORAL_TABLET | Freq: Every day | ORAL | Status: AC
  Administered 2016-09-19 – 2016-09-20 (×2): 250 mg via ORAL
  Filled 2016-09-19 (×2): qty 1

## 2016-09-19 MED ORDER — SENNOSIDES-DOCUSATE SODIUM 8.6-50 MG PO TABS
2.0000 | ORAL_TABLET | Freq: Every evening | ORAL | Status: DC | PRN
  Administered 2016-09-20 – 2016-09-21 (×2): 2 via ORAL
  Filled 2016-09-19 (×2): qty 2

## 2016-09-19 NOTE — Care Management Important Message (Signed)
Important Message  Patient Details  Name: Jim [name] MRN: [id number] Date of Birth: Jun 17, 1930   Medicare Important Message Given:  Yes    Marily MemosLisa M Montserrat Shek, RN 09/19/2016, 9:27 AM

## 2016-09-19 NOTE — Progress Notes (Addendum)
[employer] [facility] - [place] at [facility]   PATIENT NAME: Jim [name]    MR#:  [id number]  DATE OF BIRTH:  May 17, 1930  SUBJECTIVE:  CHIEF COMPLAINT:   Chief Complaint  Patient presents with  . Fall  . Hypotension  no Bowel movement yet, continues to have agitation (sun-downing) requiring sitter. Pleasantly confused this am, sugars dropped in 60 last evening and has been in 100s REVIEW OF SYSTEMS:  Review of Systems  Constitutional: Positive for malaise/fatigue. Negative for chills, fever and weight loss.  HENT: Negative for nosebleeds and sore throat.   Eyes: Negative for blurred vision.  Respiratory: Negative for cough, shortness of breath and wheezing.   Cardiovascular: Negative for chest pain, orthopnea, leg swelling and PND.  Gastrointestinal: Positive for constipation. Negative for abdominal pain, diarrhea, heartburn, nausea and vomiting.  Genitourinary: Negative for dysuria and urgency.  Musculoskeletal: Positive for back pain, falls and myalgias.  Skin: Negative for rash.  Neurological: Positive for weakness. Negative for dizziness, speech change, focal weakness and headaches.  Endo/Heme/Allergies: Does not bruise/bleed easily.  Psychiatric/Behavioral: Positive for hallucinations. Negative for depression. The patient is nervous/anxious.    DRUG ALLERGIES:   Allergies  Allergen Reactions  . Penicillins Rash   VITALS:  Blood pressure 108/62, pulse 88, temperature 98.6 F (37 C), temperature source Oral, resp. rate 20, height 6\' 1"  (1.854 m), weight 72.8 kg (160 lb 8 oz), SpO2 94 %. PHYSICAL EXAMINATION:  Physical Exam  Constitutional: He is well-developed, well-nourished, and in no distress.  HENT:  Head: Normocephalic and atraumatic.  Eyes: Conjunctivae and EOM are normal. Pupils are equal, round, and reactive to light.  Neck: Normal range of motion. Neck supple. No tracheal deviation present. No thyromegaly present.  Cardiovascular: Normal rate,  regular rhythm and normal heart sounds.   Pulmonary/Chest: Effort normal and breath sounds normal. No respiratory distress. He has no wheezes. He exhibits no tenderness.  Abdominal: Soft. Bowel sounds are normal. He exhibits no distension. There is no tenderness.  Musculoskeletal: Normal range of motion.  Lt leg Amputee  Neurological: He is agitated and disoriented. No cranial nerve deficit.  Skin: Skin is warm and dry. No rash noted.  Psychiatric: His mood appears anxious. He is agitated.   LABORATORY PANEL:   CBC  Recent Labs Lab 09/18/16 0439  WBC 9.3  HGB 16.3  HCT 47.4  PLT 155   ------------------------------------------------------------------------------------------------------------------ Chemistries   Recent Labs Lab 09/18/16 0439  NA 141  K 3.6  CL 107  CO2 27  GLUCOSE 69  BUN 9  CREATININE 0.74  CALCIUM 8.3*   RADIOLOGY:  No results found. ASSESSMENT AND PLAN:  Jim [name]  is a 82 y.o. male with a known history of Diabetes mellitus, insulin dependent presents to hospital secondary to chills, myalgias and falls at home. Noted to have pneumonia.  * Sepsis- Present on admission. due to right middle lobe and lower lobe pneumonia. Community-acquired pneumonia. -Blood cultures neg. Cough meds prn -O2 support as needed - wean as tolerated - Azithromycin 9/20 >>  - Ceftriaxone 9/19>>   * Hypoglycemia with h/o diabetes mellitus-A1c > 11 suggestive of poor control - for now, hold insulin lantus and monitor. Avoid Hypoglycemia  - DM nurse following  * Constipation - reports no BM for 10 days - due to chronic opiate use - continue miralax, senokot-s and dulcolax until BM  * Agitation: Likely due to acute delirium while being in the hospital - Avoid hypoglycemia - treat pneumonia and constipation -  avoid benzo's  * Peripheral arterial disease Status post left lower extremity amputation and wheelchair bound  * Hypertension: hypotensive at times so hold  off any meds. Monitor for now.   * Stage IV right foot ulcer: present on admission, noted by Nurse. Likely due to his chronic wheelchair bound status and poor mobility   DVT prophylaxis-Lovenox   D/C telemetry, no need of PT as normally uses wheelchair anyways.  All the records are reviewed and case discussed with Care Management/Social Worker. Management plans discussed with the patient, nursing and they are in agreement.  CODE STATUS: FULL CODE  TOTAL TIME TAKING CARE OF THIS PATIENT: 35 minutes.   More than 50% of the time was spent in counseling/coordination of care: YES  POSSIBLE D/C IN 1-2 DAYS, DEPENDING ON CLINICAL CONDITION.   [facility], Jim [name] M.D on 09/19/2016 at 1:48 PM  Between 7am to 6pm - Pager - [phone]  After 6pm go to [email or web address] - Social research officer, governmentpassword EPAS [facility]  [facility] [place] [facility]  Office  [phone]  CC: Primary care physician; Jim [name]  Note: This dictation was prepared with Dragon dictation along with smaller phrase technology. Any transcriptional errors that result from this process are unintentional.

## 2016-09-19 NOTE — Progress Notes (Signed)
Patient was not in his proper mind. But I was about to speak to him and his helper and offered presence and spiritual support for him. He thank me for visiting him.

## 2016-09-19 NOTE — Progress Notes (Signed)
  Pharmacy Antibiotic Note  Jim [name] is a 82 y.o. male admitted on 09/16/2016 with sepsis secondary to  pneumonia.  Pharmacy has been consulted for Ceftriaxone  dosing.  Plan: Continue Rocephin 1 g IV q24 hours.  Patient also on azithromycin 500mg  PO daily, will ask to change to 250mg  PO today.   Day 4 CTX and Day 3 azithromycin- Abx stop dates are ordered  Height: 6\' 1"  (185.4 cm) Weight: 160 lb 8 oz (72.8 kg) IBW/kg (Calculated) : 79.9  Temp (24hrs), Avg:98.8 F (37.1 C), Min:98.1 F (36.7 C), Max:100 F (37.8 C)   Recent Labs Lab 09/16/16 1445 09/17/16 0424 09/18/16 0439  WBC 17.5* 14.0* 9.3  CREATININE  --  0.75 0.74    Estimated Creatinine Clearance: 72 mL/min (by C-G formula based on SCr of 0.74 mg/dL).    Allergies  Allergen Reactions  . Penicillins Rash    Antimicrobials this admission: Azithromycin 9/20 >>   Ceftriaxone 9/19>>   Dose adjustments this admission:   Microbiology results:  9/19: ZOX:[id number]   MRSA PCR: recommend   Thank you for allowing pharmacy to be a part of this patient's care.  Delsa BernKelly m Fuhrmann, PharmD 09/19/2016 8:50 AM

## 2016-09-19 NOTE — Progress Notes (Signed)
Inpatient Diabetes Program Recommendations  AACE/ADA: New Consensus Statement on Inpatient Glycemic Control (2015)  Target Ranges:  Prepandial:   less than 140 mg/dL      Peak postprandial:   less than 180 mg/dL (1-2 hours)      Critically ill patients:  140 - 180 mg/dL   Lab Results  Component Value Date   GLUCAP 104 (H) 09/19/2016   HGBA1C 11.3 (H) 09/16/2016    Review of Glycemic ControlResults for Jim [name], Jim [name] (MRN [id number]) as of 09/19/2016 09:30  Ref. Range 09/18/2016 09:32 09/18/2016 11:10 09/18/2016 17:03 09/18/2016 21:09 09/19/2016 05:46 09/19/2016 07:19  Glucose-Capillary Latest Ref Range: 65 - 99 mg/dL 70 95 68 782130 (H)  956104 (H)    Diabetes history: Type 2 diabetes Outpatient Diabetes medications: Novolog 70/30 45 units daily in the evening (around 8:00-9:00 pm) Current orders for Inpatient glycemic control:  Novolog sensitive tid with meals and HS  Inpatient Diabetes Program Recommendations:  Blood sugars have been much lower in the hospital then A1C indicates for past 2-3 month glycemic control.  Even with lower dose of Lantus 12 units daily, blood sugars are less than goal.  Intake is inconsistent and this morning patient ate nothing.  Agree with d/c of Lantus at this point.  Unclear what patient will need at discharge, however avoidance of hypoglycemia is very important.    Will follow.  Thanks, Beryl MeagerJenny Henrry Feil, RN, BC-ADM Inpatient Diabetes Coordinator Pager [phone] (8a-5p)

## 2016-09-20 LAB — GLUCOSE, CAPILLARY
GLUCOSE-CAPILLARY: 167 mg/dL — AB (ref 65–99)
Glucose-Capillary: 103 mg/dL — ABNORMAL HIGH (ref 65–99)
Glucose-Capillary: 108 mg/dL — ABNORMAL HIGH (ref 65–99)
Glucose-Capillary: 155 mg/dL — ABNORMAL HIGH (ref 65–99)

## 2016-09-20 MED ORDER — OXYCODONE HCL 5 MG PO TABS
7.5000 mg | ORAL_TABLET | Freq: Four times a day (QID) | ORAL | Status: DC | PRN
  Administered 2016-09-20 – 2016-09-22 (×8): 7.5 mg via ORAL
  Filled 2016-09-20 (×8): qty 2

## 2016-09-20 NOTE — Progress Notes (Signed)
Subjective: Complains about right leg pain.  Objective: Vital signs in last 24 hours: Temp:  [98 F (36.7 C)-99.5 F (37.5 C)] 98 F (36.7 C) (09/23 0730) Pulse Rate:  [64-93] 87 (09/23 0730) Resp:  [16-18] 17 (09/23 0730) BP: (112-138)/(45-98) 137/98 (09/23 0730) SpO2:  [92 %-99 %] 99 % (09/23 0730) Weight change:  Last BM Date: 09/18/16  Intake/Output from previous day: No intake/output data recorded. Intake/Output this shift: No intake/output data recorded.  General appearance: alert and cooperative Resp: clear to auscultation bilaterally and normal percussion bilaterally Cardio: regular rate and rhythm, S1, S2 normal, no murmur, click, rub or gallop Extremities: Left Leg BKA. Right leg has large are of escoriated skin. No exudate  Lab Results:  Recent Labs  09/18/16 0439  WBC 9.3  HGB 16.3  HCT 47.4  PLT 155   BMET  Recent Labs  09/18/16 0439  NA 141  K 3.6  CL 107  CO2 27  GLUCOSE 69  BUN 9  CREATININE 0.74  CALCIUM 8.3*    Studies/Results: No results found.  Medications: I have reviewed the patient's current medications.  Assessment/Plan: 1.Sepsis-Resolved. Secondary to pneumonia.  2. Pneumonia: On IV rocephin and zithromax.  3. DM: Had been hypoglycemic but has resolved.  4. Stage IV right foot ulcer: present on admission, noted by Nurse. Likely due to his chronic wheelchair bound status and poor mobility. Keep pressure off area. May need boot when he is up in wheelchair.  Time spent= 20 min   LOS: 4 days   Gracelyn NurseJohnston,  John D 09/20/2016, 10:54 AM

## 2016-09-21 LAB — CULTURE, BLOOD (ROUTINE X 2)
Culture: NO GROWTH
Culture: NO GROWTH

## 2016-09-21 LAB — CREATININE, SERUM: CREATININE: 0.93 mg/dL (ref 0.61–1.24)

## 2016-09-21 LAB — GLUCOSE, CAPILLARY
GLUCOSE-CAPILLARY: 172 mg/dL — AB (ref 65–99)
Glucose-Capillary: 206 mg/dL — ABNORMAL HIGH (ref 65–99)
Glucose-Capillary: 174 mg/dL — ABNORMAL HIGH (ref 65–99)
Glucose-Capillary: 160 mg/dL — ABNORMAL HIGH (ref 65–99)

## 2016-09-21 LAB — PROCALCITONIN: Procalcitonin: <0.1 ng/mL

## 2016-09-21 NOTE — Progress Notes (Signed)
Pt rested quietly throughout shift. Requested pain medication x2. Repositioned q 2hr

## 2016-09-21 NOTE — Progress Notes (Signed)
Subjective: Complains of leg pain  Objective: Vital signs in last 24 hours: Temp:  [98.2 F (36.8 C)-99 F (37.2 C)] 99 F (37.2 C) (09/24 0742) Pulse Rate:  [69-90] 69 (09/24 0742) Resp:  [17-19] 18 (09/24 0742) BP: (114-154)/(55-60) 119/60 (09/24 0742) SpO2:  [92 %-98 %] 94 % (09/24 0742) Weight change:  Last BM Date: 09/20/16  Intake/Output from previous day: 09/23 0701 - 09/24 0700 In: 240 [P.O.:240] Out: -  Intake/Output this shift: No intake/output data recorded.  General appearance: no distress Resp: clear to auscultation bilaterally and normal percussion bilaterally Cardio: regular rate and rhythm, S1, S2 normal, no murmur, click, rub or gallop Extremities: Right leg ulcer.  Lab Results: No results for input(s): WBC, HGB, HCT, PLT in the last 72 hours. BMET  Recent Labs  09/21/16 0355  CREATININE 0.93    Studies/Results: No results found.  Medications: I have reviewed the patient's current medications.  Assessment/Plan: 1.Sepsis-Resolved. Secondary to pneumonia.  2. Pneumonia: On IV rocephin and zithromax. Can change to PO tomorrow.  3. DM: Had been hypoglycemic but has resolved.  4. Stage IV right foot ulcer: present on admission. Likely due to his chronic wheelchair bound status and poor mobility. Keep pressure off area. May need boot when he is up in wheelchair. May need home health nursing for wound care at discharge.  Time spent= 25 min   LOS: 5 days   Gracelyn NurseJohnston,  John D 09/21/2016, 10:16 AM

## 2016-09-22 LAB — GLUCOSE, CAPILLARY
GLUCOSE-CAPILLARY: 212 mg/dL — AB (ref 65–99)
Glucose-Capillary: 320 mg/dL — ABNORMAL HIGH (ref 65–99)
Glucose-Capillary: 223 mg/dL — ABNORMAL HIGH (ref 65–99)
Glucose-Capillary: 337 mg/dL — ABNORMAL HIGH (ref 65–99)

## 2016-09-22 MED ORDER — MAGNESIUM HYDROXIDE 400 MG/5ML PO SUSP
30.0000 mL | Freq: Once | ORAL | Status: AC
  Administered 2016-09-22: 30 mL via ORAL
  Filled 2016-09-22: qty 30

## 2016-09-22 MED ORDER — DOCUSATE SODIUM 100 MG PO CAPS
100.0000 mg | ORAL_CAPSULE | Freq: Two times a day (BID) | ORAL | Status: DC
  Administered 2016-09-22 – 2016-09-23 (×2): 100 mg via ORAL
  Filled 2016-09-22 (×2): qty 1

## 2016-09-22 MED ORDER — OXYCODONE HCL 5 MG PO TABS
7.5000 mg | ORAL_TABLET | ORAL | Status: DC | PRN
  Administered 2016-09-22 – 2016-09-23 (×4): 7.5 mg via ORAL
  Filled 2016-09-22 (×4): qty 2

## 2016-09-22 MED ORDER — INSULIN GLARGINE 100 UNIT/ML ~~LOC~~ SOLN
5.0000 [IU] | Freq: Every day | SUBCUTANEOUS | Status: DC
  Administered 2016-09-22: 5 [IU] via SUBCUTANEOUS
  Filled 2016-09-22 (×2): qty 0.05

## 2016-09-22 NOTE — Care Management Note (Signed)
Case Management Note  Patient Details  Name: Jim [name] MRN: [id number] Date of Birth: 07/02/1930  Subjective/Objective:   RNCM consult for home health needs. Spoke with patient's son, [name],  [phone]. [name] lives in [place], [place]and is here on the weekends. He states the patient has a step son with spina bifida that lives with patient. Patient currently has no home health services. He is wheechair bound. Son agreeable to home health services with no agency preference. [facility] HHA and SW.  No DME needs.             Action/Plan: HH SN, HHA and SW with [facility].   Expected Discharge Date:  09/20/16               Expected Discharge Plan:  Home w Home Health Services  In-House Referral:     Discharge planning Services  CM Consult  Post Acute Care Choice:  Home Health Choice offered to:  Patient  DME Arranged:    DME Agency:     HH Arranged:  RN, Nurse's Aide, Social Work [employer] Agency:  [facility]  Status of Service:  In process, will continue to follow  If discussed at Long Length of Stay Meetings, dates discussed:    Additional Comments:  Marily MemosLisa M Edona Schreffler, RN 09/22/2016, 11:08 AM

## 2016-09-22 NOTE — Progress Notes (Signed)
Inpatient Diabetes Program Recommendations  AACE/ADA: New Consensus Statement on Inpatient Glycemic Control (2015)  Target Ranges:  Prepandial:   less than 140 mg/dL      Peak postprandial:   less than 180 mg/dL (1-2 hours)      Critically ill patients:  140 - 180 mg/dL   Results for Jim [name], Jim [name] (MRN [id number]) as of 09/22/2016 08:33  Ref. Range 09/21/2016 07:44 09/21/2016 11:17 09/21/2016 16:53 09/21/2016 21:29 09/22/2016 07:35  Glucose-Capillary Latest Ref Range: 65 - 99 mg/dL 409206 (H) 811174 (H) 914160 (H) 172 (H) 320 (H)   Review of Glycemic Control  Diabetes history: DM2 Outpatient Diabetes medications: Novolog 70/30 45 units daily in the evening (between 8:00-9:00 pm) Current orders for Inpatient glycemic control: Novolog 0-9 units TID with meals, Novolog 0-5 units QHS  Inpatient Diabetes Program Recommendations: Insulin - Basal: Fasting glucose 320 mg/dl this morning. Please consider ordering low dose basal insulin. Recommend ordering Lantus 5 units Q24H.  Thanks, Orlando PennerMarie Selda Jalbert, RN, MSN, CDE Diabetes Coordinator Inpatient Diabetes Program [phone] (Team Pager from 8am to 5pm) [phone] ([facility] office) [phone] [facility] office) [phone] [facility] office)

## 2016-09-22 NOTE — Consult Note (Signed)
Reason for Consult: Ulceration on the bottom of his right foot Referring Physician: Hospitalist  Jim [name] is an 82 y.o. male.  HPI: Patient relates noticing a sore on the bottom of his right foot that has been present for a couple of weeks. He denies any specific injury or drainage. Consult placed for debridement of the lesion as well as for weightbearing status for physical therapy.  Past Medical History:  Diagnosis Date  . Diabetes mellitus without complication [facility])     Past Surgical History:  Procedure Laterality Date  . LEG AMPUTATION BELOW KNEE  1967    No family history on file.  Social History:  reports that he quit smoking about 20 years ago. His smoking use included Cigarettes. He has never used smokeless tobacco. He reports that he does not drink alcohol or use drugs.  Allergies:  Allergies  Allergen Reactions  . Penicillins Rash    Medications:  Scheduled: . docusate sodium  100 mg Oral BID  . enoxaparin (LOVENOX) injection  40 mg Subcutaneous Q24H  . famotidine  20 mg Oral BID  . insulin aspart  0-5 Units Subcutaneous QHS  . insulin aspart  0-9 Units Subcutaneous TID WC  . insulin glargine  5 Units Subcutaneous QHS  . sodium chloride flush  3 mL Intravenous Q12H    Results for orders placed or performed during the hospital encounter of 09/16/16 (from the past 48 hour(s))  Glucose, capillary     Status: Abnormal   Collection Time: 09/20/16  8:55 PM  Result Value Ref Range   Glucose-Capillary 155 (H) 65 - 99 mg/dL  Procalcitonin     Status: None   Collection Time: 09/21/16  3:55 AM  Result Value Ref Range   Procalcitonin <0.10 ng/mL    Comment:        Interpretation: PCT (Procalcitonin) <= 0.5 ng/mL: Systemic infection (sepsis) is not likely. Local bacterial infection is possible. (NOTE)         ICU PCT Algorithm               Non ICU PCT Algorithm    ----------------------------     ------------------------------         PCT < 0.25 ng/mL                  PCT < 0.1 ng/mL     Stopping of antibiotics            Stopping of antibiotics       strongly encouraged.               strongly encouraged.    ----------------------------     ------------------------------       PCT level decrease by               PCT < 0.25 ng/mL       >= 80% from peak PCT       OR PCT 0.25 - 0.5 ng/mL          Stopping of antibiotics                                             encouraged.     Stopping of antibiotics           encouraged.    ----------------------------     ------------------------------       PCT level  decrease by              PCT >= 0.25 ng/mL       < 80% from peak PCT        AND PCT >= 0.5 ng/mL            Continuin g antibiotics                                              encouraged.       Continuing antibiotics            encouraged.    ----------------------------     ------------------------------     PCT level increase compared          PCT > 0.5 ng/mL         with peak PCT AND          PCT >= 0.5 ng/mL             Escalation of antibiotics                                          strongly encouraged.      Escalation of antibiotics        strongly encouraged.   Creatinine, serum     Status: None   Collection Time: 09/21/16  3:55 AM  Result Value Ref Range   Creatinine, Ser 0.93 0.61 - 1.24 mg/dL   GFR calc non Af Amer >60 >60 mL/min   GFR calc Af Amer >60 >60 mL/min    Comment: (NOTE) The eGFR has been calculated using the CKD EPI equation. This calculation has not been validated in all clinical situations. eGFR's persistently <60 mL/min signify possible Chronic Kidney Disease.   Glucose, capillary     Status: Abnormal   Collection Time: 09/21/16  7:44 AM  Result Value Ref Range   Glucose-Capillary 206 (H) 65 - 99 mg/dL   Comment 1 Notify RN   Glucose, capillary     Status: Abnormal   Collection Time: 09/21/16 11:17 AM  Result Value Ref Range   Glucose-Capillary 174 (H) 65 - 99 mg/dL   Comment 1 Notify RN   Glucose,  capillary     Status: Abnormal   Collection Time: 09/21/16  4:53 PM  Result Value Ref Range   Glucose-Capillary 160 (H) 65 - 99 mg/dL   Comment 1 Notify RN   Glucose, capillary     Status: Abnormal   Collection Time: 09/21/16  9:29 PM  Result Value Ref Range   Glucose-Capillary 172 (H) 65 - 99 mg/dL   Comment 1 Notify RN   Glucose, capillary     Status: Abnormal   Collection Time: 09/22/16  7:35 AM  Result Value Ref Range   Glucose-Capillary 320 (H) 65 - 99 mg/dL  Glucose, capillary     Status: Abnormal   Collection Time: 09/22/16 11:34 AM  Result Value Ref Range   Glucose-Capillary 223 (H) 65 - 99 mg/dL  Glucose, capillary     Status: Abnormal   Collection Time: 09/22/16  4:58 PM  Result Value Ref Range   Glucose-Capillary 337 (H) 65 - 99 mg/dL    No results found.  Review of Systems  Constitutional: Negative for chills and fever.  HENT: Negative.   Eyes: Negative.   Respiratory: Negative.   Cardiovascular: Negative.   Gastrointestinal: Negative.   Genitourinary: Negative.   Musculoskeletal:       Relates previous fixation of his left lower extremity after infection and gangrene related to stepping on a nail  Skin:       Patient relates a sore on the bottom of his right foot that has been present for a couple of weeks. Does not relate any drainage or injury.  Neurological:       Denies numbness or paresthesias in the feet  Endo/Heme/Allergies: Negative.   Psychiatric/Behavioral: Negative.    Blood pressure (!) 161/102, pulse (!) 101, temperature 99.1 F (37.3 C), temperature source Oral, resp. rate 18, height '6\' 1"'  (1.854 m), weight 72.8 kg (160 lb 8 oz), SpO2 94 %. Physical Exam  Cardiovascular:  DP and PT pulses are thready at best on the right foot, not consistently palpable  Musculoskeletal:  Some digital contractures on the right foot. Previous amputation of the left lower extremity  Neurological:  Loss of protective threshold monofilament wire from the ankle  distal to the toes in the right foot  Skin:  The skin is cool dry and atrophic with absent hair growth. Some intact scabs are noted on the dorsal aspect of the right first and second toes. Hemorrhagic lesion is present beneath the right fifth metatarsal with significant surrounding hyperkeratosis. Upon debridement there is a full-thickness ulceration with a granular base and only a minimal amount of purulence beneath the eschar. No deep extension towards the level of the joint. Ulcer measures approximately 6 mm diameter    Assessment/Plan: Assessment: 1. Full-thickness ulceration right fifth metatarsal. 2. Diabetes with associated neuropathy. 3. Probable some degree of vascular compromise  Plan: Excisional debridement of devitalized tissue from the ulceration on the right foot sharply using a 15 blade and tissue nippers including some of the deeper subcutaneous tissue. A sterile saline wet-to-dry dressing was then applied to the ulceration. At this point it does not appear to be infected so I do not think that he requires antibiotics for the ulceration on his foot. Recommend daily wound care with either a sterile saline wet-to-dry dressing or Bactroban and a light gauze dressing. Recommend follow-up outpatient in the office in approximately 2 weeks. Patient may be weightbearing on the right foot with pressure only on the heel to offload the ulceration area on the forefoot. This was also discussed with the patient to keep the pressure only on his heel  Durward Fortes 09/22/2016, 6:29 PM

## 2016-09-22 NOTE — Care Management Important Message (Signed)
Important Message  Patient Details  Name: Jim [name] MRN: [id number] Date of Birth: 11-03-1930   Medicare Important Message Given:  Yes    Marily MemosLisa M Izora Benn, RN 09/22/2016, 9:16 AM

## 2016-09-22 NOTE — Progress Notes (Signed)
PT Cancellation Note  Patient Details Name: Jim [name] MRN: [id number] DOB: 07/19/1930   Cancelled Treatment:    Reason Eval/Treat Not Completed: Medical issues which prohibited therapy (Consult received and chart reviewed.  Per discussion with attending physician this date, concern noted about patient's ability to manage during periods of time home alone and would like PT to assess transfer ability.  PT questioned need for any WBing restrictions of protective footwear for R LE with transfers due to documented stage IV pressure ulcer on plantar surface of R foot (under 5th met head); attending to consult podiatry/wound care for recommendations.  Will hold evaluation at this time until consults completed and appropriate recommendations made.  Will continue efforts as medically appropriate.)   Kristen H. Brown, PT, DPT, NCS 09/22/16, 9:46 AM [phone]    

## 2016-09-22 NOTE — Consult Note (Signed)
WOC Nurse wound consult note Reason for Consult:Neuropathic ulcer to right plantar foot, near great toe metatarsal head.  Is dry and calloused.  May possibly benefit from paring the callous and podiatry has been consulted.  Will defer to podiatry.  Wound type:Neuropathic Pressure Ulcer POA: Yes Measurement: 1 cm x 1 cm unable to visualize wound bed due to the presence of devitalized tissue.  Wound ZHY:QMVHQIONGEXbed:devitalized tissue.  Drainage (amount, consistency, odor) None noted Periwound:intact Dressing procedure/placement/frequency:Will defer to podiatry.  Is dry and intact at this time.  Will not follow at this time.  Please re-consult if needed.  Maple HudsonKaren Mylei Brackeen RN BSN CWON Pager [phone]

## 2016-09-22 NOTE — Progress Notes (Addendum)
Patient ID: Jim [name], male   DOB: [birth year], 82 y.o.   MRN: [id number]   [employer] [facility] [facility] [place] [facility]   PATIENT NAME: Jim [name]    MR#:  [id number]  DATE OF BIRTH:  [birth year]  SUBJECTIVE:  CHIEF COMPLAINT:   Chief Complaint  Patient presents with  . Fall  . Hypotension  Patient seen and examined at bedside. Complains of leg pain and difficulty with transfers. He reports weakness and fatigue but otherwise is improving.  REVIEW OF SYSTEMS:  ROS  CONSTITUTIONAL: No fever/chills,Positive fatigue, weakness, negative weight gain/loss, headache EYES: No blurry or double vision. ENT: No tinnitus, postnasal drip, redness or soreness of the oropharynx. RESPIRATORY: No cough, wheeze, hemoptysis, dyspnea. CARDIOVASCULAR: No chest pain, orthopnea, palpitations, syncope. GASTROINTESTINAL: No nausea, vomiting, constipation, abdominal pain, hematemesis, melena or hematochezia. Positive diarrhea GENITOURINARY: No dysuria or hematuria. ENDOCRINE: No polyuria or nocturia. No heat or cold intolerance. HEMATOLOGY: No anemia, bruising, bleeding. INTEGUMENTARY: No rashes, ulcers, lesions. MUSCULOSKELETAL: No arthritis, swelling, gout. Positive leg pain.  NEUROLOGIC: No numbness, tingling, weakness or ataxia. No seizure-type activity. PSYCHIATRIC: No anxiety, depression, insomnia.   DRUG ALLERGIES:   Allergies  Allergen Reactions  . Penicillins Rash   VITALS:  Blood pressure (!) 142/72, pulse 85, temperature 97.9 F (36.6 C), temperature source Oral, resp. rate 17, height 6\' 1"  (1.854 m), weight 72.8 kg (160 lb 8 oz), SpO2 96 %. PHYSICAL EXAMINATION:  Physical Exam  PHYSICAL EXAMINATION:  GENERAL: 82 y.o.-year-old white male patient, well-developed, well-nourished lying in the bed in no acute distress.   HEENT: Head atraumatic, normocephalic. Pupils equal, round, reactive to light and accommodation. No scleral icterus. Extraocular muscles intact. Nares are  patent. Oropharynx is clear. Mucus membranes moist. NECK: Supple, full range of motion. No JVD, no bruit heard. No thyroid enlargement, no tenderness, no cervical lymphadenopathy. CHEST: Normal breath sounds bilaterally. No wheezing, rales, rhonchi or crackles. No use of accessory muscles of respiration.  No reproducible chest wall tenderness.  CARDIOVASCULAR: S1, S2 normal. No murmurs, rubs, or gallops. Cap refill <2 seconds. ABDOMEN: Soft, nontender, nondistended. No rebound, guarding, rigidity. Normoactive bowel sounds present in all four quadrants. No organomegaly or mass. EXTREMITIES: Full range of motion. No pedal edema, cyanosis, or clubbing. L BKA.  NEUROLOGIC: Cranial nerves II through XII are grossly intact with no focal sensorimotor deficit. Muscle strength 5/5 in all extremities. Sensation intact. Gait not checked. PSYCHIATRIC: The patient is alert and oriented x 3. Normal affect, mood, thought content. SKIN: Warm, dry, and intact without obvious rash, lesion, or ulcer with the exception of stage IV pressure ulcer on plantar aspect right foot.  LABORATORY PANEL:   CBC  Recent Labs Lab 09/18/16 0439  WBC 9.3  HGB 16.3  HCT 47.4  PLT 155   ------------------------------------------------------------------------------------------------------------------ Chemistries   Recent Labs Lab 09/18/16 0439 09/21/16 0355  NA 141  --   K 3.6  --   CL 107  --   CO2 27  --   GLUCOSE 69  --   BUN 9  --   CREATININE 0.74 0.93  CALCIUM 8.3*  --    RADIOLOGY:  No results found. ASSESSMENT AND PLAN:   Medications: I have reviewed the patient's current medications.  Assessment/Plan: 1.Sepsis-Resolved. Secondary to pneumonia.  2. Pneumonia: Continue IV rocephin and zithromax.  3. DM: Blood sugar Variable. We will start Lantus 5 units subcutaneous daily at bedtime.  4. Stage IV right foot ulcer: present on admission, noted by Nurse.  Likely due to his chronic wheelchair bound  status and poor mobility. Keep pressure off area. May need boot when he is up in wheelchair. Discussed wound with physical therapy and we agreed to pursue podiatry and wound care consultation for consideration of further treatment and equipment needs. PT will reevaluate following podiatry consultation.   5. Constipation-milk of magnesia ordered 1. We'll start bowel regimen with Colace for chronic constipation.  Discharge planning for AM pending podiatry, wound care and home care evaluation.  Time spent= 20 min   LOS: 5 days    All the records are reviewed and case discussed with Care Management/Social Worker. Management plans discussed with the patient, family and they are in agreement.  CODE STATUS: Full  TOTAL TIME TAKING CARE OF THIS PATIENT: 20 minutes.   More than 50% of the time was spent in counseling/coordination of care: YES  POSSIBLE D/C IN 1 DAYS, DEPENDING ON CLINICAL CONDITION.   Jim [name] M.D on 09/22/2016 at 12:01 PM  Between 7am to 6pm - Pager - [phone]  After 6pm go to [email or web address] - Social research officer, government  [employer] [facility]  Office  [phone]  CC: Primary care physician; Leanna Sato, MD  Note: This dictation was prepared with Dragon dictation along with smaller phrase technology. Any transcriptional errors that result from this process are unintentional.

## 2016-09-23 LAB — CBC
HEMATOCRIT: 44.5 % (ref 40.0–52.0)
Hemoglobin: 15.3 g/dL (ref 13.0–18.0)
MCH: 29.1 pg (ref 26.0–34.0)
MCHC: 34.3 g/dL (ref 32.0–36.0)
MCV: 84.9 fL (ref 80.0–100.0)
PLATELETS: 161 10*3/uL (ref 150–440)
RBC: 5.25 MIL/uL (ref 4.40–5.90)
RDW: 15.7 % — ABNORMAL HIGH (ref 11.5–14.5)
WBC: 10.1 10*3/uL (ref 3.8–10.6)

## 2016-09-23 LAB — GLUCOSE, CAPILLARY
Glucose-Capillary: 203 mg/dL — ABNORMAL HIGH (ref 65–99)
Glucose-Capillary: 183 mg/dL — ABNORMAL HIGH (ref 65–99)

## 2016-09-23 MED ORDER — INSULIN GLARGINE 100 UNIT/ML ~~LOC~~ SOLN: 5.0000 [IU] | Freq: Every day | SUBCUTANEOUS | 11 refills | Status: DC

## 2016-09-23 NOTE — Care Management (Signed)
Patient will be transported home by car

## 2016-09-23 NOTE — Progress Notes (Signed)
Inpatient Diabetes Program Recommendations  AACE/ADA: New Consensus Statement on Inpatient Glycemic Control (2015)  Target Ranges:  Prepandial:   less than 140 mg/dL      Peak postprandial:   less than 180 mg/dL (1-2 hours)      Critically ill patients:  140 - 180 mg/dL  Results for Jim [name], Jim [name] (MRN [id number]) as of 09/23/2016 11:13  Ref. Range 09/22/2016 07:35 09/22/2016 11:34 09/22/2016 16:58 09/22/2016 21:31 09/23/2016 07:25  Glucose-Capillary Latest Ref Range: 65 - 99 mg/dL 962320 (H) 952223 (H) 841337 (H) 212 (H) 203 (H)    Review of Glycemic Control Diabetes history: DM2 Outpatient Diabetes medications: Novolog 70/30 45 units daily in the evening (between 8:00-9:00 pm) Current orders for Inpatient glycemic control:Lantus 5 units QHS, Novolog 0-9 units TID with meals, Novolog 0-5 units QHS  Inpatient Diabetes Program Recommendations: Insulin - Basal: Fasting glucose 203 mg/dl this morning. Please consider increasing Lantus to 8 units QHS.  Thanks, Orlando PennerMarie Osmany Azer, RN, MSN, CDE Diabetes Coordinator Inpatient Diabetes Program [phone] (Team Pager from 8am to 5pm) [phone] ([facility] office) [phone] [facility] office) [phone] [facility] office)

## 2016-09-23 NOTE — Progress Notes (Signed)
PT Cancellation Note  Patient Details Name: Jim [name] MRN: [id number] DOB: May 09, 1930   Cancelled Treatment:     Attempted PT eval with pt refusing x 2 secondary to "feeling in rough shape".  Unable to obtain more detail on why pt would not participate in therapy.  Pt stated "I'll try tomorrow but don't come back today."     D. Scott Suraj Ramdass PT, DPT 09/23/16, 11:30 AM

## 2016-09-23 NOTE — Care Management (Signed)
Medical necessity completed for EMS transport home, son he can not take patient by car. Primary nurse updated.

## 2016-09-24 ENCOUNTER — Emergency Department: Payer: Medicare HMO

## 2016-09-24 ENCOUNTER — Encounter: Payer: Self-pay | Admitting: Emergency Medicine

## 2016-09-24 ENCOUNTER — Inpatient Hospital Stay
Admission: EM | Admit: 2016-09-24 | Discharge: 2016-09-26 | DRG: 194 | Disposition: A | Discharge status: Skilled Nursing Facility | Payer: Medicare HMO | Attending: Internal Medicine | Admitting: Internal Medicine

## 2016-09-24 DIAGNOSIS — E119 Type 2 diabetes mellitus without complications: Secondary | ICD-10-CM | POA: Diagnosis present

## 2016-09-24 DIAGNOSIS — Y95 Nosocomial condition: Secondary | ICD-10-CM | POA: Diagnosis present

## 2016-09-24 DIAGNOSIS — Z89511 Acquired absence of right leg below knee: Secondary | ICD-10-CM

## 2016-09-24 DIAGNOSIS — J189 Pneumonia, unspecified organism: Principal | ICD-10-CM | POA: Diagnosis present

## 2016-09-24 DIAGNOSIS — I119 Hypertensive heart disease without heart failure: Secondary | ICD-10-CM | POA: Diagnosis present

## 2016-09-24 DIAGNOSIS — K567 Ileus, unspecified: Secondary | ICD-10-CM | POA: Diagnosis not present

## 2016-09-24 DIAGNOSIS — R778 Other specified abnormalities of plasma proteins: Secondary | ICD-10-CM

## 2016-09-24 DIAGNOSIS — I248 Other forms of acute ischemic heart disease: Secondary | ICD-10-CM | POA: Diagnosis present

## 2016-09-24 DIAGNOSIS — Z794 Long term (current) use of insulin: Secondary | ICD-10-CM | POA: Diagnosis not present

## 2016-09-24 DIAGNOSIS — Z87891 Personal history of nicotine dependence: Secondary | ICD-10-CM | POA: Diagnosis not present

## 2016-09-24 DIAGNOSIS — R7989 Other specified abnormal findings of blood chemistry: Secondary | ICD-10-CM

## 2016-09-24 DIAGNOSIS — Z880 Allergy status to penicillin: Secondary | ICD-10-CM

## 2016-09-24 DIAGNOSIS — R0602 Shortness of breath: Secondary | ICD-10-CM

## 2016-09-24 DIAGNOSIS — R262 Difficulty in walking, not elsewhere classified: Secondary | ICD-10-CM

## 2016-09-24 DIAGNOSIS — M6281 Muscle weakness (generalized): Secondary | ICD-10-CM

## 2016-09-24 HISTORY — DX: Non-pressure chronic ulcer of other part of right foot with unspecified severity: L97.519

## 2016-09-24 HISTORY — DX: Essential (primary) hypertension: I10

## 2016-09-24 LAB — CBC WITH DIFFERENTIAL/PLATELET
BASOS ABS: 0 10*3/uL (ref 0–0.1)
Basophils Relative: 0 %
Eosinophils Absolute: 0.1 10*3/uL (ref 0–0.7)
Eosinophils Relative: 1 %
HCT: 45.7 % (ref 40.0–52.0)
HEMOGLOBIN: 15.6 g/dL (ref 13.0–18.0)
LYMPHS ABS: 1.1 10*3/uL (ref 1.0–3.6)
LYMPHS PCT: 11 %
MCH: 29 pg (ref 26.0–34.0)
MCHC: 34.2 g/dL (ref 32.0–36.0)
MCV: 84.7 fL (ref 80.0–100.0)
Monocytes Absolute: 0.9 10*3/uL (ref 0.2–1.0)
Monocytes Relative: 10 %
NEUTROS ABS: 7.7 10*3/uL — AB (ref 1.4–6.5)
NEUTROS PCT: 78 %
PLATELETS: 193 10*3/uL (ref 150–440)
RBC: 5.39 MIL/uL (ref 4.40–5.90)
RDW: 15.9 % — ABNORMAL HIGH (ref 11.5–14.5)
WBC: 9.8 10*3/uL (ref 3.8–10.6)

## 2016-09-24 LAB — LIPASE, BLOOD: LIPASE: 13 U/L (ref 11–51)

## 2016-09-24 LAB — CREATININE, SERUM
Creatinine, Ser: 1.14 mg/dL (ref 0.61–1.24)
GFR, EST NON AFRICAN AMERICAN: 58 mL/min — AB (ref >60)

## 2016-09-24 LAB — COMPREHENSIVE METABOLIC PANEL
ALK PHOS: 65 U/L (ref 38–126)
ALT: 9 U/L — ABNORMAL LOW (ref 17–63)
ANION GAP: 8 (ref 5–15)
AST: 12 U/L — ABNORMAL LOW (ref 15–41)
Albumin: 2.4 g/dL — ABNORMAL LOW (ref 3.5–5.0)
BILIRUBIN TOTAL: 1.1 mg/dL (ref 0.3–1.2)
BUN: 13 mg/dL (ref 6–20)
CALCIUM: 8 mg/dL — AB (ref 8.9–10.3)
CO2: 23 mmol/L (ref 22–32)
Chloride: 108 mmol/L (ref 101–111)
Creatinine, Ser: 1.09 mg/dL (ref 0.61–1.24)
GFR calc non Af Amer: >60 mL/min (ref >60)
GLUCOSE: 270 mg/dL — AB (ref 65–99)
POTASSIUM: 3.9 mmol/L (ref 3.5–5.1)
Sodium: 139 mmol/L (ref 135–145)
TOTAL PROTEIN: 5.7 g/dL — AB (ref 6.5–8.1)

## 2016-09-24 LAB — GLUCOSE, CAPILLARY
GLUCOSE-CAPILLARY: 221 mg/dL — AB (ref 65–99)
Glucose-Capillary: 275 mg/dL — ABNORMAL HIGH (ref 65–99)

## 2016-09-24 LAB — MRSA PCR SCREENING: MRSA by PCR: NEGATIVE

## 2016-09-24 LAB — TROPONIN I
Troponin I: 0.06 ng/mL (ref <0.03)
Troponin I: 0.07 ng/mL (ref <0.03)

## 2016-09-24 MED ORDER — OXYCODONE HCL 5 MG PO TABS
7.5000 mg | ORAL_TABLET | ORAL | Status: DC | PRN
  Administered 2016-09-24 – 2016-09-26 (×9): 7.5 mg via ORAL
  Filled 2016-09-24 (×9): qty 2

## 2016-09-24 MED ORDER — INSULIN ASPART 100 UNIT/ML ~~LOC~~ SOLN
0.0000 [IU] | Freq: Every day | SUBCUTANEOUS | Status: DC
  Administered 2016-09-24: 2 [IU] via SUBCUTANEOUS
  Filled 2016-09-24: qty 2

## 2016-09-24 MED ORDER — LEVOFLOXACIN IN D5W 750 MG/150ML IV SOLN
750.0000 mg | Freq: Once | INTRAVENOUS | Status: AC
  Administered 2016-09-24: 750 mg via INTRAVENOUS
  Filled 2016-09-24: qty 150

## 2016-09-24 MED ORDER — SODIUM CHLORIDE 0.9 % IV SOLN: 250.0000 mL | INTRAVENOUS | Status: DC | PRN

## 2016-09-24 MED ORDER — PIPERACILLIN-TAZOBACTAM 3.375 G IVPB 30 MIN
3.3750 g | Freq: Once | INTRAVENOUS | Status: AC
Start: 2016-09-24 — End: 2016-09-24
  Administered 2016-09-24: 3.375 g via INTRAVENOUS

## 2016-09-24 MED ORDER — SODIUM CHLORIDE 0.9% FLUSH: 3.0000 mL | INTRAVENOUS | Status: DC | PRN

## 2016-09-24 MED ORDER — INSULIN ASPART 100 UNIT/ML ~~LOC~~ SOLN
0.0000 [IU] | Freq: Three times a day (TID) | SUBCUTANEOUS | Status: DC
  Administered 2016-09-24: 5 [IU] via SUBCUTANEOUS
  Administered 2016-09-25 – 2016-09-26 (×3): 2 [IU] via SUBCUTANEOUS
  Filled 2016-09-24 (×2): qty 2
  Filled 2016-09-24: qty 5
  Filled 2016-09-24: qty 2

## 2016-09-24 MED ORDER — SODIUM CHLORIDE 0.9 % IV SOLN
1000.0000 mL | Freq: Once | INTRAVENOUS | Status: AC
  Administered 2016-09-24: 1000 mL via INTRAVENOUS

## 2016-09-24 MED ORDER — ASPIRIN 325 MG PO TABS
325.0000 mg | ORAL_TABLET | Freq: Every day | ORAL | Status: DC
  Administered 2016-09-24 – 2016-09-26 (×3): 325 mg via ORAL
  Filled 2016-09-24 (×3): qty 1

## 2016-09-24 MED ORDER — SODIUM CHLORIDE 0.9% FLUSH
3.0000 mL | Freq: Two times a day (BID) | INTRAVENOUS | Status: DC
  Administered 2016-09-25 – 2016-09-26 (×3): 3 mL via INTRAVENOUS

## 2016-09-24 MED ORDER — ENOXAPARIN SODIUM 40 MG/0.4ML ~~LOC~~ SOLN
40.0000 mg | SUBCUTANEOUS | Status: DC
  Administered 2016-09-24 – 2016-09-25 (×2): 40 mg via SUBCUTANEOUS
  Filled 2016-09-24 (×2): qty 0.4

## 2016-09-24 MED ORDER — INSULIN ASPART PROT & ASPART (70-30 MIX) 100 UNIT/ML ~~LOC~~ SUSP
45.0000 [IU] | Freq: Every day | SUBCUTANEOUS | Status: DC
  Administered 2016-09-24: 45 [IU] via SUBCUTANEOUS
  Filled 2016-09-24: qty 45

## 2016-09-24 MED ORDER — DEXTROSE 5 % IV SOLN
2.0000 g | Freq: Three times a day (TID) | INTRAVENOUS | Status: DC
  Administered 2016-09-24 – 2016-09-25 (×3): 2 g via INTRAVENOUS
  Filled 2016-09-24 (×4): qty 2

## 2016-09-24 MED ORDER — PIPERACILLIN-TAZOBACTAM 3.375 G IVPB
3.3750 g | Freq: Once | INTRAVENOUS | Status: DC
  Filled 2016-09-24: qty 50

## 2016-09-24 MED ORDER — OXYCODONE HCL 5 MG PO TABS
7.5000 mg | ORAL_TABLET | Freq: Four times a day (QID) | ORAL | Status: DC | PRN
  Administered 2016-09-24: 7.5 mg via ORAL
  Filled 2016-09-24: qty 2

## 2016-09-24 MED ORDER — VANCOMYCIN HCL 10 G IV SOLR
1250.0000 mg | INTRAVENOUS | Status: DC
  Administered 2016-09-24 – 2016-09-25 (×2): 1250 mg via INTRAVENOUS
  Filled 2016-09-24 (×3): qty 1250

## 2016-09-24 MED ORDER — VANCOMYCIN HCL IN DEXTROSE 1-5 GM/200ML-% IV SOLN
1000.0000 mg | Freq: Once | INTRAVENOUS | Status: AC
  Administered 2016-09-24: 1000 mg via INTRAVENOUS
  Filled 2016-09-24: qty 200

## 2016-09-24 NOTE — Progress Notes (Signed)
Pharmacy Antibiotic Note  Andrea L Shela NevinCoble is a 82 y.o. male admitted on 09/24/2016 with pneumonia.  Pharmacy has been consulted for vancomycin and aztreonam dosing.  Patient was recently discharged from hospital after being treated with ceftriaxone and azithromycin.   Plan: Aztreonam 2 g IV q8h  Vancomycin 1000 mg dose in ED Vancomycin 1250 mg IV q24h (starting @ 2000 tonight - 6 hour stacked dose) Goal vancomycin trough 15-20 mcg/mL Vancomycin trough scheduled for 9/30 @ 1930  MRSA PCR ordered   Height: 6\' 1"  (185.4 cm) Weight: 160 lb 8 oz (72.8 kg) IBW/kg (Calculated) : 79.9  Temp (24hrs), Avg:98 F (36.7 C), Min:98 F (36.7 C), Max:98 F (36.7 C)   Recent Labs Lab 09/18/16 0439 09/21/16 0355 09/23/16 1132 09/24/16 1132  WBC 9.3  --  10.1 9.8  CREATININE 0.74 0.93  --  1.09    Estimated Creatinine Clearance: 52.9 mL/min (by C-G formula based on SCr of 1.09 mg/dL).    Allergies  Allergen Reactions  . Penicillins Rash   Antimicrobials this admission: vancomycin 9/27 >>  aztreonam 9/27 >>   Dose adjustments this admission:  Microbiology results: 9/27 BCx: Sent 9/27 Sputum: Sent  9/27 MRSA PCR: Sent  Thank you for allowing pharmacy to be a part of this patient's care.  Cindi CarbonMary M Elani Delph, PharmD Clinical Pharmacist 09/24/2016 5:17 PM

## 2016-09-24 NOTE — H&P (Signed)
[facility] [place] [facility]   PATIENT NAME: Jim [name]    MR#:  [id number]  DATE OF BIRTH:  10/16/1930  DATE OF ADMISSION:  09/24/2016  PRIMARY CARE PHYSICIAN: Jim [name],Jim M, MD   REQUESTING/REFERRING PHYSICIAN: Emily FilbertJonathan E Williams, MD  CHIEF COMPLAINT:   Chief Complaint  Patient presents with  . Weakness   Worsening weakness since discharge yesterday. HISTORY OF PRESENT ILLNESS:  Jim [name]  is a 82 y.o. male with a known history of Recent sepsis with pneumonia, hypertension and diabetes. The patient was recently admitted for pneumonia and was discharged yesterday. Hecomplained of worsening weakness since discharge yesterday. But he denies any fever or chills, shortness of breath or wheezing. Chest x-ray show worsening right-sided pneumonia. ED physician request for admission.  PAST MEDICAL HISTORY:   Past Medical History:  Diagnosis Date  . Diabetes mellitus without complication (HCC)   . Hypertension   . Right foot ulcer (HCC)     PAST SURGICAL HISTORY:   Past Surgical History:  Procedure Laterality Date  . LEG AMPUTATION BELOW KNEE  1967   RIGHT    SOCIAL HISTORY:   Social History  Substance Use Topics  . Smoking status: Former Smoker    Types: Cigarettes    Quit date: 12/30/1995  . Smokeless tobacco: Never Used  . Alcohol use No    FAMILY HISTORY:  History reviewed. No pertinent family history. He doesn't know.  DRUG ALLERGIES:   Allergies  Allergen Reactions  . Penicillins Rash    REVIEW OF SYSTEMS:   Review of Systems  Constitutional: Positive for chills and malaise/fatigue. Negative for fever.  HENT: Negative for sore throat.   Respiratory: Negative for cough, hemoptysis, sputum production, shortness of breath, wheezing and stridor.   Cardiovascular: Negative for chest pain, palpitations and leg swelling.  Gastrointestinal: Negative for blood in stool, constipation, nausea and vomiting.  Genitourinary: Negative  for dysuria and urgency.  Musculoskeletal: Negative for joint pain.  Skin: Negative for itching and rash.  Neurological: Positive for weakness. Negative for dizziness, seizures and loss of consciousness.  Psychiatric/Behavioral: The patient is not nervous/anxious.     MEDICATIONS AT HOME:   Prior to Admission medications   Medication Sig Start Date End Date Taking? Authorizing Provider  insulin aspart protamine- aspart (NOVOLOG MIX 70/30) (70-30) 100 UNIT/ML injection Inject 45 Units into the skin daily with supper. Patient's daughter call pharmacy to confirm Novolog 70/30   Yes Historical Provider, MD  LANTUS 100 UNIT/ML injection Inject 5 Units into the skin daily. 09/23/16  Yes Historical Provider, MD  oxycodone (ROXICODONE) 30 MG immediate release tablet Take 7.5 mg by mouth every 6 (six) hours as needed for pain.   Yes Historical Provider, MD      VITAL SIGNS:  Blood pressure 127/81, pulse 94, temperature 98 F (36.7 C), temperature source Oral, resp. rate 20, height 6\' 1"  (1.854 [name]), weight 160 lb 8 oz (72.8 kg), SpO2 97 %.  PHYSICAL EXAMINATION:  Physical Exam  GENERAL:  82 y.o.-year-old patient lying in the bed with no acute distress.  EYES: Pupils equal, round, reactive to light and accommodation. No scleral icterus. Extraocular muscles intact.  HEENT: Head atraumatic, normocephalic. Oropharynx and nasopharynx clear.  NECK:  Supple, no jugular venous distention. No thyroid enlargement, no tenderness.  LUNGS: Diminished breath sounds on right side, no wheezing, rales,rhonchi or crepitation. No use of accessory muscles of respiration.  CARDIOVASCULAR: S1, S2 normal. No murmurs, rubs, or gallops.  ABDOMEN: Soft,  nontender, nondistended. Bowel sounds present. No organomegaly or mass.  EXTREMITIES: No pedal edema, cyanosis, or clubbing. Left BKA. NEUROLOGIC: Cranial nerves II through XII are intact. Muscle strength 3/5 in all extremities. Sensation intact. Gait not checked.    PSYCHIATRIC: The patient is alert and oriented x 3.  SKIN: No obvious rash, lesion, or ulcer.   LABORATORY PANEL:   CBC  Recent Labs Lab 09/24/16 1132  WBC 9.8  HGB 15.6  HCT 45.7  PLT 193   ------------------------------------------------------------------------------------------------------------------  Chemistries   Recent Labs Lab 09/24/16 1132  NA 139  K 3.9  CL 108  CO2 23  GLUCOSE 270*  BUN 13  CREATININE 1.09  CALCIUM 8.0*  AST 12*  ALT 9*  ALKPHOS 65  BILITOT 1.1   ------------------------------------------------------------------------------------------------------------------  Cardiac Enzymes  Recent Labs Lab 09/24/16 1132  TROPONINI 0.06*   ------------------------------------------------------------------------------------------------------------------  RADIOLOGY:  Dg Abdomen Acute W/chest  Result Date: 09/24/2016 CLINICAL DATA:  Weakness, abdominal pain EXAM: DG ABDOMEN ACUTE W/ 1V CHEST COMPARISON:  09/18/2016 FINDINGS: Cardiomediastinal silhouette is stable. There is worsening pneumonia in right lower lobe and right middle lobe. No pulmonary edema. Abundant stool noted in right colon. Moderate gas noted in splenic flexure of the colon and proximal left colon. Mild gaseous distended small bowel loops mid abdomen suspicious for ileus or Ison bowel obstruction. Paucity of bowel gas within pelvis. No evidence of free abdominal air. IMPRESSION: Worsening pneumonia/infiltrate in right lower lobe and right middle lobe. No pulmonary edema. Abundant stool noted in right colon. Moderate gas noted in splenic flexure of the colon. Mild gaseous distended small bowel loops mid abdomen suspicious for ileus or Abdirahim bowel obstruction. Paucity of bowel gas within pelvis. Electronically Signed   By: Jim [name] [name].D.   On: 09/24/2016 12:56      IMPRESSION AND PLAN:   Worsening pneumonia (HAP) Start Azactam and vancomycin. Follow-up CBC and blood  culture.  Elevated troponin. Possibly due to demanding ischemia.  Start aspirin and a follow-up troponin level.  Hypertension. Blood pressure is under control without medication.  Diabetes. Start a sliding scale and continue NovoLog 70/30.  All the records are reviewed and case discussed with ED provider. Management plans discussed with the patient, family and they are in agreement.  CODE STATUS: full code.  TOTAL TIME TAKING CARE OF THIS PATIENT: 55 minutes.    Shaune Pollack [name].D on 09/24/2016 at 4:04 PM  Between 7am to 6pm - Pager - [phone]  After 6pm go to [email or web address] - Social research officer, government  [facility]  Office  [phone]  CC: Primary care physician; Jim Sato, MD   Note: This dictation was prepared with Dragon dictation along with smaller phrase technology. Any transcriptional errors that result from this process are unintentional.

## 2016-09-24 NOTE — ED Triage Notes (Addendum)
Pt presents to ED via EMS from home c/o weakness. Discharged from 7day hospital admission yesterday. Per EMS pt lives at home with one son who is also bedbound, another son checks in on him once a week. Pt states the hospital staff thought he had refused rehab facility placement post discharge but he did not. Multiple skin issues present: scabs scattered to RLE with bruising, swelling to BUE, and L BKA.

## 2016-09-24 NOTE — Progress Notes (Signed)
CSW received consult for possible placement. CSW attempted to engage with pt at pt's bedside. Pt was asleep and awoke briefly to say "come back later". CSW will try to meet with pt again later when pt is more appropriate.   Jonathon JordanLynn B Terrye Dombrosky, MSW, Theresia MajorsLCSWA [phone]

## 2016-09-24 NOTE — ED Provider Notes (Signed)
[facility] Emergency Department Provider Note        Time seen: ----------------------------------------- 11:31 AM on 09/24/2016 -----------------------------------------    I have reviewed the triage vital signs and the nursing notes.   HISTORY  Chief Complaint Weakness    HPI Jim [name] is a 82 y.o. male who presents to ER complaining of weakness. He is brought by EMS from home. He was reportedly just discharged from the hospital yesterday after a 7 day admission. EMS states patient lives at home with one son who is also bed bound. the son checks on him once a week. patient states the hospital staff thought he had refused rehabilitation but he states this was not true. patient has a history of right below the knee amputation. He has had some abdominal pain but otherwise denies complaints.   Past Medical History:  Diagnosis Date  . Diabetes mellitus without complication [facility])     Patient Active Problem List   Diagnosis Date Noted  . Pressure injury of skin 09/17/2016  . Sepsis (HCC) 09/16/2016    Past Surgical History:  Procedure Laterality Date  . LEG AMPUTATION BELOW KNEE  1967   RIGHT    Allergies Penicillins  Social History Social History  Substance Use Topics  . Smoking status: Former Smoker    Types: Cigarettes    Quit date: 12/30/1995  . Smokeless tobacco: Never Used  . Alcohol use No    Review of Systems Constitutional: Negative for fever. Cardiovascular: Negative for chest pain. Respiratory: Negative for shortness of breath. Gastrointestinal: Positive for abdominal pain, negative for vomiting and diarrhea Genitourinary: Negative for dysuria. Musculoskeletal: Negative for back pain. Skin: Positive for wound on the left foot and abrasions Neurological: Negative for headaches, positive for generalized weakness  10-point ROS otherwise negative.  ____________________________________________   PHYSICAL EXAM:  VITAL  SIGNS: ED Triage Vitals  Enc Vitals Group     BP 09/24/16 1128 122/73     Pulse Rate 09/24/16 1128 88     Resp 09/24/16 1128 20     Temp 09/24/16 1128 98 F (36.7 C)     Temp Source 09/24/16 1128 Oral     SpO2 09/24/16 1128 93 %     Weight 09/24/16 1129 160 lb 8 oz (72.8 kg)     Height 09/24/16 1129 6\' 1"  (1.854 m)     Head Circumference --      Peak Flow --      Pain Score 09/24/16 1129 0     Pain Loc --      Pain Edu? --      Excl. in GC? --     Constitutional: Alert and oriented. No acute distress Eyes: Conjunctivae are normal. PERRL. Normal extraocular movements. ENT   Head: Normocephalic and atraumatic.   Nose: No congestion/rhinnorhea.   Mouth/Throat: Mucous membranes are moist.   Neck: No stridor. Cardiovascular: Normal rate, regular rhythm. No murmurs, rubs, or gallops. Respiratory: Normal respiratory effort without tachypnea nor retractions. Breath sounds are clear and equal bilaterally. No wheezes/rales/rhonchi. Gastrointestinal: Soft, distended, hypoactive bowel sounds Musculoskeletal: Nontender with normal range of motion in all extremities. Right BKA, diminished pulses in the left foot. Wound noted to the left great toe Neurologic:  Normal speech and language. No gross focal neurologic deficits are appreciated.  Skin:  Skin is warm, dry with scattered abrasions, wound noted to the left great toe Psychiatric: Mood and affect are normal. Speech and behavior are normal.  ____________________________________________  EKG: Interpreted  by me. Sinus tachycardia with a rate of 101 bpm, wide QRS, right bundle branch block, LVH, left axis deviation  ____________________________________________  ED COURSE:  Pertinent labs & imaging results that were available during my care of the patient were reviewed by me and considered in my medical decision making (see chart for details). Clinical Course  Patient presents to ER with weakness. We will assess with basic  labs, give IV fluid and reevaluate.  Procedures ____________________________________________   LABS (pertinent positives/negatives)  Labs Reviewed  CBC WITH DIFFERENTIAL/PLATELET - Abnormal; Notable for the following:       Result Value   RDW 15.9 (*)    Neutro Abs 7.7 (*)    All other components within normal limits  COMPREHENSIVE METABOLIC PANEL - Abnormal; Notable for the following:    Glucose, Bld 270 (*)    Calcium 8.0 (*)    Total Protein 5.7 (*)    Albumin 2.4 (*)    AST 12 (*)    ALT 9 (*)    All other components within normal limits  TROPONIN I - Abnormal; Notable for the following:    Troponin I 0.06 (*)    All other components within normal limits  LIPASE, BLOOD  URINALYSIS COMPLETEWITH MICROSCOPIC ([facility] ONLY)    RADIOLOGY Images were viewed by me  Acute abdominal series IMPRESSION: Worsening pneumonia/infiltrate in right lower lobe and right middle lobe. No pulmonary edema. Abundant stool noted in right colon. Moderate gas noted in splenic flexure of the colon. Mild gaseous distended small bowel loops mid abdomen suspicious for ileus or Deon bowel obstruction. Paucity of bowel gas within pelvis.  ____________________________________________  FINAL ASSESSMENT AND PLAN  Weakness, Pneumonia, ileus, elevated troponin level  Plan: Patient with labs and imaging as dictated above. Patient is in no acute distress, return to the ER today for weakness. Initially we had planned for discharge but x-rays revealed worsening pneumonia. It is unclear if he is aspirating, I will give him IV Zosyn. He may need a CT of the abdomen and pelvis to assess for partial small bowel obstruction.   Emily FilbertWilliams, Rya Rausch E, MD   Note: This dictation was prepared with Dragon dictation. Any transcriptional errors that result from this process are unintentional    Emily FilbertJonathan E Dana Dorner, MD 09/24/16 [phone]

## 2016-09-24 NOTE — ED Notes (Signed)
Patient transported to X-ray 

## 2016-09-24 NOTE — Progress Notes (Signed)
Patient troponin 0.07 MD notified; acknowledged. Also patient pain 10/10 orders received to administer oxycodone q4hr rather than q6hr

## 2016-09-24 NOTE — Progress Notes (Addendum)
CSW returned a second time and attempted to engage with pt at pt's bedside. CSW introduced herself and her role as a Child psychotherapistsocial worker. CSW asked how she could be helpful to pt and what pt would like to see happen as a result of his hospital visit. Pt states "I would like to be able to walk and move around". When asked if pt would be interested in rehab or would like to continue with home health services, pt rolled over and began snoring loudly. CSW attempted to engage with pt 2x after this but CSW's questions were ignored. CSW then asked pt if he would like CSW to leave so pt could get some rest. Pt stated that yes he would like for CSW to come back later.   Pt will be admitted to hospital. Room 137. CSW informed pt of room number and let him know that another social worker would now be following him. Medical CSW notified.  Jonathon JordanLynn B Tiki Tucciarone, MSW, Theresia MajorsLCSWA [phone]

## 2016-09-25 ENCOUNTER — Inpatient Hospital Stay: Payer: Medicare HMO

## 2016-09-25 LAB — GLUCOSE, CAPILLARY
GLUCOSE-CAPILLARY: 157 mg/dL — AB (ref 65–99)
GLUCOSE-CAPILLARY: 128 mg/dL — AB (ref 65–99)
Glucose-Capillary: 78 mg/dL (ref 65–99)
Glucose-Capillary: 88 mg/dL (ref 65–99)

## 2016-09-25 LAB — HIV ANTIBODY (ROUTINE TESTING W REFLEX): HIV SCREEN 4TH GENERATION: NONREACTIVE

## 2016-09-25 MED ORDER — PIPERACILLIN-TAZOBACTAM 3.375 G IVPB
3.3750 g | Freq: Three times a day (TID) | INTRAVENOUS | Status: DC
  Administered 2016-09-25 – 2016-09-26 (×3): 3.375 g via INTRAVENOUS
  Filled 2016-09-25 (×3): qty 50

## 2016-09-25 MED ORDER — INSULIN GLARGINE 100 UNIT/ML ~~LOC~~ SOLN
5.0000 [IU] | Freq: Every day | SUBCUTANEOUS | Status: DC
Start: 2016-09-25 — End: 2016-09-26
  Administered 2016-09-25: 5 [IU] via SUBCUTANEOUS
  Filled 2016-09-25 (×2): qty 0.05

## 2016-09-25 MED ORDER — POLYETHYLENE GLYCOL 3350 17 G PO PACK
17.0000 g | PACK | Freq: Every day | ORAL | Status: DC
  Administered 2016-09-25 – 2016-09-26 (×2): 17 g via ORAL
  Filled 2016-09-25 (×3): qty 1

## 2016-09-25 MED ORDER — SENNOSIDES-DOCUSATE SODIUM 8.6-50 MG PO TABS
2.0000 | ORAL_TABLET | Freq: Every day | ORAL | Status: DC
  Administered 2016-09-25: 2 via ORAL
  Filled 2016-09-25: qty 2

## 2016-09-25 MED ORDER — IOPAMIDOL (ISOVUE-370) INJECTION 76%
75.0000 mL | Freq: Once | INTRAVENOUS | Status: AC | PRN
  Administered 2016-09-25: 75 mL via INTRAVENOUS

## 2016-09-25 NOTE — Evaluation (Signed)
Physical Therapy Evaluation Patient Details Name: Jim [name] MRN: [id number] DOB: 10-31-1930 Today's Date: 09/25/2016   History of Present Illness  Jim [name]  is a 82 y.o. male with a known history of Recent sepsis with pneumonia, hypertension and diabetes. The patient was recently admitted for pneumonia and was discharged yesterday. Hecomplained of worsening weakness since discharge yesterday. But he denies any fever or chills, shortness of breath or wheezing. Chest x-ray show worsening right-sided pneumonia.  Clinical Impression  Pt admitted with above diagnosis. Pt currently with functional limitations due to the deficits listed below (see PT Problem List).  Pt is very eager to work with physical therapy on this date. At baseline pt most recently has been modified independent for bed mobility and sliding transfers to/from power wheelchair. Pt reports that up until a couple months ago he was able to perform squat/stand transfers to/from wheelchair. He has been getting progressively weaker and is currently unable to function without assistance. During PT evaluation pt requires modA+1 for bed mobility and is maxA+1 for attempted sit to stand transfer. Pt unable to come to standing. His LLE prosthesis is not available at time of evaluation. Pt also with forefoot ulcer on RLE and instructed to maintain heel WB only. Pt able to perform sliding board transfer from bed to recliner with modA+1 from therapist. Pt currently unsafe to return home and will need SNF placement to help him return to baseline level of function which is modified independent for mobility and ADLs. Pt will benefit from skilled PT services to address deficits in strength, balance, and mobility in order to return to full function at home.     Follow Up Recommendations SNF    Equipment Recommendations  None recommended by PT    Recommendations for Other Services       Precautions / Restrictions Precautions Precautions:  Fall Restrictions Weight Bearing Restrictions: Yes Other Position/Activity Restrictions: During last admission was RLE heel WB only due to R forefoot ulcer      Mobility  Bed Mobility Overal bed mobility: Needs Assistance Bed Mobility: Supine to Sit     Supine to sit: Mod assist     General bed mobility comments: Pt requires extended time and heavy cues for rolling and coming up to sitting at EOB. HOB elevated and bed rails utilized.   Transfers Overall transfer level: Needs assistance Equipment used: Rolling walker (2 wheeled) Transfers: Sit to/from Stand Sit to Stand: Total assist;From elevated surface         General transfer comment: Attempted sit to stand but pt unable to come to standing due to severe RLE weakness. Pt able to perform sliding board transfer with modA+1 from bed to recliner. RN educated about how to assist pt back to bed. Pt requires extensive cues for sequencing with sliding board. Performs slowly but able to complete safely  Ambulation/Gait             General Gait Details: Unable to perform  Stairs            Wheelchair Mobility    Modified Rankin (Stroke Patients Only)       Balance Overall balance assessment: Needs assistance Sitting-balance support: No upper extremity supported Sitting balance-Leahy Scale: Fair       Standing balance-Leahy Scale: Zero                               Pertinent Vitals/Pain Pain Assessment: 0-10 Pain  Score: 9  Pain Location: Lower abdomen chronic PTA, "It just hurts" Pain Intervention(s): Monitored during session    Home Living Family/patient expects to be discharged to:: Private residence Living Arrangements: Children (step-son who requires dependent care) Available Help at Discharge: Other (Comment) (None) Type of Home: House Home Access: Ramped entrance     Home Layout: One level Home Equipment: Shower seat;Grab bars - tub/shower;Grab bars - toilet;Walker - 2  wheels;Wheelchair - power Additional Comments: no BSC, no hospital bed but pt does have an adjustable bed    Prior Function Level of Independence: Needs assistance   Gait / Transfers Assistance Needed: Sliding transfers only recently  ADL's / Homemaking Assistance Needed: Independent with ADLs, meals on wheels        Hand Dominance   Dominant Hand: Right    Extremity/Trunk Assessment   Upper Extremity Assessment: Generalized weakness           Lower Extremity Assessment: LLE deficits/detail;Generalized weakness   LLE Deficits / Details: RLE is grossly weak. L BKA which is also grossly weak with hip flexion and knee flexion/extension. No prosthesis availabel at time of evaluation     Communication   Communication: No difficulties  Cognition Arousal/Alertness: Awake/alert Behavior During Therapy: WFL for tasks assessed/performed Overall Cognitive Status: Within Functional Limits for tasks assessed                      General Comments      Exercises     Assessment/Plan    PT Assessment Patient needs continued PT services  PT Problem List Decreased strength;Decreased range of motion;Decreased balance;Decreased mobility;Decreased knowledge of use of DME;Decreased safety awareness;Pain          PT Treatment Interventions DME instruction    PT Goals (Current goals can be found in the Care Plan section)  Acute Rehab PT Goals Patient Stated Goal: Return to modified independent for sliding transfers at home PT Goal Formulation: With patient Time For Goal Achievement: 2016/10/14 Potential to Achieve Goals: Good    Frequency Min 2X/week   Barriers to discharge Decreased caregiver support Pt lives alone and serves as caregiver to his stepson    Co-evaluation               End of Session Equipment Utilized During Treatment: Gait belt Activity Tolerance: Patient tolerated treatment well Patient left: in chair;with call bell/phone within reach;with  chair alarm set;with family/visitor present Nurse Communication: Mobility status;Other (comment) (Educated about use of sliding board)         Time: 4098-1191 PT Time Calculation (min) (ACUTE ONLY): 40 min   Charges:   PT Evaluation $PT Eval Moderate Complexity: 1 Procedure PT Treatments $Therapeutic Activity: 8-22 mins   PT G Codes:       Sharalyn Ink Taylore Hinde PT, DPT   Arthelia Callicott 09/25/2016, 2:28 PM

## 2016-09-25 NOTE — Care Management (Signed)
It is noted that patient is a readmission. [facility] and [facility].  He is readmitted for worsening weakness and PNA. Was set up with [facility] who never opened patient to home health services. He has a right foot ulcer that requires daily dressing changes. He lives at home with his son who has spina bifida. Son Frederich Balding(Timothy [phone])  from [place] is in and out of the home helping to care for patient approximately 2 weekdays and every weekend. It is reported that patient is wheelchair bound and often has falls from his wheelchair. Per CSW he is not eligible to go to SNF due to his current level of function. Will speak with son and follow progression.

## 2016-09-25 NOTE — Clinical Social Work Note (Addendum)
Clinical Social Work Assessment  Patient Details  Name: Jim [name] MRN: [id number] Date of Birth: 04-22-1930  Date of referral:  09/25/16               Reason for consult:  Facility Placement                Permission sought to share information with:  Oceanographeracility Contact Representative Permission granted to share information::  Yes, Verbal Permission Granted  Name::      Skilled Nursing Facility   Agency::   [place]  Relationship::     Contact Information:     Housing/Transportation Living arrangements for the past 2 months:  Single Family Home Source of Information:  Adult Children Patient Interpreter Needed:  None Criminal Activity/Legal Involvement Pertinent to Current Situation/Hospitalization:  No - Comment as needed Significant Relationships:  Adult Children Lives with:  Other (Comment) (Disabled son) Do you feel safe going back to the place where you live?    Need for family participation in patient care:  Yes (Comment)  Care giving concerns:  Patient is a readmit and was discharged from [facility] on 09/23/16 and [facility]. [place] in [place] with his son Jim [name] who has spinda bifida and is bed bound. Per patient's son Jim [name] reported that Jim [name] has home health care 3 days per week and his sister Jim AspCindy takes care of him. Per Jim [name] patient was also taking care of Jim [name] up until recently.    Social Worker assessment / plan:  Visual merchandiserClinical Social Worker (CSW) received consult for placement. ED CSW attempted twice to speak with patient however he asked CSW to leave. CSW contacted patient's son Jim [name]. Per son patient has been able to walk a little bit with a walker and get in and out of his wheelchair however now he can't even get up. Son asked about placement. CSW explained short term rehab placement and long term care. CSW explained that patient's [employer] will have to approve SNF for short term rehab. Per son patient can't pay privately for SNF. CSW  encouraged son to apply for long term care Medicaid for patient at DSS. Per son he will go to DSS and apply for Medicaid. Son reported that he [place] in [place] and works 3 days a week down there. Per son he can come stay with patient Friday through Monday. CSW explained that if [employer] will not approve SNF then son will have to take patient home and wait for long term care Medicaid to come through. CSW explained that a SNF referral can be sent out and [facility] started. Son is agreeable to SNF search. CSW will continue to follow and assist as needed.  Employment status:  Disabled (Comment on whether or not currently receiving Disability) Insurance information:  Managed Medicare PT Recommendations:  Not assessed at this time Information / Referral to community resources:  Skilled Nursing Facility  Patient/Family's Response to care: CSW explained to son Jim [name] that a short term rehab can be pursued from the hospital however if [facility] denies [facility] then patient will have to discharge home. Son verbalized his understanding. [employer] authorization has been started.   Patient/Family's Understanding of and Emotional Response to Diagnosis, Current Treatment, and Prognosis:  Patient's son Jim [name] was overwhelmed and reported that his father has recently gotten worse. CSW provided emotional support.   Emotional Assessment Appearance:  Appears stated age Attitude/Demeanor/Rapport:  Unable to Assess Affect (typically observed):  Unable to Assess Orientation:  Oriented  to Self, Fluctuating Orientation (Suspected and/or reported Sundowners), Oriented to Place Alcohol / Substance use:  Not Applicable Psych involvement (Current and /or in the community):  No (Comment)  Discharge Needs  Concerns to be addressed:  Discharge Planning Concerns Readmission within the last 30 days:  Yes Current discharge risk:  Dependent with Mobility, Chronically ill Barriers to Discharge:  Continued Medical Work up   [employer], Darleen Crocker, LCSW 09/25/2016, 2:21 PM

## 2016-09-25 NOTE — Clinical Social Work Placement (Signed)
   CLINICAL SOCIAL WORK PLACEMENT  NOTE  Date:  09/25/2016  Patient Details  Name: Jim [name] MRN: [id number] Date of Birth: 06/29/1930  Clinical Social Work is seeking post-discharge placement for this patient at the Skilled  Nursing Facility level of care (*CSW will initial, date and re-position this form in  chart as items are completed):  Yes   Patient/family provided with [facility] Clinical Social Work Department's list of facilities offering this level of care within the geographic area requested by the patient (or if unable, by the patient's family).  Yes   Patient/family informed of their freedom to choose among providers that offer the needed level of care, that participate in Medicare, Medicaid or managed care program needed by the patient, have an available bed and are willing to accept the patient.  Yes   Patient/family informed of [facility]'s ownership interest in [facility] and [facility], as well as of the fact that they are under no obligation to receive care at these facilities.  PASRR submitted to EDS on 09/25/16     PASRR number received on 09/25/16     Existing PASRR number confirmed on       FL2 transmitted to all facilities in geographic area requested by pt/family on 09/25/16     FL2 transmitted to all facilities within larger geographic area on       Patient informed that his/her managed care company has contracts with or will negotiate with certain facilities, including the following:            Patient/family informed of bed offers received.  Patient chooses bed at       Physician recommends and patient chooses bed at      Patient to be transferred to   on  .  Patient to be transferred to facility by       Patient family notified on   of transfer.  Name of family member notified:        PHYSICIAN       Additional Comment:    _______________________________________________ Micaela Stith, Darleen CrockerBailey M, LCSW 09/25/2016, 2:49 PM

## 2016-09-25 NOTE — Progress Notes (Signed)
Shift assessment completed at 0800. 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 Explained to pt that care manager will attempt to set up some type of placement for him with discharge, hopefully to rehab for strengthening, explained he needs to cooperate with evaluations so that this can happen. Pt is alert but gives one word answers to questions. Pt is on room air, lungs are clear to upper lobes, decreased to bases. Telepak in place, SR with pvc's noted. Abdomen is soft, bs hypoactive. Pt is L bka, R foot with ppp, cap refill wnl, multiple scabbed areas noted to toes that are open to air. Pt also has darkened discoloration to Rle and L leg as well. PIV #20 intact to R fa, site is free of redness and swelling. Since assesment, Dr. Has rounded, pt has had Cta of his chest completed. Pt also received pain medication after student assesment.

## 2016-09-25 NOTE — Progress Notes (Signed)
[facility]                                                                                                                                                                                            Patient Demographics   Jim [name], is a 82 y.o. male, DOB - 1930-11-04, WJX:[id number]  Admit date - 09/24/2016   Admitting Physician Shaune Pollack, MD  Outpatient Primary MD for the patient is Leanna Sato, MD   LOS - 1  Subjective: Patient with recent hospitalization treated for pneumonia now back with generalized weakness at the time of my evaluation he refused to wake up.    Review of Systems:   CONSTITUTIONAL: Unable to provide due to his current mental status  Vitals:   Vitals:   09/24/16 2335 09/25/16 0436 09/25/16 0805 09/25/16 1141  BP: (!) 111/47 120/65 123/78 138/75  Pulse: 89 (!) 102 72 80  Resp: 19 18 16 16   Temp: 98 F (36.7 C) 98.4 F (36.9 C) 98.2 F (36.8 C) 98.2 F (36.8 C)  TempSrc: Oral Oral Oral Oral  SpO2: 95% 96% 97% 94%  Weight:      Height:        Wt Readings from Last 3 Encounters:  09/24/16 160 lb 8 oz (72.8 kg)  09/16/16 160 lb 8 oz (72.8 kg)     Intake/Output Summary (Last 24 hours) at 09/25/16 1354 Last data filed at 09/25/16 0800  Gross per 24 hour  Intake              590 ml  Output                0 ml  Net              590 ml    Physical Exam:   GENERAL: Drowsy.  HEAD, EYES, EARS, NOSE AND THROAT: Atraumatic, normocephalic. Extraocular muscles are intact. Pupils equal and reactive to light. Sclerae anicteric. No conjunctival injection. No oro-pharyngeal erythema.  NECK: Supple. There is no jugular venous distention. No bruits, no lymphadenopathy, no thyromegaly.  HEART: Regular rate and rhythm,. No murmurs, no rubs, no clicks.  LUNGS: Rhonchus breath sounds at the right lung base ABDOMEN: Soft, flat, nontender, nondistended. Has good bowel sounds. No hepatosplenomegaly appreciated.   EXTREMITIES: No evidence of any cyanosis, clubbing, or peripheral edema.  +2 pedal and radial pulses bilaterally.  NEUROLOGIC: The patient is sleepy SKIN: Moist and warm with no rashes appreciated.  Psych: Not anxious, depressed LN: No inguinal LN enlargement    Antibiotics   Anti-infectives    Start  Dose/Rate Route Frequency Ordered Stop   09/24/16 2000  vancomycin (VANCOCIN) 1,250 mg in sodium chloride 0.9 % 250 mL IVPB     1,250 mg 166.7 mL/hr over 90 Minutes Intravenous Every 24 hours 09/24/16 1716     09/24/16 1800  aztreonam (AZACTAM) 2 g in dextrose 5 % 50 mL IVPB     2 g 100 mL/hr over 30 Minutes Intravenous Every 8 hours 09/24/16 1610 10/02/16 2159   09/24/16 1330  piperacillin-tazobactam (ZOSYN) IVPB 3.375 g     3.375 g 100 mL/hr over 30 Minutes Intravenous  Once 09/24/16 1327 09/24/16 1346   09/24/16 1315  vancomycin (VANCOCIN) IVPB 1000 mg/200 mL premix     1,000 mg 200 mL/hr over 60 Minutes Intravenous  Once 09/24/16 1306 09/24/16 1431   09/24/16 1315  piperacillin-tazobactam (ZOSYN) IVPB 3.375 g  Status:  Discontinued     3.375 g 12.5 mL/hr over 240 Minutes Intravenous  Once 09/24/16 1306 09/24/16 1327   09/24/16 1315  levofloxacin (LEVAQUIN) IVPB 750 mg     750 mg 100 mL/hr over 90 Minutes Intravenous  Once 09/24/16 1306 09/24/16 1445      Medications   Scheduled Meds: . aspirin  325 mg Oral Daily  . aztreonam  2 g Intravenous Q8H  . enoxaparin (LOVENOX) injection  40 mg Subcutaneous Q24H  . insulin aspart  0-5 Units Subcutaneous QHS  . insulin aspart  0-9 Units Subcutaneous TID WC  . insulin aspart protamine- aspart  45 Units Subcutaneous Q supper  . sodium chloride flush  3 mL Intravenous Q12H  . vancomycin  1,250 mg Intravenous Q24H   Continuous Infusions:  PRN Meds:.sodium chloride, oxycodone, sodium chloride flush   Data Review:   Micro Results Recent Results (from the past 240 hour(s))  Blood culture (routine x 2)     Status: None    Collection Time: 09/16/16  2:45 PM  Result Value Ref Range Status   Specimen Description BLOOD RIGHT WRIST  Final   Special Requests BOTTLES DRAWN AEROBIC AND ANAEROBIC .  Final   Culture NO GROWTH 5 DAYS  Final   Report Status 09/21/2016 FINAL  Final  Blood culture (routine x 2)     Status: None   Collection Time: 09/16/16  4:51 PM  Result Value Ref Range Status   Specimen Description BLOOD LEFT HAND  Final   Special Requests   Final    BOTTLES DRAWN AEROBIC AND ANAEROBIC AER . ANA   Culture NO GROWTH 5 DAYS  Final   Report Status 09/21/2016 FINAL  Final  Blood culture (routine x 2)     Status: None (Preliminary result)   Collection Time: 09/24/16 11:32 AM  Result Value Ref Range Status   Specimen Description BLOOD RIGHT FOREARM  Final   Special Requests BOTTLES DRAWN AEROBIC AND ANAEROBIC  2CC  Final   Culture NO GROWTH < 24 HOURS  Final   Report Status PENDING  Incomplete  Blood culture (routine x 2)     Status: None (Preliminary result)   Collection Time: 09/24/16  1:15 PM  Result Value Ref Range Status   Specimen Description BLOOD RIGHT FOREARM  Final   Special Requests BOTTLES DRAWN AEROBIC AND ANAEROBIC  2CC  Final   Culture NO GROWTH < 24 HOURS  Final   Report Status PENDING  Incomplete  MRSA PCR Screening     Status: None   Collection Time: 09/24/16  5:25 PM  Result Value Ref Range Status  MRSA by PCR NEGATIVE NEGATIVE Final    Comment:        The GeneXpert MRSA Assay (FDA approved for NASAL specimens only), is one component of a comprehensive MRSA colonization surveillance program. It is not intended to diagnose MRSA infection nor to guide or monitor treatment for MRSA infections.     Radiology Reports Dg Chest 2 View  Result Date: 09/18/2016 CLINICAL DATA:  Shortness of breath.  Diabetic. EXAM: CHEST  2 VIEW COMPARISON:  09/16/2016 FINDINGS: Heart size is enlarged. There is infiltrate within the right lower lobe and right middle lobe consistent  with infectious infiltrate. No pulmonary edema. Left lung is clear. IMPRESSION: Persistent right middle lobe and right lower lobe infiltrates. Electronically Signed   By: Norva PavlovElizabeth  Brown M.D.   On: 09/18/2016 08:44   Dg Chest 2 View  Result Date: 09/16/2016 CLINICAL DATA:  Larey SeatFell this morning.  Shortness of breath. EXAM: CHEST  2 VIEW COMPARISON:  05/09/2010 FINDINGS: Artifact overlies the chest. Heart size is normal. There is aortic atherosclerosis. The left lung is clear. There is right lower and middle lobe pneumonia. Chronic degenerative changes affect the spine. IMPRESSION: Right lower and middle lobe pneumonia. No dense consolidation or lobar collapse. Aortic atherosclerosis. Electronically Signed   By: Paulina FusiMark  Shogry M.D.   On: 09/16/2016 15:54   Ct Angio Chest Pe W Or Wo Contrast  Result Date: 09/25/2016 CLINICAL DATA:  Sepsis and pneumonia. Hypertension and diabetes. Shortness of breath. EXAM: CT ANGIOGRAPHY CHEST WITH CONTRAST TECHNIQUE: Multidetector CT imaging of the chest was performed using the standard protocol during bolus administration of intravenous contrast. Multiplanar CT image reconstructions and MIPs were obtained to evaluate the vascular anatomy. CONTRAST:  75 cc of Isovue 370 COMPARISON:  chest radiograph 09/24/2016.  No prior CT. FINDINGS: Cardiovascular: The quality of this exam for evaluation of pulmonary embolism is good. The bolus is well timed. The primary limitation is below described motion. No evidence of pulmonary embolism. Aortic and branch vessel atherosclerosis. No aneurysm. Suboptimal evaluation for dissection secondary to bolus timing. Moderate cardiomegaly. Multivessel coronary artery atherosclerosis. Mediastinum/Nodes: No mediastinal or hilar adenopathy. Small hiatal hernia. Lungs/Pleura: Small right-sided pleural effusion. Trace left pleural fluid or thickening. Moderate centrilobular emphysema. 8 mm nodular density in the right upper lobe on image 35/ series 6. Right  lower and right middle lobe patchy airspace disease is peripheral predominant. Minimal inferior right upper lobe airspace disease. Subpleural left upper lobe 7 mm nodule on image 61/ series 6. Upper Abdomen: Normal imaged portions of the liver, spleen, right adrenal gland. Left adrenal thickening. Right renal vascular calcification. Upper pole left renal 2.8 cm cyst. Abdominal aortic and branch vessel atherosclerosis. Large colonic stool burden. Small volume right upper quadrant ascites. Fluid is seen adjacent to the gallbladder including on image 94/series 4. Musculoskeletal: Moderate thoracic spondylosis. Review of the MIP images confirms the above findings. IMPRESSION: 1. Mildly motion degraded exam. 2.  No evidence of pulmonary embolism. 3. Multifocal right-sided airspace disease is consistent with pneumonia. 4. Bilateral pulmonary nodules, maximally 8 mm. Per consensus criteria, potential clinical strategies include followup CT, PET-CT, or tissue sampling at 3 months. This recommendation follows the consensus statement: Guidelines for Management of Incidental Pulmonary Nodules Detected on CT Images:From the Fleischner Society 2017; published online before print (10.1148/[id number]). 5.  Coronary artery atherosclerosis. Aortic atherosclerosis. 6.  Possible constipation. 7. Upper abdominal ascites, including adjacent the gallbladder. No specific evidence of acute cholecystitis. If there are right upper quadrant symptoms, consider right upper  quadrant ultrasound. 8. Small right pleural effusion. 9. Small hiatal hernia. Electronically Signed   By: Jeronimo Greaves M.D.   On: 09/25/2016 11:49   Dg Abdomen Acute W/chest  Result Date: 09/24/2016 CLINICAL DATA:  Weakness, abdominal pain EXAM: DG ABDOMEN ACUTE W/ 1V CHEST COMPARISON:  09/18/2016 FINDINGS: Cardiomediastinal silhouette is stable. There is worsening pneumonia in right lower lobe and right middle lobe. No pulmonary edema. Abundant stool noted in  right colon. Moderate gas noted in splenic flexure of the colon and proximal left colon. Mild gaseous distended small bowel loops mid abdomen suspicious for ileus or Zackariah bowel obstruction. Paucity of bowel gas within pelvis. No evidence of free abdominal air. IMPRESSION: Worsening pneumonia/infiltrate in right lower lobe and right middle lobe. No pulmonary edema. Abundant stool noted in right colon. Moderate gas noted in splenic flexure of the colon. Mild gaseous distended small bowel loops mid abdomen suspicious for ileus or Rjay bowel obstruction. Paucity of bowel gas within pelvis. Electronically Signed   By: Natasha Mead M.D.   On: 09/24/2016 12:56     CBC  Recent Labs Lab 09/23/16 1132 09/24/16 1132  WBC 10.1 9.8  HGB 15.3 15.6  HCT 44.5 45.7  PLT 161 193  MCV 84.9 84.7  MCH 29.1 29.0  MCHC 34.3 34.2  RDW 15.7* 15.9*  LYMPHSABS  --  1.1  MONOABS  --  0.9  EOSABS  --  0.1  BASOSABS  --  0.0    Chemistries   Recent Labs Lab 09/21/16 0355 09/24/16 1132 09/24/16 1824  NA  --  139  --   K  --  3.9  --   CL  --  108  --   CO2  --  23  --   GLUCOSE  --  270*  --   BUN  --  13  --   CREATININE 0.93 1.09 1.14  CALCIUM  --  8.0*  --   AST  --  12*  --   ALT  --  9*  --   ALKPHOS  --  65  --   BILITOT  --  1.1  --    ------------------------------------------------------------------------------------------------------------------ estimated creatinine clearance is 50.6 mL/min (by C-G formula based on SCr of 1.14 mg/dL). ------------------------------------------------------------------------------------------------------------------ No results for input(s): HGBA1C in the last 72 hours. ------------------------------------------------------------------------------------------------------------------ No results for input(s): CHOL, HDL, LDLCALC, TRIG, CHOLHDL, LDLDIRECT in the last 72  hours. ------------------------------------------------------------------------------------------------------------------ No results for input(s): TSH, T4TOTAL, T3FREE, THYROIDAB in the last 72 hours.  Invalid input(s): FREET3 ------------------------------------------------------------------------------------------------------------------ No results for input(s): VITAMINB12, FOLATE, FERRITIN, TIBC, IRON, RETICCTPCT in the last 72 hours.  Coagulation profile No results for input(s): INR, PROTIME in the last 168 hours.  No results for input(s): DDIMER in the last 72 hours.  Cardiac Enzymes  Recent Labs Lab 09/24/16 1132 09/24/16 1824  TROPONINI 0.06* 0.07*   ------------------------------------------------------------------------------------------------------------------ Invalid input(s): POCBNP    Assessment & Plan  Patient is a 82 year old recently hospitalized now readmitted after being at home for 1 day  1. Worsening pneumonia (HAP) Continue broad-spectrum antibiotics Due to the location of pneumonia I am concern about aspiration I will ask speech to see  2. Elevated troponin. due to demanding ischemia.   3. Hypertension. Blood pressure is under control without medication.  4. Diabetes. Blood sugars were low earlier we'll continue to monitor may need to decrease his 70/30      Code Status Orders        Start  Ordered   09/24/16 1611  Full code  Continuous     09/24/16 1610    Code Status History    Date Active Date Inactive Code Status Order ID Comments User Context   09/16/2016  9:13 PM 09/23/2016  5:20 PM Full Code [id number]  Enid Baas, MD ED           Consults  None DVT Prophylaxis  Lovenox  Lab Results  Component Value Date   PLT 193 09/24/2016     Time Spent in minutes   Greater than 50% of time spent in care coordination and counseling patient regarding the condition and plan of care.   Auburn Bilberry M.D on  09/25/2016 at 1:54 PM  Between 7am to 6pm - Pager - [phone]  After 6pm go to [email or web address] - password [email or web address] [facility]  [facility]   Office  [phone]

## 2016-09-25 NOTE — Evaluation (Addendum)
Clinical/Bedside Swallow Evaluation Patient Details  Name: Jim [name] MRN: [id number] Date of Birth: [birth year]  Today's Date: 09/25/2016 Time: SLP Start Time (ACUTE ONLY): 1215 SLP Stop Time (ACUTE ONLY): 1300 SLP Time Calculation (min) (ACUTE ONLY): 45 min  Past Medical History:  Past Medical History:  Diagnosis Date  . Diabetes mellitus without complication (HCC)   . Hypertension   . Right foot ulcer (HCC)    Past Surgical History:  Past Surgical History:  Procedure Laterality Date  . LEG AMPUTATION BELOW KNEE  1967   RIGHT   HPI:  Pt  is a 82 y.o. male with a known history of Recent sepsis with pneumonia, hypertension and diabetes. The patient was recently admitted for pneumonia and was discharged yesterday. Hecomplained of worsening weakness since discharge yesterday. But he denies any fever or chills, shortness of breath or wheezing. Chest x-ray show worsening right-sided pneumonia. Noted old CXR results ~7 years ago indicating R lobe density band each time (x2) but nothing since.    Assessment / Plan / Recommendation Clinical Impression  Pt appeared to adequately tolerate trials of po's w/ no overt s/s of aspiration; he appears at reduced risk for aspiration if following general aspiration precautions. Pt consumed po trials of thin liquids via cup and straw w/ no overt s/s of aspiration; fed himself taking multiple sips at a time. Pt exhibited no significant oral phase deficits feeding self soft solids and cleared adequately b/t trials given min extra time d/t edentulous status. Pt stated he usually wears his dentures. These were found in his closet and provided to him for wear at dinner meal. Pt will benefit from a Regular diet w/ thin liquids w/ general aspiration precautions; pt may benefit from meats cut well, moist. Recommend meds w/ liquids but use Puree if necessary. ST will be available for any further education on aspiration precautions while admitted if needed. NSG  updated.  Addendum 09/26/16:  Per recent CT exam, pt does have a noted Hiatal Hernia. If pt is experiencing any Esophageal dysmotility or Reflux, he is at increased risk for aspiration of the regurgitated food/liquid material especially if lying down immediately following meals or any oral intake. Strongly recommend sitting fully upright w/ all oral intake and for ~45 minutes post meals - Reflux precautions.    Aspiration Risk   (reduced following precautions)    Diet Recommendation  Regular diet w/ meats cut well, moist; Thin liquids; use of Dentures during meals; general aspiration precautions including SITTING FULLY UPRIGHT for oral intake, AND following general REFLUX precautions d/t noted hiatal hernia per recent exam.   Medication Administration: Whole meds with liquid (as tolerates, or in a Puree)    Other  Recommendations Recommended Consults:  (n/a at this time) Oral Care Recommendations: Oral care BID;Staff/trained caregiver to provide oral care (wears dentures)   Follow up Recommendations None      Frequency and Duration            Prognosis Prognosis for Safe Diet Advancement: Good      Swallow Study   General Date of Onset: 09/24/16 HPI: Pt  is a 82 y.o. male with a known history of Recent sepsis with pneumonia, hypertension and diabetes. The patient was recently admitted for pneumonia and was discharged yesterday. Hecomplained of worsening weakness since discharge yesterday. But he denies any fever or chills, shortness of breath or wheezing. Chest x-ray show worsening right-sided pneumonia. Noted old CXR results ~7 years ago indicating R lobe density band each  time (x2) but nothing since.  Type of Study: Bedside Swallow Evaluation Previous Swallow Assessment: none indicated Diet Prior to this Study: Regular;Thin liquids Temperature Spikes Noted: No (wbc not elevated) Respiratory Status: Room air History of Recent Intubation: No Behavior/Cognition:  Alert;Cooperative;Pleasant mood Oral Cavity Assessment: Within Functional Limits Oral Care Completed by SLP: Recent completion by staff Oral Cavity - Dentition: Edentulous (wears dentures - not in place during eval) Vision: Functional for self-feeding Self-Feeding Abilities: Able to feed self;Needs set up Patient Positioning: Postural control adequate for testing (encouraged full upright position) Baseline Vocal Quality: Normal Volitional Cough: Strong Volitional Swallow: Able to elicit    Oral/Motor/Sensory Function Overall Oral Motor/Sensory Function: Within functional limits   Ice Chips Ice chips: Not tested   Thin Liquid Thin Liquid: Within functional limits Presentation: Self Fed;Straw (~4 ozs total ) Other Comments: multiple swallowing    Nectar Thick Nectar Thick Liquid: Not tested   Honey Thick Honey Thick Liquid: Not tested   Puree Puree: Within functional limits Presentation: Self Fed;Spoon (4 ozs)   Solid   GO   Solid: Within functional limits (chopped down; 3 trials) Presentation: Self Fed;Spoon Other Comments: min extra time d/t not wearing dentures during eval         Jerilynn SomKatherine Cornesha Radziewicz, MS, CCC-SLP  Jim [name] 09/25/2016,3:02 PM

## 2016-09-25 NOTE — NC FL2 (Signed)
[place] MEDICAID FL2 LEVEL OF CARE SCREENING TOOL     IDENTIFICATION  Patient Name: Jim [name] L Iannelli Birthdate: 1930/05/25 Sex: male Admission Date (Current Location): 09/24/2016  [place]   Facility and Address:  [facility], [address], [place], [place] [address]      Provider Number: [id number]  Attending Physician Name and Address:  Auburn BilberryShreyang Patel, MD  Relative Name and Phone Number:       Current Level of Care: Hospital Recommended Level of Care: Skilled Nursing Facility Prior Approval Number:    Date Approved/Denied:   PASRR Number:  [id number])  Discharge Plan: SNF    Current Diagnoses: Patient Active Problem List   Diagnosis Date Noted  . Pneumonia 09/24/2016  . Pressure injury of skin 09/17/2016  . Sepsis (HCC) 09/16/2016    Orientation RESPIRATION BLADDER Height & Weight     Self, Time, Place  Normal Continent Weight: 160 lb 8 oz (72.8 kg) Height:  6\' 1"  (185.4 cm)  BEHAVIORAL SYMPTOMS/MOOD NEUROLOGICAL BOWEL NUTRITION STATUS   (None. )  (None. ) Continent Diet (Diet: Heart Healthy/ Carb Modified )  AMBULATORY STATUS COMMUNICATION OF NEEDS Skin   Extensive Assist Verbally PU Stage and Appropriate Care (Stage IV: Right foot. )                       Personal Care Assistance Level of Assistance  Bathing, Feeding, Dressing Bathing Assistance: Limited assistance Feeding assistance: Limited assistance Dressing Assistance: Limited assistance     Functional Limitations Info  Sight, Hearing, Speech Sight Info: Adequate Hearing Info: Adequate Speech Info: Impaired (Dentures )    SPECIAL CARE FACTORS FREQUENCY  PT (By licensed PT), OT (By licensed OT)     PT Frequency:  (5) OT Frequency:  (5)            Contractures      Additional Factors Info  Code Status, Allergies, Insulin Sliding Scale Code Status Info:  (Full Code. ) Allergies Info:  (Penicillins )   Insulin Sliding  Scale Info:  (NovoLog )       Current Medications (09/25/2016):  This is the current hospital active medication list Current Facility-Administered Medications  Medication Dose Route Frequency Provider Last Rate Last Dose  . 0.9 %  sodium chloride infusion  250 mL Intravenous PRN Shaune PollackQing Chen, MD      . aspirin tablet 325 mg  325 mg Oral Daily Shaune PollackQing Chen, MD   325 mg at 09/25/16 [phone]  . aztreonam (AZACTAM) 2 g in dextrose 5 % 50 mL IVPB  2 g Intravenous Q8H Shaune PollackQing Chen, MD   2 g at 09/25/16 0524  . enoxaparin (LOVENOX) injection 40 mg  40 mg Subcutaneous Q24H Shaune PollackQing Chen, MD   40 mg at 09/24/16 2121  . insulin aspart (novoLOG) injection 0-5 Units  0-5 Units Subcutaneous QHS Shaune PollackQing Chen, MD   2 Units at 09/24/16 2121  . insulin aspart (novoLOG) injection 0-9 Units  0-9 Units Subcutaneous TID WC Shaune PollackQing Chen, MD   5 Units at 09/24/16 1632  . insulin aspart protamine- aspart (NOVOLOG MIX 70/30) injection 45 Units  45 Units Subcutaneous Q supper Shaune PollackQing Chen, MD   45 Units at 09/24/16 1632  . oxyCODONE (Oxy IR/ROXICODONE) immediate release tablet 7.5 mg  7.5 mg Oral Q4H PRN Altamese DillingVaibhavkumar Vachhani, MD   7.5 mg at 09/25/16 0823  . sodium chloride flush (NS) 0.9 % injection 3 mL  3 mL Intravenous  Q12H Shaune Pollack, MD      . sodium chloride flush (NS) 0.9 % injection 3 mL  3 mL Intravenous PRN Shaune Pollack, MD      . vancomycin (VANCOCIN) 1,250 mg in sodium chloride 0.9 % 250 mL IVPB  1,250 mg Intravenous Q24H Cindi Carbon, RPH   1,250 mg at 09/24/16 2121     Discharge Medications: Please see discharge summary for a list of discharge medications.  Relevant Imaging Results:  Relevant Lab Results:   Additional Information  (SSN: [id number]. Leg amputation below right knee. )  Ralene Bathe, Student-Social Work

## 2016-09-25 NOTE — Progress Notes (Signed)
Pt tolerated being oob to recliner for approx 1 hour and then was returned to bed using slide board. Pt tolerated well but c/o pain, received pain medication afterwards. Family members present. At this time, pt was repositioned, rendered only minimal assisstance, pt was unable to turn in the bed himself. Pt is citing pain to his back, abdomen, despite medication. Little grimacing/guarding noted. Call bell in reach.

## 2016-09-25 NOTE — Plan of Care (Signed)
Problem: Pain Management: Goal: Expressions of feelings of enhanced comfort will increase Outcome: Not Progressing Pt has worked with physical therapy, has little stamina. Pt has remained free of falls/injury this shift  .

## 2016-09-25 NOTE — Consult Note (Signed)
Pharmacy Antibiotic Note  Jim [name] is a 82 y.o. male admitted on 09/24/2016 with pneumonia.  Pharmacy has been consulted for zosyn dosing. Pt had 5 days of ceftriaxone/azithromycin now with worsening PNA. Dr. Allena KatzPatel thinks could possible be aspiration.   Plan: Zosyn 3.375g IV q8h (4 hour infusion).   Pt has a PNC allergy (rash) listed. Pt received a dose of zosyn in ED. Tolerated without issue.  Height: 6\' 1"  (185.4 cm) Weight: 160 lb 8 oz (72.8 kg) IBW/kg (Calculated) : 79.9  Temp (24hrs), Avg:98.3 F (36.8 C), Min:98 F (36.7 C), Max:98.7 F (37.1 C)   Recent Labs Lab 09/21/16 0355 09/23/16 1132 09/24/16 1132 09/24/16 1824  WBC  --  10.1 9.8  --   CREATININE 0.93  --  1.09 1.14    Estimated Creatinine Clearance: 50.6 mL/min (by C-G formula based on SCr of 1.14 mg/dL).    Allergies  Allergen Reactions  . Penicillins Rash    Antimicrobials this admission: zosyn 9/27 >> one dose, 9/28>> aztreonam 9/27 >> 9/28 Vancomycin 9/27>> Levofloxacin 9/27>>one dose  Dose adjustments this admission:   Microbiology results: 9/27 BCx: ng <24 hr  9/27 MRSA PCR: neg  Thank you for allowing pharmacy to be a part of this patient's care.  Olene FlossMelissa D Maccia, Pharm.D Clinical Pharmacist  09/25/2016 2:35 PM

## 2016-09-25 NOTE — Consult Note (Signed)
Pt asleep; CH dropped of Advanced Directive form with RN who will deliver to pt; CH available to aid in completion if pt requests; 5:41 PM Jim [name]

## 2016-09-25 NOTE — Progress Notes (Signed)
PT Cancellation Note  Patient Details Name: Jim [name] MRN: [id number] DOB: Mar 08, 1930   Cancelled Treatment:    Reason Eval/Treat Not Completed: Patient at procedure or test/unavailable.Order received and chart reviewed. Attempted to see patient but not available. Will attempt at later time/date as pt is available/appropriate.  Sharalyn InkJason D Huprich PT, DPT   Huprich,Jason 09/25/2016, 11:27 AM

## 2016-09-26 LAB — GLUCOSE, CAPILLARY
GLUCOSE-CAPILLARY: 94 mg/dL (ref 65–99)
GLUCOSE-CAPILLARY: 181 mg/dL — AB (ref 65–99)
GLUCOSE-CAPILLARY: 187 mg/dL — AB (ref 65–99)
Glucose-Capillary: 194 mg/dL — ABNORMAL HIGH (ref 65–99)

## 2016-09-26 MED ORDER — ASPIRIN 325 MG PO TABS: 325.0000 mg | ORAL_TABLET | Freq: Every day | ORAL | 1 refills | Status: DC

## 2016-09-26 MED ORDER — AMOXICILLIN-POT CLAVULANATE 875-125 MG PO TABS
1.0000 | ORAL_TABLET | Freq: Two times a day (BID) | ORAL | Status: DC
  Administered 2016-09-26: 1 via ORAL
  Filled 2016-09-26: qty 1

## 2016-09-26 MED ORDER — POLYETHYLENE GLYCOL 3350 17 G PO PACK: 17.0000 g | PACK | Freq: Every day | ORAL | 0 refills | Status: DC

## 2016-09-26 MED ORDER — FLEET ENEMA 7-19 GM/118ML RE ENEM
1.0000 | ENEMA | Freq: Once | RECTAL | Status: AC
  Administered 2016-09-26: 1 via RECTAL

## 2016-09-26 MED ORDER — OXYCODONE HCL 15 MG PO TABS: 7.5000 mg | ORAL_TABLET | ORAL | 0 refills | Status: DC | PRN

## 2016-09-26 MED ORDER — AMOXICILLIN-POT CLAVULANATE 875-125 MG PO TABS: 1.0000 | ORAL_TABLET | Freq: Two times a day (BID) | ORAL | 0 refills | Status: DC

## 2016-09-26 NOTE — Progress Notes (Signed)
Pt being discharged to [facility] this evening. PIV removed. Discharge packet completed, to be sent with pt. Pt and family are aware and in agreement with plan. Report called to Riley BingKim Rn at facility, all questions answered. Pt is leaving with all his belongings, will be transported via EMS.

## 2016-09-26 NOTE — Clinical Social Work Placement (Signed)
   CLINICAL SOCIAL WORK PLACEMENT  NOTE  Date:  09/26/2016  Patient Details  Name: Jim Pilgrimarly L [name] MRN: [id number] Date of Birth: 06-18-1930  Clinical Social Work is seeking post-discharge placement for this patient at the Skilled  Nursing Facility level of care (*CSW will initial, date and re-position this form in  chart as items are completed):  Yes   Patient/family provided with [facility] Clinical Social Work Department's list of facilities offering this level of care within the geographic area requested by the patient (or if unable, by the patient's family).  Yes   Patient/family informed of their freedom to choose among providers that offer the needed level of care, that participate in Medicare, Medicaid or managed care program needed by the patient, have an available bed and are willing to accept the patient.  Yes   Patient/family informed of [facility]'s ownership interest in [facility] and [facility], as well as of the fact that they are under no obligation to receive care at these facilities.  PASRR submitted to EDS on 09/25/16     PASRR number received on 09/25/16     Existing PASRR number confirmed on       FL2 transmitted to all facilities in geographic area requested by pt/family on 09/25/16     FL2 transmitted to all facilities within larger geographic area on       Patient informed that his/her managed care company has contracts with or will negotiate with certain facilities, including the following:        Yes   Patient/family informed of bed offers received.  [facility] at  [facility]     Physician recommends and patient chooses bed at      Patient to be transferred to  ([facility] ) on 09/26/16.  Patient to be transferred to facility by  [facility] )     Patient family notified on 09/26/16 of transfer.  Name of family member notified:   (Patient's son [name] is aware of D/C today. )     PHYSICIAN       Additional Comment:     _______________________________________________ Jhovanny Guinta, Darleen CrockerBailey M, LCSW 09/26/2016, 5:05 PM

## 2016-09-26 NOTE — Progress Notes (Signed)
Attending MD is agreeable to do peer to peer. Clinical Child psychotherapistocial Worker (CSW) contacted [facility] case manager and made her aware of above and gave her attending physician name and pager number. CSW also made Noreene LarssonJill aware that [facility] is the facility the family chose. [facility] Medical Director will review clinicals and call [facility] MD if she feels he does not meet criteria for SNF to do a peer to peer. Per Noreene LarssonJill a determination should be made today. CSW will continue to follow and assist as needed.   [employer] Ellar [place], Jim [name] [phone]

## 2016-09-26 NOTE — Discharge Summary (Signed)
[facility] [place] [facility]   PATIENT NAME: Jim [name]    MR#:  [id number]  DATE OF BIRTH:  Feb 14, 1930  DATE OF ADMISSION:  09/24/2016 ADMITTING PHYSICIAN: Shaune Pollack, MD  DATE OF DISCHARGE: 09/26/2016  PRIMARY CARE PHYSICIAN: Leanna Sato, MD    ADMISSION DIAGNOSIS:  Ileus (HCC) [K56.7] Elevated troponin I level [R79.89] HCAP (healthcare-associated pneumonia) [J18.9]  DISCHARGE DIAGNOSIS:  Pneumonia-multifocal Weakness Dm-2 SECONDARY DIAGNOSIS:   Past Medical History:  Diagnosis Date  . Diabetes mellitus without complication (HCC)   . Hypertension   . Right foot ulcer [facility])     HOSPITAL COURSE:   82 year old recently hospitalized now readmitted after being at home for 1 day  1. Worsening pneumonia (HCAP) Continue broad-spectrum antibiotics pt was on Vanc and Zosyn---change to po augmentin -wbc 9.8 -no fever -BC negative  2. Elevated troponin. due to demanding ischemia. -no cp  3. Hypertension. Blood pressure is under control without medication.  4. Diabetes.lanuts 5 units daily ssi  5. Gen deconditioning Due to pt's current illness he is deconditioned to the point he is not able to transfer himself back and forth from his [facility] for ADL's He will definitely benefit from rehab to get him at his baseline functioning where he can do his ADLS  CM working for d/c planning---[facility]  CONSULTS OBTAINED:  Treatment Team:  Enedina Finner, MD  DRUG ALLERGIES:   Allergies  Allergen Reactions  . Penicillins Rash    DISCHARGE MEDICATIONS:   Current Discharge Medication List    START taking these medications   Details  amoxicillin-clavulanate (AUGMENTIN) 875-125 MG tablet Take 1 tablet by mouth every 12 (twelve) hours. Qty: 12 tablet, Refills: 0    aspirin 325 MG tablet Take 1 tablet (325 mg total) by mouth daily. Qty: 30 tablet, Refills: 1    polyethylene glycol (MIRALAX / GLYCOLAX) packet Take 17 g by  mouth daily. Qty: 14 each, Refills: 0      CONTINUE these medications which have NOT CHANGED   Details  insulin aspart protamine- aspart (NOVOLOG MIX 70/30) (70-30) 100 UNIT/ML injection Inject 45 Units into the skin daily with supper. Patient's daughter call pharmacy to confirm Novolog 70/30    LANTUS 100 UNIT/ML injection Inject 5 Units into the skin daily.    oxycodone (ROXICODONE) 30 MG immediate release tablet Take 7.5 mg by mouth every 6 (six) hours as needed for pain.        If you experience worsening of your admission symptoms, develop shortness of breath, life threatening emergency, suicidal or homicidal thoughts you must seek medical attention immediately by calling 911 or calling your MD immediately  if symptoms less severe.  You Must read complete instructions/literature along with all the possible adverse reactions/side effects for all the Medicines you take and that have been prescribed to you. Take any new Medicines after you have completely understood and accept all the possible adverse reactions/side effects.   Please note  You were cared for by a hospitalist during your hospital stay. If you have any questions about your discharge medications or the care you received while you were in the hospital after you are discharged, you can call the unit and asked to speak with the hospitalist on call if the hospitalist that took care of you is not available. Once you are discharged, your primary care physician will handle any further medical issues. Please note that NO REFILLS for any discharge medications will be authorized once  you are discharged, as it is imperative that you return to your primary care physician (or establish a relationship with a primary care physician if you do not have one) for your aftercare needs so that they can reassess your need for medications and monitor your lab values.   DATA REVIEW:   CBC   Recent Labs Lab 09/24/16 1132  WBC 9.8  HGB 15.6   HCT 45.7  PLT 193    Chemistries   Recent Labs Lab 09/24/16 1132 09/24/16 1824  NA 139  --   K 3.9  --   CL 108  --   CO2 23  --   GLUCOSE 270*  --   BUN 13  --   CREATININE 1.09 1.14  CALCIUM 8.0*  --   AST 12*  --   ALT 9*  --   ALKPHOS 65  --   BILITOT 1.1  --     Microbiology Results   Recent Results (from the past 240 hour(s))  Blood culture (routine x 2)     Status: None (Preliminary result)   Collection Time: 09/24/16 11:32 AM  Result Value Ref Range Status   Specimen Description BLOOD RIGHT FOREARM  Final   Special Requests BOTTLES DRAWN AEROBIC AND ANAEROBIC  2CC  Final   Culture NO GROWTH 2 DAYS  Final   Report Status PENDING  Incomplete  Blood culture (routine x 2)     Status: None (Preliminary result)   Collection Time: 09/24/16  1:15 PM  Result Value Ref Range Status   Specimen Description BLOOD RIGHT FOREARM  Final   Special Requests BOTTLES DRAWN AEROBIC AND ANAEROBIC  2CC  Final   Culture NO GROWTH 2 DAYS  Final   Report Status PENDING  Incomplete  MRSA PCR Screening     Status: None   Collection Time: 09/24/16  5:25 PM  Result Value Ref Range Status   MRSA by PCR NEGATIVE NEGATIVE Final    Comment:        The GeneXpert MRSA Assay (FDA approved for NASAL specimens only), is one component of a comprehensive MRSA colonization surveillance program. It is not intended to diagnose MRSA infection nor to guide or monitor treatment for MRSA infections.     RADIOLOGY:  Ct Angio Chest Pe W Or Wo Contrast  Result Date: 09/25/2016 CLINICAL DATA:  Sepsis and pneumonia. Hypertension and diabetes. Shortness of breath. EXAM: CT ANGIOGRAPHY CHEST WITH CONTRAST TECHNIQUE: Multidetector CT imaging of the chest was performed using the standard protocol during bolus administration of intravenous contrast. Multiplanar CT image reconstructions and MIPs were obtained to evaluate the vascular anatomy. CONTRAST:  75 cc of Isovue 370 COMPARISON:  chest radiograph  09/24/2016.  No prior CT. FINDINGS: Cardiovascular: The quality of this exam for evaluation of pulmonary embolism is good. The bolus is well timed. The primary limitation is below described motion. No evidence of pulmonary embolism. Aortic and branch vessel atherosclerosis. No aneurysm. Suboptimal evaluation for dissection secondary to bolus timing. Moderate cardiomegaly. Multivessel coronary artery atherosclerosis. Mediastinum/Nodes: No mediastinal or hilar adenopathy. Small hiatal hernia. Lungs/Pleura: Small right-sided pleural effusion. Trace left pleural fluid or thickening. Moderate centrilobular emphysema. 8 mm nodular density in the right upper lobe on image 35/ series 6. Right lower and right middle lobe patchy airspace disease is peripheral predominant. Minimal inferior right upper lobe airspace disease. Subpleural left upper lobe 7 mm nodule on image 61/ series 6. Upper Abdomen: Normal imaged portions of the liver, spleen, right adrenal gland.  Left adrenal thickening. Right renal vascular calcification. Upper pole left renal 2.8 cm cyst. Abdominal aortic and branch vessel atherosclerosis. Large colonic stool burden. Small volume right upper quadrant ascites. Fluid is seen adjacent to the gallbladder including on image 94/series 4. Musculoskeletal: Moderate thoracic spondylosis. Review of the MIP images confirms the above findings. IMPRESSION: 1. Mildly motion degraded exam. 2.  No evidence of pulmonary embolism. 3. Multifocal right-sided airspace disease is consistent with pneumonia. 4. Bilateral pulmonary nodules, maximally 8 mm. Per consensus criteria, potential clinical strategies include followup CT, PET-CT, or tissue sampling at 3 months. This recommendation follows the consensus statement: Guidelines for Management of Incidental Pulmonary Nodules Detected on CT Images:From the Fleischner Society 2017; published online before print (10.1148/radiol.1610960454). 5.  Coronary artery atherosclerosis.  Aortic atherosclerosis. 6.  Possible constipation. 7. Upper abdominal ascites, including adjacent the gallbladder. No specific evidence of acute cholecystitis. If there are right upper quadrant symptoms, consider right upper quadrant ultrasound. 8. Small right pleural effusion. 9. Small hiatal hernia. Electronically Signed   By: Jeronimo Greaves M.D.   On: 09/25/2016 11:49     Management plans discussed with the patient, family and they are in agreement.  CODE STATUS:     Code Status Orders        Start     Ordered   09/24/16 1611  Full code  Continuous     09/24/16 1610    Code Status History    Date Active Date Inactive Code Status Order ID Comments User Context   09/16/2016  9:13 PM 09/23/2016  5:20 PM Full Code [id number]  Enid Baas, MD ED      TOTAL TIME TAKING CARE OF THIS PATIENT: 40 minutes.    Delmus Warwick M.D on 09/26/2016 at 5:24 PM  Between 7am to 6pm - Pager - [phone] After 6pm go to [email or web address] - password [email or web address] [facility]  [facility]  Office  [phone]  CC: Primary care physician; Leanna Sato, MD

## 2016-09-26 NOTE — Progress Notes (Signed)
PT Cancellation Note  Patient Details Name: Jim [name] MRN: [id number] DOB: 29-Jan-1930   Cancelled Treatment:    Reason Eval/Treat Not Completed: Patient at procedure or test/unavailable   Session attempted but he had just received enema.   Danielle DessSarah Akeya Ryther 09/26/2016, 4:37 PM

## 2016-09-26 NOTE — Progress Notes (Signed)
[employer] authorization has been received. Auth # [id number]. Patient is medically stable for D/C to [facility] today. Per Jomarie Longs[facility] liaison patient will go to room 804. RN will call report to [address] RN at [phone] and arrange EMS for transport. Clinical Child psychotherapistocial Worker (CSW) will send D/C orders when they are available. CSW contacted patient's son [name] and made him aware of above. Please reconsult if future social work needs arise. CSW signing off.   [employer]iley Adi Seales, LCSW [phone]

## 2016-09-26 NOTE — Progress Notes (Signed)
Clinical Child psychotherapistocial Worker (CSW) received a voicemail this morning from [employer] [employer] case manager asking, which SNF patient will be going to if approved. Noreene LarssonJill also stated that a peer to peer would have to be done to pursue approval because there is no information about patient's ADL's in the clinicals. Noreene LarssonJill requested for CSW to call back with family's SNF choice and name and number of physician who will do peer to peer. CSW paged attending MD to discuss peer to peer. CSW contacted patient's son Jim [name] and made him aware of above. CSW also presented bed offers. Jim [name] chose [facility]. [facility] liaison is aware of above. CSW will continue to follow and assist as needed.   [employer]iley Aalliyah Kilker, LCSW [phone]

## 2016-09-26 NOTE — Care Management Important Message (Signed)
Important Message  Patient Details  Name: Jim [name] MRN: [id number] Date of Birth: [birth year]   Medicare Important Message Given:  Yes    Collie Siadngela Ruhan Borak, RN 09/26/2016, 11:27 AM

## 2016-09-26 NOTE — Progress Notes (Addendum)
[facility] [place] at [facility]   PATIENT NAME: Jim [name]    MR#:  [id number]  DATE OF BIRTH:  28-Jun-1930  SUBJECTIVE:  Feels qweak  REVIEW OF SYSTEMS:   Review of Systems  Constitutional: Negative for chills, fever and weight loss.  HENT: Negative for ear discharge, ear pain and nosebleeds.   Eyes: Negative for blurred vision, pain and discharge.  Respiratory: Negative for sputum production, shortness of breath, wheezing and stridor.   Cardiovascular: Negative for chest pain, palpitations, orthopnea and PND.  Gastrointestinal: Negative for abdominal pain, diarrhea, nausea and vomiting.  Genitourinary: Negative for frequency and urgency.  Musculoskeletal: Negative for back pain and joint pain.  Neurological: Positive for weakness. Negative for sensory change, speech change and focal weakness.  Psychiatric/Behavioral: Negative for depression and hallucinations. The patient is not nervous/anxious.    Tolerating Diet:yesTolerating PT: rehab  DRUG ALLERGIES:   Allergies  Allergen Reactions  . Penicillins Rash    VITALS:  Blood pressure (!) 126/56, pulse 85, temperature 98.1 F (36.7 C), temperature source Oral, resp. rate 18, height 6\' 1"  (1.854 m), weight 72.8 kg (160 lb 8 oz), SpO2 95 %.  PHYSICAL EXAMINATION:   Physical Exam  GENERAL:  82 y.o.-year-old patient lying in the bed with no acute distress.  EYES: Pupils equal, round, reactive to light and accommodation. No scleral icterus. Extraocular muscles intact.  HEENT: Head atraumatic, normocephalic. Oropharynx and nasopharynx clear.  NECK:  Supple, no jugular venous distention. No thyroid enlargement, no tenderness.  LUNGS: Normal breath sounds bilaterally, no wheezing, rales, rhonchi. No use of accessory muscles of respiration.  CARDIOVASCULAR: S1, S2 normal. No murmurs, rubs, or gallops.  ABDOMEN: Soft, nontender, nondistended. Bowel sounds present. No organomegaly or mass.   EXTREMITIES: No cyanosis, clubbing or edema b/l.   Left BKA ,chronic ulcers on the right heel NEUROLOGIC: Cranial nerves II through XII are intact. No focal Motor or sensory deficits b/l.   PSYCHIATRIC:  patient is alert and oriented x 3.  SKIN: No obvious rash, lesion, or ulcer.   LABORATORY PANEL:  CBC  Recent Labs Lab 09/24/16 1132  WBC 9.8  HGB 15.6  HCT 45.7  PLT 193    Chemistries   Recent Labs Lab 09/24/16 1132 09/24/16 1824  NA 139  --   K 3.9  --   CL 108  --   CO2 23  --   GLUCOSE 270*  --   BUN 13  --   CREATININE 1.09 1.14  CALCIUM 8.0*  --   AST 12*  --   ALT 9*  --   ALKPHOS 65  --   BILITOT 1.1  --    Cardiac Enzymes  Recent Labs Lab 09/24/16 1824  TROPONINI 0.07*   RADIOLOGY:  Ct Angio Chest Pe W Or Wo Contrast  Result Date: 09/25/2016 CLINICAL DATA:  Sepsis and pneumonia. Hypertension and diabetes. Shortness of breath. EXAM: CT ANGIOGRAPHY CHEST WITH CONTRAST TECHNIQUE: Multidetector CT imaging of the chest was performed using the standard protocol during bolus administration of intravenous contrast. Multiplanar CT image reconstructions and MIPs were obtained to evaluate the vascular anatomy. CONTRAST:  75 cc of Isovue 370 COMPARISON:  chest radiograph 09/24/2016.  No prior CT. FINDINGS: Cardiovascular: The quality of this exam for evaluation of pulmonary embolism is good. The bolus is well timed. The primary limitation is below described motion. No evidence of pulmonary embolism. Aortic and branch vessel atherosclerosis. No aneurysm. Suboptimal evaluation for dissection secondary to bolus  timing. Moderate cardiomegaly. Multivessel coronary artery atherosclerosis. Mediastinum/Nodes: No mediastinal or hilar adenopathy. Small hiatal hernia. Lungs/Pleura: Small right-sided pleural effusion. Trace left pleural fluid or thickening. Moderate centrilobular emphysema. 8 mm nodular density in the right upper lobe on image 35/ series 6. Right lower and right  middle lobe patchy airspace disease is peripheral predominant. Minimal inferior right upper lobe airspace disease. Subpleural left upper lobe 7 mm nodule on image 61/ series 6. Upper Abdomen: Normal imaged portions of the liver, spleen, right adrenal gland. Left adrenal thickening. Right renal vascular calcification. Upper pole left renal 2.8 cm cyst. Abdominal aortic and branch vessel atherosclerosis. Large colonic stool burden. Small volume right upper quadrant ascites. Fluid is seen adjacent to the gallbladder including on image 94/series 4. Musculoskeletal: Moderate thoracic spondylosis. Review of the MIP images confirms the above findings. IMPRESSION: 1. Mildly motion degraded exam. 2.  No evidence of pulmonary embolism. 3. Multifocal right-sided airspace disease is consistent with pneumonia. 4. Bilateral pulmonary nodules, maximally 8 mm. Per consensus criteria, potential clinical strategies include followup CT, PET-CT, or tissue sampling at 3 months. This recommendation follows the consensus statement: Guidelines for Management of Incidental Pulmonary Nodules Detected on CT Images:From the Fleischner Society 2017; published online before print (10.1148/radiol.[id number]). 5.  Coronary artery atherosclerosis. Aortic atherosclerosis. 6.  Possible constipation. 7. Upper abdominal ascites, including adjacent the gallbladder. No specific evidence of acute cholecystitis. If there are right upper quadrant symptoms, consider right upper quadrant ultrasound. 8. Small right pleural effusion. 9. Small hiatal hernia. Electronically Signed   By: Jeronimo GreavesKyle  Talbot M.D.   On: 09/25/2016 11:49   Dg Abdomen Acute W/chest  Result Date: 09/24/2016 CLINICAL DATA:  Weakness, abdominal pain EXAM: DG ABDOMEN ACUTE W/ 1V CHEST COMPARISON:  09/18/2016 FINDINGS: Cardiomediastinal silhouette is stable. There is worsening pneumonia in right lower lobe and right middle lobe. No pulmonary edema. Abundant stool noted in right colon.  Moderate gas noted in splenic flexure of the colon and proximal left colon. Mild gaseous distended small bowel loops mid abdomen suspicious for ileus or Dontavious bowel obstruction. Paucity of bowel gas within pelvis. No evidence of free abdominal air. IMPRESSION: Worsening pneumonia/infiltrate in right lower lobe and right middle lobe. No pulmonary edema. Abundant stool noted in right colon. Moderate gas noted in splenic flexure of the colon. Mild gaseous distended small bowel loops mid abdomen suspicious for ileus or Eyden bowel obstruction. Paucity of bowel gas within pelvis. Electronically Signed   By: Natasha MeadLiviu  Pop M.D.   On: 09/24/2016 12:56   ASSESSMENT AND PLAN:  82 year old recently hospitalized now readmitted after being at home for 1 day  1. Worsening pneumonia (HCAP) Continue broad-spectrum antibiotics pt was on Vanc and Zosyn---change to po augmentin -wbc 9.8 -no fever -BC negative  2. Elevated troponin. due to demanding ischemia. -no cp  3. Hypertension. Blood pressure is under control without medication.  4. Diabetes.lanuts 5 units daily ssi  5. Gen deconditioning Due to pt's current illness he is deconditioned to the point he is not able to [facility] and [facility] He will definitely benefit from rehab to get him at his baseline functioning where he can do his ADLS  CM working for d/c planning  Case discussed with Care Management/Social Worker. Management plans discussed with the patient, family and they are in agreement.  CODE STATUS: FUll  DVT Prophylaxis:lovenox  TOTAL TIME TAKING CARE OF THIS PATIENT: 40minutes.  >50% time spent on counselling and coordination of care  POSSIBLE D/C IN 1-2 DAYS, DEPENDING ON CLINICAL CONDITION.  Note: This dictation was prepared with Dragon dictation along with smaller phrase technology. Any transcriptional errors that result from this process are unintentional.  Zack Crager M.D on 09/26/2016 at 9:49  AM  Between 7am to 6pm - Pager - [phone]  After 6pm go to [email or web address] - password [email or web address] [facility]  [facility]  Office  [phone]  CC: Primary care physician; Leanna Sato, MD

## 2016-09-26 NOTE — Progress Notes (Signed)
Patient requested advanced directives which was delivered do him by the chaplain on call. This visit was a follow-up of the same. Patient was not mental stable to be able to execute the advance directives. As a result, the chaplain just prayed with paitient and the sister of the patient's late wife.

## 2016-09-29 LAB — CULTURE, BLOOD (ROUTINE X 2)
CULTURE: NO GROWTH
CULTURE: NO GROWTH

## 2016-10-02 ENCOUNTER — Inpatient Hospital Stay
Admission: EM | Admit: 2016-10-02 | Discharge: 2016-10-07 | DRG: 683 | Disposition: A | Discharge status: Skilled Nursing Facility | Payer: Medicare HMO | Attending: Internal Medicine | Admitting: Internal Medicine

## 2016-10-02 DIAGNOSIS — N179 Acute kidney failure, unspecified: Principal | ICD-10-CM

## 2016-10-02 DIAGNOSIS — Z89511 Acquired absence of right leg below knee: Secondary | ICD-10-CM

## 2016-10-02 DIAGNOSIS — I10 Essential (primary) hypertension: Secondary | ICD-10-CM | POA: Diagnosis present

## 2016-10-02 DIAGNOSIS — G8929 Other chronic pain: Secondary | ICD-10-CM | POA: Diagnosis present

## 2016-10-02 DIAGNOSIS — I959 Hypotension, unspecified: Secondary | ICD-10-CM | POA: Diagnosis present

## 2016-10-02 DIAGNOSIS — N139 Obstructive and reflux uropathy, unspecified: Secondary | ICD-10-CM | POA: Diagnosis not present

## 2016-10-02 DIAGNOSIS — L539 Erythematous condition, unspecified: Secondary | ICD-10-CM | POA: Diagnosis present

## 2016-10-02 DIAGNOSIS — Z87891 Personal history of nicotine dependence: Secondary | ICD-10-CM

## 2016-10-02 DIAGNOSIS — F329 Major depressive disorder, single episode, unspecified: Secondary | ICD-10-CM | POA: Diagnosis present

## 2016-10-02 DIAGNOSIS — M6281 Muscle weakness (generalized): Secondary | ICD-10-CM

## 2016-10-02 DIAGNOSIS — N3001 Acute cystitis with hematuria: Secondary | ICD-10-CM | POA: Diagnosis present

## 2016-10-02 DIAGNOSIS — Y92239 Unspecified place in hospital as the place of occurrence of the external cause: Secondary | ICD-10-CM | POA: Diagnosis not present

## 2016-10-02 DIAGNOSIS — Z794 Long term (current) use of insulin: Secondary | ICD-10-CM

## 2016-10-02 DIAGNOSIS — Z880 Allergy status to penicillin: Secondary | ICD-10-CM

## 2016-10-02 DIAGNOSIS — Z7982 Long term (current) use of aspirin: Secondary | ICD-10-CM

## 2016-10-02 DIAGNOSIS — Y738 Miscellaneous gastroenterology and urology devices associated with adverse incidents, not elsewhere classified: Secondary | ICD-10-CM | POA: Diagnosis not present

## 2016-10-02 DIAGNOSIS — T83098A Other mechanical complication of other indwelling urethral catheter, initial encounter: Secondary | ICD-10-CM | POA: Diagnosis not present

## 2016-10-02 DIAGNOSIS — J189 Pneumonia, unspecified organism: Secondary | ICD-10-CM

## 2016-10-02 DIAGNOSIS — R262 Difficulty in walking, not elsewhere classified: Secondary | ICD-10-CM

## 2016-10-02 DIAGNOSIS — E1165 Type 2 diabetes mellitus with hyperglycemia: Secondary | ICD-10-CM | POA: Diagnosis present

## 2016-10-02 DIAGNOSIS — Z66 Do not resuscitate: Secondary | ICD-10-CM | POA: Diagnosis present

## 2016-10-02 DIAGNOSIS — E872 Acidosis: Secondary | ICD-10-CM | POA: Diagnosis present

## 2016-10-02 DIAGNOSIS — Z515 Encounter for palliative care: Secondary | ICD-10-CM | POA: Diagnosis present

## 2016-10-02 DIAGNOSIS — N401 Enlarged prostate with lower urinary tract symptoms: Secondary | ICD-10-CM | POA: Diagnosis present

## 2016-10-02 DIAGNOSIS — E875 Hyperkalemia: Secondary | ICD-10-CM | POA: Diagnosis present

## 2016-10-02 LAB — CBC WITH DIFFERENTIAL/PLATELET
BASOS ABS: 0.1 10*3/uL (ref 0–0.1)
Basophils Relative: 1 %
Eosinophils Absolute: 0 10*3/uL (ref 0–0.7)
Eosinophils Relative: 0 %
HEMATOCRIT: 41.8 % (ref 40.0–52.0)
Hemoglobin: 14.2 g/dL (ref 13.0–18.0)
LYMPHS PCT: 4 %
Lymphs Abs: 0.6 10*3/uL — ABNORMAL LOW (ref 1.0–3.6)
MCH: 29.1 pg (ref 26.0–34.0)
MCHC: 34 g/dL (ref 32.0–36.0)
MCV: 85.5 fL (ref 80.0–100.0)
Monocytes Absolute: 0.8 10*3/uL (ref 0.2–1.0)
Monocytes Relative: 5 %
NEUTROS ABS: 13.1 10*3/uL — AB (ref 1.4–6.5)
Neutrophils Relative %: 90 %
Platelets: 321 10*3/uL (ref 150–440)
RBC: 4.89 MIL/uL (ref 4.40–5.90)
RDW: 17.1 % — ABNORMAL HIGH (ref 11.5–14.5)
WBC: 14.5 10*3/uL — AB (ref 3.8–10.6)

## 2016-10-02 LAB — URINALYSIS COMPLETE WITH MICROSCOPIC (ARMC ONLY)
BACTERIA UA: NONE SEEN
Bilirubin Urine: NEGATIVE
Glucose, UA: 50 mg/dL — AB
Leukocytes, UA: NEGATIVE
NITRITE: NEGATIVE
PROTEIN: 100 mg/dL — AB
SQUAMOUS EPITHELIAL / LPF: NONE SEEN
Specific Gravity, Urine: 1.014 (ref 1.005–1.030)
pH: 5 (ref 5.0–8.0)

## 2016-10-02 LAB — COMPREHENSIVE METABOLIC PANEL
ALK PHOS: 84 U/L (ref 38–126)
ALT: 7 U/L — AB (ref 17–63)
AST: 13 U/L — ABNORMAL LOW (ref 15–41)
Albumin: 2.2 g/dL — ABNORMAL LOW (ref 3.5–5.0)
Anion gap: 17 — ABNORMAL HIGH (ref 5–15)
BILIRUBIN TOTAL: 1.2 mg/dL (ref 0.3–1.2)
BUN: 73 mg/dL — ABNORMAL HIGH (ref 6–20)
CALCIUM: 8 mg/dL — AB (ref 8.9–10.3)
CO2: 19 mmol/L — AB (ref 22–32)
CREATININE: 4.98 mg/dL — AB (ref 0.61–1.24)
Chloride: 101 mmol/L (ref 101–111)
GFR calc non Af Amer: 10 mL/min — ABNORMAL LOW (ref >60)
GFR, EST AFRICAN AMERICAN: 11 mL/min — AB (ref >60)
Glucose, Bld: 346 mg/dL — ABNORMAL HIGH (ref 65–99)
Potassium: 6.1 mmol/L — ABNORMAL HIGH (ref 3.5–5.1)
SODIUM: 137 mmol/L (ref 135–145)
Total Protein: 6.5 g/dL (ref 6.5–8.1)

## 2016-10-02 LAB — LACTIC ACID, PLASMA: Lactic Acid, Venous: 1.8 mmol/L (ref 0.5–1.9)

## 2016-10-02 MED ORDER — DEXTROSE 5 % IV SOLN
1.0000 g | Freq: Once | INTRAVENOUS | Status: AC
  Administered 2016-10-02: 1 g via INTRAVENOUS
  Filled 2016-10-02: qty 10

## 2016-10-02 MED ORDER — SODIUM CHLORIDE 0.9 % IV SOLN
1.0000 g | Freq: Once | INTRAVENOUS | Status: AC
  Administered 2016-10-02: 1 g via INTRAVENOUS
  Filled 2016-10-02: qty 10

## 2016-10-02 NOTE — ED Notes (Signed)
EDP notified of patient's bladder scan results and foley being placed.  EDP also notified of amount of urine drained from patient's bladder at this time.  EDP also notified that pt's blood drawn, but that IV unable to be obtained at this time.

## 2016-10-02 NOTE — ED Notes (Signed)
Pt's son is leaving.  Phone number to reach him is: [phone].  Son's name is Frederich Baldingimothy Melgoza.

## 2016-10-02 NOTE — ED Triage Notes (Signed)
Per Elyn AquasPat Boyken, pt sent to ER because of labs per Dr. Terance HartBronstein.  Per [facility] pt has a DNR, but they are unable to find chart.

## 2016-10-02 NOTE — ED Notes (Signed)
Son, [name] arrived at bedside.  EDP notified that son has power of attorney and confirms that pt is a DNR at this time.  Pt unable to answer questions at this time.  Son assisted with triage.

## 2016-10-02 NOTE — ED Provider Notes (Signed)
[facility] Emergency Department Provider Note    ____________________________________________   I have reviewed the triage vital signs and the nursing notes.   HISTORY  Chief Complaint Acute Renal Failure   History limited by: Poor historian, some history obtained from son   HPI Jim [name] is a 82 y.o. male who presents to the emergency department today from rehabilitation facility because of concerns for acute kidney injury. Patient was recently discharged from the hospital of pneumonia. Since going to rehabilitation it sounds like he is declined. Blood work sent from rehabilitation showed acute kidney injury. The patient has also noticed some swelling primarily to his upper extremities. In addition the patient complains of diffuse pain all throughout his body. No fever since going to rehabilitation.    Past Medical History:  Diagnosis Date  . Diabetes mellitus without complication (HCC)   . Hypertension   . Right foot ulcer [facility](HCC)     Patient Active Problem List   Diagnosis Date Noted  . Pneumonia 09/24/2016  . Pressure injury of skin 09/17/2016  . Sepsis (HCC) 09/16/2016    Past Surgical History:  Procedure Laterality Date  . LEG AMPUTATION BELOW KNEE  1967   RIGHT    Prior to Admission medications   Medication Sig Start Date End Date Taking? Authorizing Provider  aspirin EC 81 MG tablet Take 81 mg by mouth daily.   Yes Historical Provider, MD  fluconazole (DIFLUCAN) 100 MG tablet Take 100 mg by mouth daily. 10/01/16 10/08/16 Yes Historical Provider, MD  LANTUS 100 UNIT/ML injection Inject 5 Units into the skin 2 (two) times daily.    Yes Historical Provider, MD  oxyCODONE (ROXICODONE) 15 MG immediate release tablet Take 0.5 tablets (7.5 mg total) by mouth every 4 (four) hours as needed for moderate pain. Patient taking differently: Take 7.5 mg by mouth every 6 (six) hours as needed for pain.  09/26/16  Yes Wyatt Hasteavid K Hower, MD  polyethylene  glycol [facility](MIRALAX / GLYCOLAX) packet Take 17 g by mouth daily. Patient taking differently: Take 17 g by mouth 2 (two) times daily.  09/27/16  Yes Enedina FinnerSona Patel, MD  sennosides-docusate sodium (SENOKOT-S) 8.6-50 MG tablet Take 1 tablet by mouth 2 (two) times daily.   Yes Historical Provider, MD  amoxicillin-clavulanate (AUGMENTIN) 875-125 MG tablet Take 1 tablet by mouth every 12 (twelve) hours. 09/26/16   Enedina FinnerSona Patel, MD  aspirin 325 MG tablet Take 1 tablet (325 mg total) by mouth daily. Patient not taking: Reported on 10/02/2016 09/27/16   Enedina FinnerSona Patel, MD    Allergies Penicillins  No family history on file.  Social History Social History  Substance Use Topics  . Smoking status: Former Smoker    Types: Cigarettes    Quit date: 12/30/1995  . Smokeless tobacco: Never Used  . Alcohol use No    Review of Systems  Constitutional: Negative for fever. Cardiovascular: Negative for chest pain. Respiratory: Negative for shortness of breath. Gastrointestinal: Negative for abdominal pain, vomiting and diarrhea. Neurological: Negative for headaches, focal weakness or numbness.  10-point ROS otherwise negative.  ____________________________________________   PHYSICAL EXAM:  VITAL SIGNS: ED Triage Vitals  Enc Vitals Group     BP 10/02/16 2011 135/77     Pulse Rate 10/02/16 2011 95     Resp 10/02/16 2011 (!) 22     Temp 10/02/16 2011 97.4 F (36.3 C)     Temp Source 10/02/16 2011 Oral     SpO2 10/02/16 2011 96 %  Weight --      Height --      Head Circumference --      [facility] Flow --      Pain Score 10/02/16 2033 Asleep   Constitutional: Awake and alert. No acute distress. Eyes: Conjunctivae are normal. Normal extraocular movements. ENT   Head: Normocephalic and atraumatic.   Nose: No congestion/rhinnorhea.   Mouth/Throat: Mucous membranes are moist.   Neck: No stridor. Hematological/Lymphatic/Immunilogical: No cervical lymphadenopathy. Cardiovascular: Normal rate,  regular rhythm.  No murmurs, rubs, or gallops. Respiratory: Normal respiratory effort without tachypnea nor retractions. Breath sounds are clear and equal bilaterally. No wheezes/rales/rhonchi. Gastrointestinal: Soft and nontender. No distention.  Genitourinary: Deferred Musculoskeletal: Normal range of motion in all extremities. Edema to the extremities. Left AKA. Neurologic:  Normal speech and language. No gross focal neurologic deficits are appreciated.  Skin:  Skin is warm, dry and intact. No rash noted.   ____________________________________________    LABS (pertinent positives/negatives)  Labs Reviewed  COMPREHENSIVE METABOLIC PANEL - Abnormal; Notable for the following:       Result Value   Potassium 6.1 (*)    CO2 19 (*)    Glucose, Bld 346 (*)    BUN 73 (*)    Creatinine, Ser 4.98 (*)    Calcium 8.0 (*)    Albumin 2.2 (*)    AST 13 (*)    ALT 7 (*)    GFR calc non Af Amer 10 (*)    GFR calc Af Amer 11 (*)    Anion gap 17 (*)    All other components within normal limits  URINALYSIS COMPLETEWITH MICROSCOPIC ([facility] ONLY) - Abnormal; Notable for the following:    Color, Urine RED (*)    APPearance CLOUDY (*)    Glucose, UA 50 (*)    Ketones, ur TRACE (*)    Hgb urine dipstick 3+ (*)    Protein, ur 100 (*)    All other components within normal limits  CBC WITH DIFFERENTIAL/PLATELET - Abnormal; Notable for the following:    WBC 14.5 (*)    RDW 17.1 (*)    Neutro Abs 13.1 (*)    Lymphs Abs 0.6 (*)    All other components within normal limits  LACTIC ACID, PLASMA  SODIUM, URINE, RANDOM  CREATININE, URINE, RANDOM     ____________________________________________   EKG  I, Phineas Semen, attending physician, personally viewed and interpreted this EKG  EKG Time: 2010 Rate: 104 Rhythm: junctional tachycardia Axis: left axis deviation Intervals: qtc 562 QRS: RBBB, LAFB ST changes: no st elevation Impression: abnormal  ekg  ____________________________________________    RADIOLOGY  None  ____________________________________________   PROCEDURES  Procedures  ____________________________________________   INITIAL IMPRESSION / ASSESSMENT AND PLAN / ED COURSE  Pertinent labs & imaging results that were available during my care of the patient were reviewed by me and considered in my medical decision making (see chart for details).  Patient presents to the emergency department today because of concerns for acute kidney injury. Bladder scan showed greater than 900 mL. After Foley catheter was placed greater than 1500 mL was removed. I do wonder if part of the acute kidney injury thus is due to obstruction. Patient's urine was concerning for urinary tract infection will place patient on antibiotic. Will plan on admission to hospital service. ____________________________________________   FINAL CLINICAL IMPRESSION(S) / ED DIAGNOSES  Final diagnoses:  AKI (acute kidney injury) (HCC)     Note: This dictation was prepared with Dragon dictation.  Any transcriptional errors that result from this process are unintentional    Phineas Semen, MD 10/02/16 2318

## 2016-10-03 ENCOUNTER — Inpatient Hospital Stay: Payer: Medicare HMO

## 2016-10-03 DIAGNOSIS — N401 Enlarged prostate with lower urinary tract symptoms: Secondary | ICD-10-CM | POA: Diagnosis present

## 2016-10-03 DIAGNOSIS — Z794 Long term (current) use of insulin: Secondary | ICD-10-CM | POA: Diagnosis not present

## 2016-10-03 DIAGNOSIS — I10 Essential (primary) hypertension: Secondary | ICD-10-CM | POA: Diagnosis present

## 2016-10-03 DIAGNOSIS — Z87891 Personal history of nicotine dependence: Secondary | ICD-10-CM | POA: Diagnosis not present

## 2016-10-03 DIAGNOSIS — M6281 Muscle weakness (generalized): Secondary | ICD-10-CM | POA: Diagnosis not present

## 2016-10-03 DIAGNOSIS — Z66 Do not resuscitate: Secondary | ICD-10-CM | POA: Diagnosis not present

## 2016-10-03 DIAGNOSIS — T83098A Other mechanical complication of other indwelling urethral catheter, initial encounter: Secondary | ICD-10-CM | POA: Diagnosis not present

## 2016-10-03 DIAGNOSIS — Z7982 Long term (current) use of aspirin: Secondary | ICD-10-CM | POA: Diagnosis not present

## 2016-10-03 DIAGNOSIS — G8929 Other chronic pain: Secondary | ICD-10-CM | POA: Diagnosis present

## 2016-10-03 DIAGNOSIS — F329 Major depressive disorder, single episode, unspecified: Secondary | ICD-10-CM | POA: Diagnosis present

## 2016-10-03 DIAGNOSIS — E1165 Type 2 diabetes mellitus with hyperglycemia: Secondary | ICD-10-CM | POA: Diagnosis present

## 2016-10-03 DIAGNOSIS — N179 Acute kidney failure, unspecified: Secondary | ICD-10-CM | POA: Diagnosis present

## 2016-10-03 DIAGNOSIS — L539 Erythematous condition, unspecified: Secondary | ICD-10-CM | POA: Diagnosis present

## 2016-10-03 DIAGNOSIS — E872 Acidosis: Secondary | ICD-10-CM | POA: Diagnosis present

## 2016-10-03 DIAGNOSIS — Y92239 Unspecified place in hospital as the place of occurrence of the external cause: Secondary | ICD-10-CM | POA: Diagnosis not present

## 2016-10-03 DIAGNOSIS — N139 Obstructive and reflux uropathy, unspecified: Secondary | ICD-10-CM | POA: Diagnosis not present

## 2016-10-03 DIAGNOSIS — Z515 Encounter for palliative care: Secondary | ICD-10-CM | POA: Diagnosis not present

## 2016-10-03 DIAGNOSIS — E875 Hyperkalemia: Secondary | ICD-10-CM | POA: Diagnosis present

## 2016-10-03 DIAGNOSIS — Z89511 Acquired absence of right leg below knee: Secondary | ICD-10-CM | POA: Diagnosis not present

## 2016-10-03 DIAGNOSIS — Z880 Allergy status to penicillin: Secondary | ICD-10-CM | POA: Diagnosis not present

## 2016-10-03 DIAGNOSIS — N3001 Acute cystitis with hematuria: Secondary | ICD-10-CM | POA: Diagnosis present

## 2016-10-03 DIAGNOSIS — I959 Hypotension, unspecified: Secondary | ICD-10-CM | POA: Diagnosis present

## 2016-10-03 DIAGNOSIS — Y738 Miscellaneous gastroenterology and urology devices associated with adverse incidents, not elsewhere classified: Secondary | ICD-10-CM | POA: Diagnosis not present

## 2016-10-03 LAB — GLUCOSE, CAPILLARY
Glucose-Capillary: 175 mg/dL — ABNORMAL HIGH (ref 65–99)
Glucose-Capillary: 134 mg/dL — ABNORMAL HIGH (ref 65–99)
Glucose-Capillary: 203 mg/dL — ABNORMAL HIGH (ref 65–99)
Glucose-Capillary: 184 mg/dL — ABNORMAL HIGH (ref 65–99)
Glucose-Capillary: 271 mg/dL — ABNORMAL HIGH (ref 65–99)

## 2016-10-03 LAB — BASIC METABOLIC PANEL
ANION GAP: 4 — AB (ref 5–15)
BUN: 34 mg/dL — ABNORMAL HIGH (ref 6–20)
CALCIUM: 7.7 mg/dL — AB (ref 8.9–10.3)
CO2: 29 mmol/L (ref 22–32)
Chloride: 108 mmol/L (ref 101–111)
Creatinine, Ser: 1.52 mg/dL — ABNORMAL HIGH (ref 0.61–1.24)
GFR, EST AFRICAN AMERICAN: 47 mL/min — AB (ref >60)
GFR, EST NON AFRICAN AMERICAN: 41 mL/min — AB (ref >60)
GLUCOSE: 181 mg/dL — AB (ref 65–99)
POTASSIUM: 4.3 mmol/L (ref 3.5–5.1)
SODIUM: 141 mmol/L (ref 135–145)

## 2016-10-03 LAB — TSH: TSH: 3.359 u[IU]/mL (ref 0.350–4.500)

## 2016-10-03 LAB — CREATININE, URINE, RANDOM: CREATININE, URINE: 60 mg/dL

## 2016-10-03 LAB — SODIUM, URINE, RANDOM: Sodium, Ur: 34 mmol/L

## 2016-10-03 MED ORDER — INSULIN ASPART 100 UNIT/ML ~~LOC~~ SOLN
0.0000 [IU] | Freq: Three times a day (TID) | SUBCUTANEOUS | Status: DC
  Administered 2016-10-03: 2 [IU] via SUBCUTANEOUS
  Administered 2016-10-04: 3 [IU] via SUBCUTANEOUS
  Administered 2016-10-04: 2 [IU] via SUBCUTANEOUS
  Administered 2016-10-04: 5 [IU] via SUBCUTANEOUS
  Administered 2016-10-05: 7 [IU] via SUBCUTANEOUS
  Administered 2016-10-05: 2 [IU] via SUBCUTANEOUS
  Administered 2016-10-05: 5 [IU] via SUBCUTANEOUS
  Administered 2016-10-06 (×2): 3 [IU] via SUBCUTANEOUS
  Administered 2016-10-06: 2 [IU] via SUBCUTANEOUS
  Administered 2016-10-07: 1 [IU] via SUBCUTANEOUS
  Filled 2016-10-03: qty 3
  Filled 2016-10-03: qty 5
  Filled 2016-10-03 (×2): qty 2
  Filled 2016-10-03: qty 5
  Filled 2016-10-03: qty 2
  Filled 2016-10-03: qty 3
  Filled 2016-10-03: qty 2
  Filled 2016-10-03: qty 7
  Filled 2016-10-03 (×2): qty 1
  Filled 2016-10-03: qty 3

## 2016-10-03 MED ORDER — OXYCODONE HCL 5 MG PO TABS
7.5000 mg | ORAL_TABLET | ORAL | Status: DC | PRN
  Administered 2016-10-03 – 2016-10-06 (×12): 7.5 mg via ORAL
  Filled 2016-10-03 (×12): qty 2

## 2016-10-03 MED ORDER — ONDANSETRON HCL 4 MG PO TABS: 4.0000 mg | ORAL_TABLET | Freq: Four times a day (QID) | ORAL | Status: DC | PRN

## 2016-10-03 MED ORDER — SENNA-DOCUSATE SODIUM 8.6-50 MG PO TABS: 1.0000 | ORAL_TABLET | Freq: Two times a day (BID) | ORAL | Status: DC

## 2016-10-03 MED ORDER — HEPARIN SODIUM (PORCINE) 5000 UNIT/ML IJ SOLN
5000.0000 [IU] | Freq: Three times a day (TID) | INTRAMUSCULAR | Status: DC
  Administered 2016-10-03 – 2016-10-04 (×4): 5000 [IU] via SUBCUTANEOUS
  Filled 2016-10-03 (×4): qty 1

## 2016-10-03 MED ORDER — INSULIN ASPART 100 UNIT/ML ~~LOC~~ SOLN
0.0000 [IU] | Freq: Three times a day (TID) | SUBCUTANEOUS | Status: DC
Start: 2016-10-03 — End: 2016-10-03
  Administered 2016-10-03: 1 [IU] via SUBCUTANEOUS
  Administered 2016-10-03: 3 [IU] via SUBCUTANEOUS
  Filled 2016-10-03: qty 3
  Filled 2016-10-03: qty 1

## 2016-10-03 MED ORDER — ONDANSETRON HCL 4 MG/2ML IJ SOLN
4.0000 mg | Freq: Four times a day (QID) | INTRAMUSCULAR | Status: DC | PRN
  Administered 2016-10-03: 4 mg via INTRAVENOUS
  Filled 2016-10-03: qty 2

## 2016-10-03 MED ORDER — FINASTERIDE 5 MG PO TABS
5.0000 mg | ORAL_TABLET | Freq: Every day | ORAL | Status: DC
  Administered 2016-10-03 – 2016-10-06 (×4): 5 mg via ORAL
  Filled 2016-10-03 (×4): qty 1

## 2016-10-03 MED ORDER — ACETAMINOPHEN 650 MG RE SUPP: 650.0000 mg | Freq: Four times a day (QID) | RECTAL | Status: DC | PRN

## 2016-10-03 MED ORDER — ENSURE ENLIVE PO LIQD
237.0000 mL | Freq: Two times a day (BID) | ORAL | Status: DC
  Administered 2016-10-03 – 2016-10-06 (×8): 237 mL via ORAL

## 2016-10-03 MED ORDER — ASPIRIN EC 81 MG PO TBEC
81.0000 mg | DELAYED_RELEASE_TABLET | Freq: Every day | ORAL | Status: DC
  Administered 2016-10-03 – 2016-10-04 (×2): 81 mg via ORAL
  Filled 2016-10-03 (×2): qty 1

## 2016-10-03 MED ORDER — TAMSULOSIN HCL 0.4 MG PO CAPS
0.4000 mg | ORAL_CAPSULE | Freq: Every day | ORAL | Status: DC
  Administered 2016-10-03 – 2016-10-06 (×4): 0.4 mg via ORAL
  Filled 2016-10-03 (×4): qty 1

## 2016-10-03 MED ORDER — SODIUM CHLORIDE 0.9 % IV SOLN
INTRAVENOUS | Status: DC
  Administered 2016-10-03 (×2): via INTRAVENOUS
  Administered 2016-10-03: 1000 mL via INTRAVENOUS
  Administered 2016-10-04: 07:00:00 via INTRAVENOUS

## 2016-10-03 MED ORDER — INSULIN GLARGINE 100 UNIT/ML ~~LOC~~ SOLN
5.0000 [IU] | Freq: Two times a day (BID) | SUBCUTANEOUS | Status: DC
  Administered 2016-10-03: 5 [IU] via SUBCUTANEOUS
  Filled 2016-10-03 (×2): qty 0.05

## 2016-10-03 MED ORDER — FLUCONAZOLE 100 MG PO TABS
100.0000 mg | ORAL_TABLET | Freq: Every day | ORAL | Status: DC
  Administered 2016-10-03 – 2016-10-06 (×4): 100 mg via ORAL
  Filled 2016-10-03 (×4): qty 1

## 2016-10-03 MED ORDER — SENNOSIDES-DOCUSATE SODIUM 8.6-50 MG PO TABS
1.0000 | ORAL_TABLET | Freq: Two times a day (BID) | ORAL | Status: DC
  Administered 2016-10-03 – 2016-10-05 (×5): 1 via ORAL
  Filled 2016-10-03 (×5): qty 1

## 2016-10-03 MED ORDER — SODIUM POLYSTYRENE SULFONATE 15 GM/60ML PO SUSP
30.0000 g | Freq: Once | ORAL | Status: AC
  Administered 2016-10-03: 30 g via ORAL
  Filled 2016-10-03: qty 120

## 2016-10-03 MED ORDER — ACETAMINOPHEN 325 MG PO TABS
650.0000 mg | ORAL_TABLET | Freq: Four times a day (QID) | ORAL | Status: DC | PRN
  Administered 2016-10-05 – 2016-10-06 (×3): 650 mg via ORAL
  Filled 2016-10-03 (×3): qty 2

## 2016-10-03 MED ORDER — POLYETHYLENE GLYCOL 3350 17 G PO PACK
17.0000 g | PACK | Freq: Every day | ORAL | Status: DC | PRN
  Administered 2016-10-03: 17 g via ORAL
  Filled 2016-10-03: qty 1

## 2016-10-03 MED ORDER — INSULIN ASPART 100 UNIT/ML ~~LOC~~ SOLN
0.0000 [IU] | Freq: Every day | SUBCUTANEOUS | Status: DC
  Administered 2016-10-03: 3 [IU] via SUBCUTANEOUS
  Administered 2016-10-04: 4 [IU] via SUBCUTANEOUS
  Administered 2016-10-05: 3 [IU] via SUBCUTANEOUS
  Administered 2016-10-06: 2 [IU] via SUBCUTANEOUS
  Filled 2016-10-03: qty 4
  Filled 2016-10-03: qty 3
  Filled 2016-10-03 (×2): qty 2
  Filled 2016-10-03: qty 3

## 2016-10-03 NOTE — Evaluation (Signed)
Physical Therapy Evaluation Patient Details Name: Jim [name] L Myrie MRN: [id number] DOB: [birth year] Today's Date: 10/03/2016   History of Present Illness  82 y.o. male with a known history of recent sepsis with pneumonia, hypertension and diabetes. The patient was recently admitted for pneumonia and was discharged, came back and d/c and returns with acute kidney injury. He has generally been getting weaker and weaker - w/c bound at baseline.  Clinical Impression  Pt is very weak in U&LEs and has apparently been having a hard time with most mobility.  He reports he wants to work hard at rehab and try to re-gain some strength but also realizes that he may need a higher level of care depending on how much function he is able to restore.  Pt reports he has no used his prosthetic in many months but that he has been able to self transfer to his electric chair on his own.  Follow Up Recommendations SNF    Equipment Recommendations  None recommended by PT    Recommendations for Other Services       Precautions / Restrictions Precautions Precautions: Fall Restrictions Weight Bearing Restrictions: No      Mobility  Bed Mobility Overal bed mobility: Needs Assistance Bed Mobility: Supine to Sit;Sit to Supine     Supine to sit: Mod assist Sit to supine: Max assist   General bed mobility comments: Pt very limited with what he is able to do, needs heavy assist from PT to get to sitting, adjust at EOB.  He has pain in abdomen during sitting and struggles to maintain balance secondary to severe weakness.   Transfers                    Ambulation/Gait                Stairs            Wheelchair Mobility    Modified Rankin (Stroke Patients Only)       Balance Overall balance assessment: Needs assistance Sitting-balance support: Bilateral upper extremity supported Sitting balance-Leahy Scale: Poor Sitting balance - Comments: Pt falling backward and c/o pain during  sitting, very weak                                     Pertinent Vitals/Pain Pain Assessment:  (not rated) Pain Location: bottom of R foot, discomfort with most movement    Home Living Family/patient expects to be discharged to:: Skilled nursing facility Living Arrangements: Children (step-son who requires dependent care, family helps a lot) Available Help at Discharge: Family Type of Home: House Home Access: Ramped entrance     Home Layout: One level Home Equipment: Shower seat;Grab bars - tub/shower;Grab bars - toilet;Walker - 2 wheels;Wheelchair - power      Prior Function Level of Independence: Needs assistance   Gait / Transfers Assistance Needed: slide transfer to electric chair.     Comments: Pt reports he has been weaker and weaker recently with minimal mobility     Hand Dominance        Extremity/Trunk Assessment   Upper Extremity Assessment: Generalized weakness (pt struggles to lift either arm against gravity, grossly 3/5)           Lower Extremity Assessment: Generalized weakness (R LE grossly 3/5, struggles with even minimal movement)   LLE Deficits / Details: L BKA, general weakness, baseline limitations  Communication   Communication: No difficulties  Cognition Arousal/Alertness: Awake/alert Behavior During Therapy: WFL for tasks assessed/performed Overall Cognitive Status: Within Functional Limits for tasks assessed                      General Comments      Exercises     Assessment/Plan    PT Assessment Patient needs continued PT services  PT Problem List Decreased strength;Decreased range of motion;Decreased balance;Decreased mobility;Decreased knowledge of use of DME;Decreased safety awareness;Pain          PT Treatment Interventions DME instruction;Functional mobility training;Therapeutic activities;Therapeutic exercise;Balance training;Patient/family education    PT Goals (Current goals can be found  in the Care Plan section)  Acute Rehab PT Goals Patient Stated Goal: build strength to go back home PT Goal Formulation: With patient Time For Goal Achievement: 10/17/16 Potential to Achieve Goals: Fair    Frequency Min 2X/week   Barriers to discharge        Co-evaluation               End of Session   Activity Tolerance: Patient limited by fatigue Patient left: with bed alarm set;with call bell/phone within reach;with family/visitor present Nurse Communication: Mobility status         Time: 2130-8657 PT Time Calculation (min) (ACUTE ONLY): 25 min   Charges:   PT Evaluation $PT Eval Low Complexity: 1 Procedure     PT G Codes:        Malachi Pro, DPT 10/03/2016, 3:21 PM

## 2016-10-03 NOTE — Consult Note (Signed)
[facility]  CONSULT NOTE    Date: 10/03/2016                  Patient Name:  Jim [name]  MRN: [id number]  DOB: Mar 29, 1930  Age / Sex: 82 y.o., male         PCP: Leanna Sato, MD                 Service Requesting Consult: Dr. Renae Gloss                 Reason for Consult: Acute Renal Failure            History of Present Illness: Jim [name] is a 82 y.o. white male with diabetes mellitus type II, hypertension, diabetic foot ucler, peripheral vascular disease, right BKA, who was just admitted from [facility] who was admitted from 9/27 to 9/29 for pneumonia. He was discharged to [facility]. He now presents to [facility] on 10/02/2016 for Obstructive uropathy [N13.9] AKI (acute kidney injury) (HCC) [N17.9]  Creatinine on admission of 4.98, foley placed and red colored urine released. Baseline creatinine of 1.   Son at bedside.    Medications: Outpatient medications: Prescriptions Prior to Admission  Medication Sig Dispense Refill Last Dose  . aspirin EC 81 MG tablet Take 81 mg by mouth daily.   10/02/2016 at Unknown time  . fluconazole (DIFLUCAN) 100 MG tablet Take 100 mg by mouth daily.   10/02/2016 at Unknown time  . LANTUS 100 UNIT/ML injection Inject 5 Units into the skin 2 (two) times daily.    10/02/2016 at Unknown time  . oxyCODONE (ROXICODONE) 15 MG immediate release tablet Take 0.5 tablets (7.5 mg total) by mouth every 4 (four) hours as needed for moderate pain. (Patient taking differently: Take 7.5 mg by mouth every 6 (six) hours as needed for pain. ) 30 tablet 0 10/02/2016 at Unknown time  . polyethylene glycol (MIRALAX / GLYCOLAX) packet Take 17 g by mouth daily. (Patient taking differently: Take 17 g by mouth 2 (two) times daily. ) 14 each 0 10/02/2016 at Unknown time  . sennosides-docusate sodium (SENOKOT-S) 8.6-50 MG tablet Take 1 tablet by mouth 2 (two) times daily.   10/02/2016 at Unknown time  . amoxicillin-clavulanate (AUGMENTIN) 875-125 MG tablet Take 1  tablet by mouth every 12 (twelve) hours. 12 tablet 0   . aspirin 325 MG tablet Take 1 tablet (325 mg total) by mouth daily. (Patient not taking: Reported on 10/02/2016) 30 tablet 1 Not Taking at Unknown time    Current medications: Current Facility-Administered Medications  Medication Dose Route Frequency Provider Last Rate Last Dose  . 0.9 %  sodium chloride infusion   Intravenous Continuous Arnaldo Natal, MD 100 mL/hr at 10/03/16 1131    . acetaminophen (TYLENOL) tablet 650 mg  650 mg Oral Q6H PRN Arnaldo Natal, MD       Or  . acetaminophen (TYLENOL) suppository 650 mg  650 mg Rectal Q6H PRN Arnaldo Natal, MD      . aspirin EC tablet 81 mg  81 mg Oral Daily Arnaldo Natal, MD   81 mg at 10/03/16 1014  . feeding supplement (ENSURE ENLIVE) (ENSURE ENLIVE) liquid 237 mL  237 mL Oral BID BM Alford Highland, MD      . finasteride (PROSCAR) tablet 5 mg  5 mg Oral Daily Alford Highland, MD      . fluconazole (DIFLUCAN) tablet 100 mg  100 mg Oral Daily  Arnaldo Natal, MD   100 mg at 10/03/16 1014  . heparin injection 5,000 Units  5,000 Units Subcutaneous Q8H Arnaldo Natal, MD   5,000 Units at 10/03/16 [phone]  . insulin aspart (novoLOG) injection 0-9 Units  0-9 Units Subcutaneous TID WC Arnaldo Natal, MD   3 Units at 10/03/16 1214  . ondansetron (ZOFRAN) tablet 4 mg  4 mg Oral Q6H PRN Arnaldo Natal, MD       Or  . ondansetron [facility]) injection 4 mg  4 mg Intravenous Q6H PRN Arnaldo Natal, MD   4 mg at 10/03/16 0330  . oxyCODONE (Oxy IR/ROXICODONE) immediate release tablet 7.5 mg  7.5 mg Oral Q4H PRN Arnaldo Natal, MD   7.5 mg at 10/03/16 1214  . polyethylene glycol (MIRALAX / GLYCOLAX) packet 17 g  17 g Oral Daily PRN Arnaldo Natal, MD      . senna-docusate (Senokot-S) tablet 1 tablet  1 tablet Oral BID Arnaldo Natal, MD   1 tablet at 10/03/16 1014  . sodium polystyrene (KAYEXALATE) 15 GM/60ML suspension 30 g  30 g Oral Once Alford Highland, MD      .  tamsulosin [facility]) capsule 0.4 mg  0.4 mg Oral QPC supper Alford Highland, MD          Allergies: Allergies  Allergen Reactions  . Penicillins Rash      Past Medical History: Past Medical History:  Diagnosis Date  . Diabetes mellitus without complication (HCC)   . Hypertension   . Right foot ulcer (HCC)      Past Surgical History: Past Surgical History:  Procedure Laterality Date  . LEG AMPUTATION BELOW KNEE  1967   RIGHT     Family History: No family history on file.   Social History: Social History   Social History  . Marital status: Widowed    Spouse name: N/A  . Number of children: N/A  . Years of education: N/A   Occupational History  . Not on file.   Social History Main Topics  . Smoking status: Former Smoker    Types: Cigarettes    Quit date: 12/30/1995  . Smokeless tobacco: Never Used  . Alcohol use No  . Drug use: No  . Sexual activity: Not on file   Other Topics Concern  . Not on file   Social History Narrative  . No narrative on file     Review of Systems: Review of Systems  Constitutional: Negative.   HENT: Negative.   Eyes: Negative.   Respiratory: Negative.   Cardiovascular: Negative.   Gastrointestinal: Negative.   Genitourinary: Positive for dysuria, flank pain, frequency, hematuria and urgency.  Musculoskeletal: Positive for back pain and falls. Negative for joint pain and neck pain.  Skin: Negative.   Neurological: Negative.   Endo/Heme/Allergies: Negative.   Psychiatric/Behavioral: Positive for depression. Negative for hallucinations, memory loss, substance abuse and suicidal ideas. The patient is not nervous/anxious and does not have insomnia.     Vital Signs: Blood pressure (!) 112/44, pulse 85, temperature 98.7 F (37.1 C), temperature source Oral, resp. rate 16, height 6' (1.829 m), weight 77.1 kg (170 lb), SpO2 93 %.  Weight trends: Filed Weights   10/03/16 0320 10/03/16 0330  Weight: 78 kg (172 lb) 77.1 kg  (170 lb)    Physical Exam: General: NAD, elderly, laying in bed  Head: Normocephalic, atraumatic. Moist oral mucosal membranes  Eyes: Anicteric, PERRL  Neck: Supple, trachea midline  Lungs:  Clear to auscultation  Heart: Regular rate and rhythm  Abdomen:  Soft, nontender,   Extremities:  trace peripheral edema.  Neurologic: Nonfocal, moving all four extremities  Skin: No lesions  GU: Foley with red urine     Lab results: Basic Metabolic Panel:  Recent Labs Lab 10/02/16 2124  NA 137  K 6.1*  CL 101  CO2 19*  GLUCOSE 346*  BUN 73*  CREATININE 4.98*  CALCIUM 8.0*    Liver Function Tests:  Recent Labs Lab 10/02/16 2124  AST 13*  ALT 7*  ALKPHOS 84  BILITOT 1.2  PROT 6.5  ALBUMIN 2.2*   No results for input(s): LIPASE, AMYLASE in the last 168 hours. No results for input(s): AMMONIA in the last 168 hours.  CBC:  Recent Labs Lab 10/02/16 2124  WBC 14.5*  NEUTROABS 13.1*  HGB 14.2  HCT 41.8  MCV 85.5  PLT 321    Cardiac Enzymes: No results for input(s): CKTOTAL, CKMB, CKMBINDEX, TROPONINI in the last 168 hours.  BNP: Invalid input(s): POCBNP  CBG:  Recent Labs Lab 09/26/16 1657 10/03/16 0245 10/03/16 0751 10/03/16 1158  GLUCAP 194* 175* 134* 203*    Microbiology: Results for orders placed or performed during the hospital encounter of 09/24/16  Blood culture (routine x 2)     Status: None   Collection Time: 09/24/16 11:32 AM  Result Value Ref Range Status   Specimen Description BLOOD RIGHT FOREARM  Final   Special Requests BOTTLES DRAWN AEROBIC AND ANAEROBIC  2CC  Final   Culture NO GROWTH 5 DAYS  Final   Report Status 09/29/2016 FINAL  Final  Blood culture (routine x 2)     Status: None   Collection Time: 09/24/16  1:15 PM  Result Value Ref Range Status   Specimen Description BLOOD RIGHT FOREARM  Final   Special Requests BOTTLES DRAWN AEROBIC AND ANAEROBIC  2CC  Final   Culture NO GROWTH 5 DAYS  Final   Report Status 09/29/2016  FINAL  Final  MRSA PCR Screening     Status: None   Collection Time: 09/24/16  5:25 PM  Result Value Ref Range Status   MRSA by PCR NEGATIVE NEGATIVE Final    Comment:        The GeneXpert MRSA Assay (FDA approved for NASAL specimens only), is one component of a comprehensive MRSA colonization surveillance program. It is not intended to diagnose MRSA infection nor to guide or monitor treatment for MRSA infections.     Coagulation Studies: No results for input(s): LABPROT, INR in the last 72 hours.  Urinalysis:  Recent Labs  10/02/16 2124  COLORURINE RED*  LABSPEC 1.014  PHURINE 5.0  GLUCOSEU 50*  HGBUR 3+*  BILIRUBINUR NEGATIVE  KETONESUR TRACE*  PROTEINUR 100*  NITRITE NEGATIVE  LEUKOCYTESUR NEGATIVE      Imaging: [place] Renal  Result Date: 10/03/2016 CLINICAL DATA:  Obstructive uropathy. EXAM: RENAL / URINARY TRACT ULTRASOUND COMPLETE COMPARISON:  None. FINDINGS: Right Kidney: Length: 10.9 cm. Minimal upper and lower pole caliectasis. No overt hydronephrosis. Left Kidney: Length: 12.0 cm. Echogenicity within normal limits. No hydronephrosis. Upper pole 3.0 cm cyst. Bladder: Collapsed around a Foley catheter. IMPRESSION: Minimal right-sided caliectasis.  No overt hydronephrosis. Electronically Signed   By: Jeronimo GreavesKyle  Talbot M.D.   On: 10/03/2016 09:38      Assessment & Plan: Jim [name] is a 82 y.o. white male with diabetes mellitus type II, hypertension, diabetic foot ucler, peripheral vascular disease, right BKA, who was  just admitted from [facility] who was admitted from 9/27 to 9/29 for pneumonia. He was discharged to [facility]. He now presents to [facility] on 10/02/2016 for Obstructive uropathy [N13.9] AKI (acute kidney injury) (HCC) [N17.9]  1. Acute Renal Failure with obstructive uropathy: with hyperkalemia and metabolic acidosis: kayexalate given on admission.  - foley catheter placed - Finesteride and tamsulosin - Continue IV fluids - Discussed case with Urology, will  need outpatient outpatient.  - not currently on and ACE-I/ARB  2. Diabetes Mellitus type II with renal manifestation: - Continue glucose control.   3. Hypertension: hypotensive on admission.  - holding blood pressure medications   LOS: 0 Jim [name] 10/6/20171:36 PM

## 2016-10-03 NOTE — NC FL2 (Signed)
[place] MEDICAID [id number] LEVEL OF CARE SCREENING TOOL     IDENTIFICATION  Patient Name: Jim [name] Birthdate: 09-10-1930 Sex: male Admission Date (Current Location): 10/02/2016  [place] and [place] Number:  Chiropodist and Address:  [facility], [address] [place], [place] [address]      Provider Number: [id number]  Attending Physician Name and Address:  Alford Highland, MD  Relative Name and Phone Number:       Current Level of Care: Hospital Recommended Level of Care: Skilled Nursing Facility Prior Approval Number:    Date Approved/Denied:   PASRR Number: [id number]  Discharge Plan: SNF    Current Diagnoses: Patient Active Problem List   Diagnosis Date Noted  . AKI (acute kidney injury) (HCC) 10/03/2016  . Pneumonia 09/24/2016  . Pressure injury of skin 09/17/2016  . Sepsis (HCC) 09/16/2016    Orientation RESPIRATION BLADDER Height & Weight     Self, Time, Situation, Place  Normal Incontinent Weight: 170 lb (77.1 kg) Height:  6' (182.9 cm)  BEHAVIORAL SYMPTOMS/MOOD NEUROLOGICAL BOWEL NUTRITION STATUS   (none)  (none) Continent Diet (carb modified)  AMBULATORY STATUS COMMUNICATION OF NEEDS Skin   Extensive Assist Verbally PU Stage and Appropriate Care (stage IV)                       Personal Care Assistance Level of Assistance  Bathing, Dressing Bathing Assistance: Limited assistance Feeding assistance: Limited assistance Dressing Assistance: Limited assistance     Functional Limitations Info  Hearing   Hearing Info: Impaired      SPECIAL CARE FACTORS FREQUENCY  PT (By licensed PT)                    Contractures Contractures Info: Not present    Additional Factors Info  Code Status Code Status Info: dnr Allergies Info: pcns           Current Medications (10/03/2016):  This is the current hospital active medication list Current Facility-Administered Medications  Medication Dose Route  Frequency Provider Last Rate Last Dose  . 0.9 %  sodium chloride infusion   Intravenous Continuous Arnaldo Natal, MD 100 mL/hr at 10/03/16 1131    . acetaminophen (TYLENOL) tablet 650 mg  650 mg Oral Q6H PRN Arnaldo Natal, MD       Or  . acetaminophen (TYLENOL) suppository 650 mg  650 mg Rectal Q6H PRN Arnaldo Natal, MD      . aspirin EC tablet 81 mg  81 mg Oral Daily Arnaldo Natal, MD   81 mg at 10/03/16 1014  . feeding supplement (ENSURE ENLIVE) (ENSURE ENLIVE) liquid 237 mL  237 mL Oral BID BM Alford Highland, MD      . finasteride (PROSCAR) tablet 5 mg  5 mg Oral Daily Alford Highland, MD      . fluconazole (DIFLUCAN) tablet 100 mg  100 mg Oral Daily Arnaldo Natal, MD   100 mg at 10/03/16 1014  . heparin injection 5,000 Units  5,000 Units Subcutaneous Q8H Arnaldo Natal, MD   5,000 Units at 10/03/16 [phone]  . insulin aspart (novoLOG) injection 0-9 Units  0-9 Units Subcutaneous TID WC Arnaldo Natal, MD   3 Units at 10/03/16 1214  . ondansetron (ZOFRAN) tablet 4 mg  4 mg Oral Q6H PRN Arnaldo Natal, MD       Or  . ondansetron [facility]) injection 4 mg  4 mg  Intravenous Q6H PRN Arnaldo NatalMichael S Diamond, MD   4 mg at 10/03/16 0330  . oxyCODONE (Oxy IR/ROXICODONE) immediate release tablet 7.5 mg  7.5 mg Oral Q4H PRN Arnaldo NatalMichael S Diamond, MD   7.5 mg at 10/03/16 1214  . polyethylene glycol (MIRALAX / GLYCOLAX) packet 17 g  17 g Oral Daily PRN Arnaldo NatalMichael S Diamond, MD      . senna-docusate (Senokot-S) tablet 1 tablet  1 tablet Oral BID Arnaldo NatalMichael S Diamond, MD   1 tablet at 10/03/16 1014  . sodium polystyrene (KAYEXALATE) 15 GM/60ML suspension 30 g  30 g Oral Once Alford Highlandichard Wieting, MD      . tamsulosin [employer](FLOMAX) capsule 0.4 mg  0.4 mg Oral QPC supper Alford Highlandichard Wieting, MD         Discharge Medications: Please see discharge summary for a list of discharge medications.  Relevant Imaging Results:  Relevant Lab Results:   Additional Information ss: [id number]  York SpanielMonica Princetta Uplinger,  LCSW

## 2016-10-03 NOTE — Progress Notes (Signed)
Inpatient Diabetes Program Recommendations  AACE/ADA: New Consensus Statement on Inpatient Glycemic Control (2015)  Target Ranges:  Prepandial:   less than 140 mg/dL      [facility] postprandial:   less than 180 mg/dL (1-2 hours)      Critically ill patients:  140 - 180 mg/dL   Lab Results  Component Value Date   GLUCAP 134 (H) 10/03/2016   HGBA1C 11.3 (H) 09/16/2016    Review of Glycemic Control  Results for [name], [name] (MRN [id number]) as of 10/03/2016 09:32  Ref. Range 10/03/2016 02:45 10/03/2016 07:51  Glucose-Capillary Latest Ref Range: 65 - 99 mg/dL 347175 (H) 425134 (H)    Diabetes history: Type 2 Outpatient Diabetes medications: Lantus 5 units bid Current orders for Inpatient glycemic control: Lantus 5 units bid, Novolog 0-9 units tid  Inpatient Diabetes Program Recommendations:  Because of patient's poor renal function, consider changing Novolog correction to q6h, starting at 2pm today .  Based on current will likely need a higher dose of Lantus and correction Novolog at discharge.  Susette RacerJulie Jakaylee Sasaki, RN, BA, MHA, CDE Diabetes Coordinator Inpatient Diabetes Program  [phone] (Team Pager) [phone] [facility] Office) 10/03/2016 9:37 AM

## 2016-10-03 NOTE — H&P (Addendum)
Jim [name] is an 82 y.o. male.   Chief Complaint: Abnormal labs HPI: The patient with past medical history of diabetes presents emergency department from his rehabilitation facility due to abnormal labs indicating acute renal failure. The patient recently been discharged from the hospital for pneumonia and had follow-up labs which revealed increased creatinine. Upon arrival patient reportedly appeared toxic and groaned about abdominal pain although he is a poor historian. Bladder scan revealed at least 900 mL of retained urine. Plan urinary catheter placed 1500 mL of urine as well as clotted blood drained and the patient appeared immediately more comfortable. Vital signs remained stable and the patient was more talkative and energetic. However, due to his acute kidney injury emergency department staff called for admission.  Past Medical History:  Diagnosis Date  . Diabetes mellitus without complication ([place])   . Hypertension   . Right foot ulcer ([place])     Past Surgical History:  Procedure Laterality Date  . LEG AMPUTATION BELOW KNEE  1967   RIGHT    No family history on file. Social History:  reports that he quit smoking about 20 years ago. His smoking use included Cigarettes. He has never used smokeless tobacco. He reports that he does not drink alcohol or use drugs.  Allergies:  Allergies  Allergen Reactions  . Penicillins Rash    Medications Prior to Admission  Medication Sig Dispense Refill  . aspirin EC 81 MG tablet Take 81 mg by mouth daily.    . fluconazole (DIFLUCAN) 100 MG tablet Take 100 mg by mouth daily.    Marland Kitchen LANTUS 100 UNIT/ML injection Inject 5 Units into the skin 2 (two) times daily.     Marland Kitchen oxyCODONE (ROXICODONE) 15 MG immediate release tablet Take 0.5 tablets (7.5 mg total) by mouth every 4 (four) hours as needed for moderate pain. (Patient taking differently: Take 7.5 mg by mouth every 6 (six) hours as needed for pain. ) 30 tablet 0  . polyethylene glycol (MIRALAX /  GLYCOLAX) packet Take 17 g by mouth daily. (Patient taking differently: Take 17 g by mouth 2 (two) times daily. ) 14 each 0  . sennosides-docusate sodium (SENOKOT-S) 8.6-50 MG tablet Take 1 tablet by mouth 2 (two) times daily.    Marland Kitchen amoxicillin-clavulanate (AUGMENTIN) 875-125 MG tablet Take 1 tablet by mouth every 12 (twelve) hours. 12 tablet 0  . aspirin 325 MG tablet Take 1 tablet (325 mg total) by mouth daily. (Patient not taking: Reported on 10/02/2016) 30 tablet 1    Results for orders placed or performed during the hospital encounter of 10/02/16 (from the past 48 hour(s))  Comprehensive metabolic panel     Status: Abnormal   Collection Time: 10/02/16  9:24 PM  Result Value Ref Range   Sodium 137 135 - 145 mmol/L   Potassium 6.1 (H) 3.5 - 5.1 mmol/L   Chloride 101 101 - 111 mmol/L   CO2 19 (L) 22 - 32 mmol/L   Glucose, Bld 346 (H) 65 - 99 mg/dL   BUN 73 (H) 6 - 20 mg/dL   Creatinine, Ser 4.98 (H) 0.61 - 1.24 mg/dL   Calcium 8.0 (L) 8.9 - 10.3 mg/dL   Total Protein 6.5 6.5 - 8.1 g/dL   Albumin 2.2 (L) 3.5 - 5.0 g/dL   AST 13 (L) 15 - 41 U/L   ALT 7 (L) 17 - 63 U/L   Alkaline Phosphatase 84 38 - 126 U/L   Total Bilirubin 1.2 0.3 - 1.2 mg/dL  GFR calc non Af Amer 10 (L) >60 mL/min   GFR calc Af Amer 11 (L) >60 mL/min    Comment: (NOTE) The eGFR has been calculated using the CKD EPI equation. This calculation has not been validated in all clinical situations. eGFR's persistently <60 mL/min signify possible Chronic Kidney Disease.    Anion gap 17 (H) 5 - 15  Urinalysis complete, with microscopic ([facility] only)     Status: Abnormal   Collection Time: 10/02/16  9:24 PM  Result Value Ref Range   Color, Urine RED (A) YELLOW   APPearance CLOUDY (A) CLEAR   Glucose, UA 50 (A) NEGATIVE mg/dL   Bilirubin Urine NEGATIVE NEGATIVE   Ketones, ur TRACE (A) NEGATIVE mg/dL   Specific Gravity, Urine 1.014 1.005 - 1.030   Hgb urine dipstick 3+ (A) NEGATIVE   pH 5.0 5.0 - 8.0   Protein, ur 100  (A) NEGATIVE mg/dL   Nitrite NEGATIVE NEGATIVE   Leukocytes, UA NEGATIVE NEGATIVE   RBC / HPF TOO NUMEROUS TO COUNT 0 - 5 RBC/hpf   WBC, UA TOO NUMEROUS TO COUNT 0 - 5 WBC/hpf   Bacteria, UA NONE SEEN NONE SEEN   Squamous Epithelial / LPF NONE SEEN NONE SEEN   WBC Clumps PRESENT    Budding Yeast PRESENT   CBC with Differential     Status: Abnormal   Collection Time: 10/02/16  9:24 PM  Result Value Ref Range   WBC 14.5 (H) 3.8 - 10.6 K/uL   RBC 4.89 4.40 - 5.90 MIL/uL   Hemoglobin 14.2 13.0 - 18.0 g/dL   HCT 41.8 40.0 - 52.0 %   MCV 85.5 80.0 - 100.0 fL   MCH 29.1 26.0 - 34.0 pg   MCHC 34.0 32.0 - 36.0 g/dL   RDW 17.1 (H) 11.5 - 14.5 %   Platelets 321 150 - 440 K/uL   Neutrophils Relative % 90 %   Neutro Abs 13.1 (H) 1.4 - 6.5 K/uL   Lymphocytes Relative 4 %   Lymphs Abs 0.6 (L) 1.0 - 3.6 K/uL   Monocytes Relative 5 %   Monocytes Absolute 0.8 0.2 - 1.0 K/uL   Eosinophils Relative 0 %   Eosinophils Absolute 0.0 0 - 0.7 K/uL   Basophils Relative 1 %   Basophils Absolute 0.1 0 - 0.1 K/uL  Lactic acid, plasma     Status: None   Collection Time: 10/02/16  9:24 PM  Result Value Ref Range   Lactic Acid, Venous 1.8 0.5 - 1.9 mmol/L  Sodium, urine, random     Status: None   Collection Time: 10/02/16  9:24 PM  Result Value Ref Range   Sodium, Ur 34 mmol/L  Creatinine, urine, random     Status: None   Collection Time: 10/02/16  9:24 PM  Result Value Ref Range   Creatinine, Urine 60 mg/dL  TSH     Status: None   Collection Time: 10/02/16  9:24 PM  Result Value Ref Range   TSH 3.359 0.350 - 4.500 uIU/mL    Comment: Performed by a 3rd Generation assay with a functional sensitivity of <=0.01 uIU/mL.  Glucose, capillary     Status: Abnormal   Collection Time: 10/03/16  2:45 AM  Result Value Ref Range   Glucose-Capillary 175 (H) 65 - 99 mg/dL   Comment 1 Notify RN    No results found.  Review of Systems  Constitutional: Negative for chills and fever.  HENT: Negative for sore  throat and tinnitus.  Eyes: Negative for blurred vision and redness.  Respiratory: Negative for cough and shortness of breath.   Cardiovascular: Negative for chest pain, palpitations, orthopnea and PND.  Gastrointestinal: Positive for abdominal pain (Improved). Negative for diarrhea, nausea and vomiting.  Genitourinary: Negative for dysuria, frequency and urgency.  Musculoskeletal: Negative for joint pain and myalgias.  Skin: Negative for rash.       No lesions  Neurological: Negative for speech change, focal weakness and weakness.  Endo/Heme/Allergies: Does not bruise/bleed easily.       No temperature intolerance  Psychiatric/Behavioral: Negative for depression and suicidal ideas.    Blood pressure 116/63, pulse 97, temperature 97.5 F (36.4 C), temperature source Rectal, resp. rate 17, height 6' (1.829 m), weight 77.1 kg (170 lb), SpO2 99 %. Physical Exam  Constitutional: He appears well-developed and well-nourished. No distress.  HENT:  Head: Normocephalic and atraumatic.  Mouth/Throat: Oropharynx is clear and moist.  Eyes: Conjunctivae and EOM are normal. Pupils are equal, round, and reactive to light. No scleral icterus.  Neck: Normal range of motion. Neck supple. No JVD present. No tracheal deviation present. No thyromegaly present.  Cardiovascular: Normal rate, regular rhythm and normal heart sounds.  Exam reveals no gallop and no friction rub.   No murmur heard. Respiratory: Effort normal and breath sounds normal. No respiratory distress.  GI: Soft. Bowel sounds are normal. He exhibits no distension. There is no tenderness.  Genitourinary:  Genitourinary Comments: Foley in place  Musculoskeletal: Normal range of motion. He exhibits no edema.  Left BKA  Lymphadenopathy:    He has no cervical adenopathy.  Neurological: He is alert. No cranial nerve deficit.  Oriented x1  Skin: Skin is warm and dry. No rash noted. No erythema.  Psychiatric: He has a normal mood and affect.  His behavior is normal. Thought content normal.  Poor memory indicates degree of dementia     Assessment/Plan This is an 82 year old male admitted for acute kidney injury secondary to obstruction. 1. Acute kidney injury: Baseline creatinine just above 1. Now creatinine is greater than 4. Renal ultrasound ordered to visualize renal parenchyma for infiltrative pathology. Hydrate with intravenous fluid. Avoid nephrotoxic agents. Urology consult versus nephrology consult pending upon results. 2. Diabetes mellitus type 2: Uncontrolled; recent A1c 11. Continue Lantus twice a day as well as sliding scale. 3. DVT prophylaxis: Heparin 4. GI prophylaxis: None The patient is a DO NOT RESUSCITATE. Time spent on admission orders and patient care approximately 45 minutes  Harrie Foreman, MD 10/03/2016, 5:57 AM

## 2016-10-03 NOTE — Progress Notes (Signed)
Initial Nutrition Assessment     INTERVENTION:  -Continue Ensure Enlive po BID, each supplement provides 350 kcal and 20 grams of protein added. Will monitor intake and renal labs with supplement     NUTRITION DIAGNOSIS:   Increased nutrient needs related to wound healing as evidenced by estimated needs.    GOAL:   Patient will meet greater than or equal to 90% of their needs    MONITOR:   PO intake, Supplement acceptance, Weight trends  REASON FOR ASSESSMENT:   Low Braden    ASSESSMENT:      82 y.o male admitted with AKI, obstructive uropathy, hyperkalemia. Pt with history of DM, diabetic foot ulcer, PVD, right BKA, recently admitted for pneumonia.   Past Medical History:  Diagnosis Date  . Diabetes mellitus without complication (HCC)   . Hypertension   . Right foot ulcer (HCC)    Pt eating lunch during visit and reports good appetite prior to admission  Medications reviewed: NS at 15200ml/hr, kayexalate Labs reviewed: K 6.1, BUN 73, creatinine 4.98, glucose 346   Unable to complete Nutrition-Focused physical exam at this time. Pt eating lunch at the time of visit.  Diet Order:  Diet Carb Modified Fluid consistency: Thin; Room service appropriate? Yes  Skin:   (stage IV pressure ulcer noted)  Last BM:  PTA  Height:   Ht Readings from Last 1 Encounters:  10/03/16 6' (1.829 m)    Weight: Noted wt gain per wt taken in September 2017.    Wt Readings from Last 1 Encounters:  10/03/16 170 lb (77.1 kg)    Ideal Body Weight:     BMI:  Body mass index is 23.06 kg/m.  Estimated Nutritional Needs:   Kcal:  1925-2300 kcals/d  Protein:  92-115 g/d  Fluid:  >/= 1.9 L/d  EDUCATION NEEDS:   No education needs identified at this time  Valda Christenson B. Freida BusmanAllen, RD, LDN [phone] (pager) Weekend/On-Call pager [phone])

## 2016-10-03 NOTE — Clinical Social Work Note (Signed)
Clinical Social Work Assessment  Patient Details  Name: Jim [name] MRN: [id number] Date of Birth: October 05, 1930  Date of referral:  10/03/16               Reason for consult:  Facility Placement                Permission sought to share information with:  Facility Medical sales representativeContact Representative, Family Supports Permission granted to share information::  Yes, Verbal Permission Granted  Name::        Agency::     Relationship::     Contact Information:     Housing/Transportation Living arrangements for the past 2 months:  Skilled Nursing Facility, Single Family Home Source of Information:  Patient, Adult Children Patient Interpreter Needed:  None Criminal Activity/Legal Involvement Pertinent to Current Situation/Hospitalization:  No - Comment as needed Significant Relationships:  Adult Children Lives with:  Facility Resident Do you feel safe going back to the place where you live?    Need for family participation in patient care:  Yes (Comment)  Care giving concerns:  Patient has been at [facility] for STR.   Social Worker assessment / plan:  CSW informed by PT that they are recommending STR with a transition to long term care. CSW attempted to meet with patient but he was not able to speak well and did not remember that he had come from a rehab facility into the hospital. CSW contacted patient's son: Jim [name]: [phone] and he stated that he does not wish for his dad to return to [facility] and would prefer [facility] if possible. CSW explained the bedsearch process and also explained that patient's insurance would have to approve it. CSW informed patient's son of PT recommendation and he is in agreement that his father cannot return home. He has applied for medicaid for his father and is waiting to hear the disposition. CSW has conducted a bedsearch. Information cannot be faxed in to insurance as of yet because PT note is not in the medical record as of yet.   Employment  status:  Retired Database administratornsurance information:  Managed Medicare PT Recommendations:  Skilled Nursing Facility Information / Referral to community resources:     Patient/Family's Response to care:  Patient's son expressed appreciation for CSW assistance.  Patient/Family's Understanding of and Emotional Response to Diagnosis, Current Treatment, and Prognosis:  Patient's son is aware of patient's limitations and being unable to care for himself at this time.  Emotional Assessment Appearance:  Appears stated age Attitude/Demeanor/Rapport:   (pleasant and cooperative but somewhat confused) Affect (typically observed):  Quiet, Calm Orientation:  Oriented to Self, Oriented to Place Alcohol / Substance use:  Not Applicable Psych involvement (Current and /or in the community):  No (Comment)  Discharge Needs  Concerns to be addressed:  Care Coordination Readmission within the last 30 days:  No Current discharge risk:  None Barriers to Discharge:  No Barriers Identified   York SpanielMonica Mussa Groesbeck, LCSW 10/03/2016, 3:13 PM

## 2016-10-03 NOTE — Progress Notes (Signed)
Patient ID: Jim [name], male   DOB: 07-Apr-1930, 82 y.o.   MRN: [id number]  [facility] PROGRESS NOTE  Jim [name] [id number] DOB: 07-Apr-1930 DOA: 10/02/2016 PCP: Leanna SatoMILES,LINDA M, MD  HPI/Subjective: Patient feeling okay. Foley catheter placed and a lot of urine came out. Patient not having any abdominal pain at this point. Appetite better today than it has been over the last few days.  Objective: Vitals:   10/03/16 0751 10/03/16 1314  BP: 118/76 (!) 112/44  Pulse: 84 85  Resp: 18 16  Temp: 98.1 F (36.7 C) 98.7 F (37.1 C)    Filed Weights   10/03/16 0320 10/03/16 0330  Weight: 78 kg (172 lb) 77.1 kg (170 lb)    ROS: Review of Systems  Constitutional: Negative for chills and fever.  Eyes: Negative for blurred vision.  Respiratory: Negative for cough and shortness of breath.   Cardiovascular: Negative for chest pain.  Gastrointestinal: Positive for abdominal pain. Negative for constipation, diarrhea, nausea and vomiting.  Genitourinary: Positive for dysuria and hematuria.  Musculoskeletal: Negative for joint pain.  Neurological: Negative for dizziness and headaches.   Exam: Physical Exam  Constitutional: He is oriented to person, place, and time.  HENT:  Nose: No mucosal edema.  Mouth/Throat: No oropharyngeal exudate or posterior oropharyngeal edema.  Eyes: Conjunctivae, EOM and lids are normal. Pupils are equal, round, and reactive to light.  Neck: No JVD present. Carotid bruit is not present. No edema present. No thyroid mass and no thyromegaly present.  Cardiovascular: S1 normal and S2 normal.  Exam reveals no gallop.   No murmur heard. Pulses:      Dorsalis pedis pulses are 2+ on the right side, and 2+ on the left side.  Respiratory: No respiratory distress. He has no wheezes. He has no rhonchi. He has no rales.  GI: Soft. Bowel sounds are normal. There is no tenderness.  Musculoskeletal:       Right ankle: He exhibits swelling.       Left ankle: He  exhibits swelling.  Lymphadenopathy:    He has no cervical adenopathy.  Neurological: He is alert and oriented to person, place, and time. No cranial nerve deficit.  Skin: Skin is warm. Nails show no clubbing.  Bruising at prior amputation site. Bilateral arms are red from the mid bicep down to the hand. Not really warm to the touch.  Psychiatric: He has a normal mood and affect.      Data Reviewed: Basic Metabolic Panel:  Recent Labs Lab 10/02/16 2124  NA 137  K 6.1*  CL 101  CO2 19*  GLUCOSE 346*  BUN 73*  CREATININE 4.98*  CALCIUM 8.0*   Liver Function Tests:  Recent Labs Lab 10/02/16 2124  AST 13*  ALT 7*  ALKPHOS 84  BILITOT 1.2  PROT 6.5  ALBUMIN 2.2*   CBC:  Recent Labs Lab 10/02/16 2124  WBC 14.5*  NEUTROABS 13.1*  HGB 14.2  HCT 41.8  MCV 85.5  PLT 321    CBG:  Recent Labs Lab 09/26/16 1657 10/03/16 0245 10/03/16 0751 10/03/16 1158  GLUCAP 194* 175* 134* 203*    Recent Results (from the past 240 hour(s))  Blood culture (routine x 2)     Status: None   Collection Time: 09/24/16 11:32 AM  Result Value Ref Range Status   Specimen Description BLOOD RIGHT FOREARM  Final   Special Requests BOTTLES DRAWN AEROBIC AND ANAEROBIC  2CC  Final   Culture NO GROWTH 5 DAYS  Final   Report Status 09/29/2016 FINAL  Final  Blood culture (routine x 2)     Status: None   Collection Time: 09/24/16  1:15 PM  Result Value Ref Range Status   Specimen Description BLOOD RIGHT FOREARM  Final   Special Requests BOTTLES DRAWN AEROBIC AND ANAEROBIC  2CC  Final   Culture NO GROWTH 5 DAYS  Final   Report Status 09/29/2016 FINAL  Final  MRSA PCR Screening     Status: None   Collection Time: 09/24/16  5:25 PM  Result Value Ref Range Status   MRSA by PCR NEGATIVE NEGATIVE Final    Comment:        The GeneXpert MRSA Assay (FDA approved for NASAL specimens only), is one component of a comprehensive MRSA colonization surveillance program. It is not intended to  diagnose MRSA infection nor to guide or monitor treatment for MRSA infections.      Studies: US Renal  Result Date: 10/03/2016 CLINICAL DATA:  Obstructive uropathy. EXAM: RENAL / URINARY TRACT ULTRASOUND COMPLETE COMPARISON:  None. FINDINGS: Right Kidney: Length: 10.9 cm. Minimal upper and lower pole caliectasis. No overt hydronephrosis. Left Kidney: Length: 12.0 cm. Echogenicity within normal limits. No hydronephrosis. Upper pole 3.0 cm cyst. Bladder: Collapsed around a Foley catheter. IMPRESSION: Minimal right-sided caliectasis.  No overt hydronephrosis. Electronically Signed   By: Jeronimo Greaves M.D.   On: 10/03/2016 09:38    Scheduled Meds: . aspirin EC  81 mg Oral Daily  . feeding supplement (ENSURE ENLIVE)  237 mL Oral BID BM  . finasteride  5 mg Oral Daily  . fluconazole  100 mg Oral Daily  . heparin  5,000 Units Subcutaneous Q8H  . insulin aspart  0-5 Units Subcutaneous QHS  . insulin aspart  0-9 Units Subcutaneous TID WC  . senna-docusate  1 tablet Oral BID  . tamsulosin  0.4 mg Oral QPC supper   Continuous Infusions: . sodium chloride 100 mL/hr at 10/03/16 1131    Assessment/Plan:  1. Acute kidney injury. Likely due to obstruction due to prostate. Continue IV fluid hydration. Recheck creatinine today and again tomorrow. 2. Hyperkalemia. 1 dose of Kayexalate ordered. Patient also having constipation. This should also help. 3. BPH with urinary shock shin. Start Flomax and finasteride 4. Acute cystitis with hematuria. Rocephin. I added on a urine culture. 5. Erythema on the arms. Not quite sure if this infection or not. 6. Type 2 diabetes mellitus. Sliding scale for right now. Once we make sure he is eating better can start Lantus.   Code Status:     Code Status Orders        Start     Ordered   10/03/16 0234  Do not attempt resuscitation (DNR)  Continuous    Question Answer Comment  In the event of cardiac or respiratory ARREST Do not call a "code blue"   In the  event of cardiac or respiratory ARREST Do not perform Intubation, CPR, defibrillation or ACLS   In the event of cardiac or respiratory ARREST Use medication by any route, position, wound care, and other measures to relive pain and suffering. May use oxygen, suction and manual treatment of airway obstruction as needed for comfort.      10/03/16 0233    Code Status History    Date Active Date Inactive Code Status Order ID Comments User Context   09/24/2016  4:10 PM 09/26/2016  9:53 PM Full Code [id number]  Shaune Pollack, MD Inpatient   09/16/2016  9:13 PM 09/23/2016  5:20 PM Full Code [id number]  Enid Baas, MD ED    Advance Directive Documentation   Flowsheet Row Most Recent Value  Type of Advance Directive  Out of facility DNR (pink MOST or yellow form), Healthcare Power of Attorney  Pre-existing out of facility DNR order (yellow form or pink MOST form)  Physician notified to receive inpatient order  "MOST" Form in Place?  No data     Family Communication: Son at the bedside Disposition Plan: Back to rehabilitation once kidney function improves  Consultants: - Nephrology  Antibiotics:  Rocephin  Time spent:  Alford Highland  [employer]

## 2016-10-04 LAB — GLUCOSE, CAPILLARY
Glucose-Capillary: 196 mg/dL — ABNORMAL HIGH (ref 65–99)
Glucose-Capillary: 230 mg/dL — ABNORMAL HIGH (ref 65–99)
Glucose-Capillary: 300 mg/dL — ABNORMAL HIGH (ref 65–99)
Glucose-Capillary: 309 mg/dL — ABNORMAL HIGH (ref 65–99)

## 2016-10-04 LAB — BASIC METABOLIC PANEL
ANION GAP: 3 — AB (ref 5–15)
BUN: 21 mg/dL — ABNORMAL HIGH (ref 6–20)
CALCIUM: 7.3 mg/dL — AB (ref 8.9–10.3)
CO2: 30 mmol/L (ref 22–32)
CREATININE: 0.99 mg/dL (ref 0.61–1.24)
Chloride: 110 mmol/L (ref 101–111)
GFR calc non Af Amer: >60 mL/min (ref >60)
Glucose, Bld: 232 mg/dL — ABNORMAL HIGH (ref 65–99)
Potassium: 3.8 mmol/L (ref 3.5–5.1)
SODIUM: 143 mmol/L (ref 135–145)

## 2016-10-04 LAB — URINE CULTURE

## 2016-10-04 LAB — PSA, TOTAL AND FREE
PSA, Free Pct: 14.5 %
PSA, Free: 0.45 ng/mL
Prostate Specific Ag, Serum: 3.1 ng/mL (ref 0.0–4.0)

## 2016-10-04 MED ORDER — LORAZEPAM 1 MG PO TABS
1.0000 mg | ORAL_TABLET | ORAL | Status: AC
  Administered 2016-10-04: 1 mg via ORAL
  Filled 2016-10-04: qty 1

## 2016-10-04 MED ORDER — INSULIN GLARGINE 100 UNIT/ML ~~LOC~~ SOLN
5.0000 [IU] | Freq: Every day | SUBCUTANEOUS | Status: DC
  Administered 2016-10-04: 5 [IU] via SUBCUTANEOUS
  Filled 2016-10-04 (×2): qty 0.05

## 2016-10-04 NOTE — Progress Notes (Signed)
Patient sounded depressed, "want's to die",  " very weak" and being anxious. VS stable.  No signs of acute distress. MD notified and new order received.

## 2016-10-04 NOTE — Progress Notes (Addendum)
[facility] - [place] at [facility]   PATIENT NAME: Jim [name]    MR#:  [id number]  DATE OF BIRTH:  [birth year]  SUBJECTIVE:  CHIEF COMPLAINT:   Chief Complaint  Patient presents with  . Acute Renal Failure   - feels better, hematuria noted- but clearing - had BM, no complaints  REVIEW OF SYSTEMS:  Review of Systems  Constitutional: Positive for malaise/fatigue. Negative for chills and fever.  HENT: Negative for ear discharge, ear pain and nosebleeds.   Eyes: Negative for blurred vision and double vision.  Respiratory: Negative for cough, shortness of breath and wheezing.   Cardiovascular: Negative for chest pain, palpitations and leg swelling.  Gastrointestinal: Negative for abdominal pain, constipation, diarrhea, nausea and vomiting.  Genitourinary: Negative for dysuria.  Musculoskeletal: Negative for myalgias.  Neurological: Negative for dizziness, sensory change, speech change, focal weakness, seizures and headaches.  Psychiatric/Behavioral: Negative for depression.    DRUG ALLERGIES:   Allergies  Allergen Reactions  . Penicillins Rash    VITALS:  Blood pressure (!) 100/49, pulse 82, temperature 98.1 F (36.7 C), temperature source Oral, resp. rate 18, height 6' (1.829 m), weight 78.3 kg (172 lb 9.6 oz), SpO2 92 %.  PHYSICAL EXAMINATION:  Physical Exam  GENERAL:  82 y.o.-year-old patient lying in the bed with no acute distress.  EYES: Pupils equal, round, reactive to light and accommodation. No scleral icterus. Extraocular muscles intact.  HEENT: Head atraumatic, normocephalic. Oropharynx and nasopharynx clear.  NECK:  Supple, no jugular venous distention. No thyroid enlargement, no tenderness.  LUNGS: Normal breath sounds bilaterally, no wheezing, rales,rhonchi or crepitation. No use of accessory muscles of respiration.  Decreased bibasilar breath sounds CARDIOVASCULAR: S1, S2 normal. No murmurs, rubs, or gallops.  ABDOMEN: Soft, nontender,  nondistended. Bowel sounds present. No organomegaly or mass.  EXTREMITIES: No pedal edema, cyanosis, or clubbing. Left leg BKA NEUROLOGIC: Cranial nerves II through XII are intact. Muscle strength 5/5 in all extremities. Sensation intact. Gait not checked. Global weakness. PSYCHIATRIC: The patient is alert and oriented x 3.  SKIN: No obvious rash, lesion, or ulcer.    LABORATORY PANEL:   CBC  Recent Labs Lab 10/02/16 2124  WBC 14.5*  HGB 14.2  HCT 41.8  PLT 321   ------------------------------------------------------------------------------------------------------------------  Chemistries   Recent Labs Lab 10/02/16 2124  10/04/16 0532  NA 137  < > 143  K 6.1*  < > 3.8  CL 101  < > 110  CO2 19*  < > 30  GLUCOSE 346*  < > 232*  BUN 73*  < > 21*  CREATININE 4.98*  < > 0.99  CALCIUM 8.0*  < > 7.3*  AST 13*  --   --   ALT 7*  --   --   ALKPHOS 84  --   --   BILITOT 1.2  --   --   < > = values in this interval not displayed. ------------------------------------------------------------------------------------------------------------------  Cardiac Enzymes No results for input(s): TROPONINI in the last 168 hours. ------------------------------------------------------------------------------------------------------------------  RADIOLOGY:  US Renal  Result Date: 10/03/2016 CLINICAL DATA:  Obstructive uropathy. EXAM: RENAL / URINARY TRACT ULTRASOUND COMPLETE COMPARISON:  None. FINDINGS: Right Kidney: Length: 10.9 cm. Minimal upper and lower pole caliectasis. No overt hydronephrosis. Left Kidney: Length: 12.0 cm. Echogenicity within normal limits. No hydronephrosis. Upper pole 3.0 cm cyst. Bladder: Collapsed around a Foley catheter. IMPRESSION: Minimal right-sided caliectasis.  No overt hydronephrosis. Electronically Signed   By: Jeronimo Greaves M.D.   On:  10/03/2016 09:38    EKG:   Orders placed or performed during the hospital encounter of 10/02/16  . EKG 12-Lead  . EKG  12-Lead    ASSESSMENT AND PLAN:   82 year old male with past medical history significant for diabetes, left foot ulcer status post BKA, hypertension presented to hospital from rehabilitation facility secondary to urinary retention and acute renal failure.  #1 acute renal failure-likely secondary to urinary retention. Creatinine was greater than 4 on admission. -Improved after Foley catheter placed. -Renal ultrasound with normal renal parenchyma. -Continue Foley catheter at discharge until seen by urology in the office next week. -Continue Proscar and Flomax. -Discontinue IV fluids for now.  #2 hyperkalemia and metabolic acidosis on admission-secondary to renal failure. -Do not restart ACE inhibitor or ARB -Improved at this time  #3 hematuria-secondary to traumatic Foley insertion. Flush the catheter and monitor. -Hold heparin and aspirin  #4 diabetes mellitus-restart Lantus. Also on sliding scale insulin.  #5 DVT prophylaxis-hold subcutaneous heparin due to hematuria  Possible discharge to [facility] in 1-2 days    All the records are reviewed and case discussed with Care Management/Social Workerr. Management plans discussed with the patient, family and they are in agreement.  CODE STATUS: Full Code  TOTAL TIME TAKING CARE OF THIS PATIENT: 37 minutes.   POSSIBLE D/C in 1 day , DEPENDING ON CLINICAL CONDITION.   Damary Doland M.D on 10/04/2016 at 12:54 PM  Between 7am to 6pm - Pager - [phone]  After 6pm go to [email or web address] - password [email or web address]  [facility]  Office  [phone]  CC: Primary care physician; Leanna SatoMILES,LINDA M, MD

## 2016-10-04 NOTE — Progress Notes (Signed)
[facility]  ROUNDING NOTE   Subjective:   Creatinine 0.99 (1.52) (4.98)  UOP 2875  Objective:  Vital signs in last 24 hours:  Temp:  [97.7 F (36.5 C)-98.7 F (37.1 C)] 98.1 F (36.7 C) (10/07 0558) Pulse Rate:  [69-91] 82 (10/07 0558) Resp:  [16-18] 18 (10/07 0558) BP: (100-127)/(44-55) 100/49 (10/07 0558) SpO2:  [90 %-93 %] 92 % (10/07 0558) Weight:  [78.3 kg (172 lb 9.6 oz)] 78.3 kg (172 lb 9.6 oz) (10/07 0500)  Weight change: 0.272 kg (9.6 oz) Filed Weights   10/03/16 0320 10/03/16 0330 10/04/16 0500  Weight: 78 kg (172 lb) 77.1 kg (170 lb) 78.3 kg (172 lb 9.6 oz)    Intake/Output: I/O last 3 completed shifts: In: 3613 [P.O.:596; I.V.:2767; Other:100; IV Piggyback:150] Out: 6215 [Urine:6215]   Intake/Output this shift:  No intake/output data recorded.  Physical Exam: General: NAD, laying in bed  Head: Normocephalic, atraumatic. Moist oral mucosal membranes  Eyes: Anicteric, PERRL  Neck: Supple, trachea midline  Lungs:  Clear to auscultation  Heart: Regular rate and rhythm  Abdomen:  Soft, nontender,   Extremities: no peripheral edema.  Neurologic: Nonfocal, moving all four extremities  Skin: No lesions  GU: Foley with red urine    Basic Metabolic Panel:  Recent Labs Lab 10/02/16 2124 10/03/16 1618 10/04/16 0532  NA 137 141 143  K 6.1* 4.3 3.8  CL 101 108 110  CO2 19* 29 30  GLUCOSE 346* 181* 232*  BUN 73* 34* 21*  CREATININE 4.98* 1.52* 0.99  CALCIUM 8.0* 7.7* 7.3*    Liver Function Tests:  Recent Labs Lab 10/02/16 2124  AST 13*  ALT 7*  ALKPHOS 84  BILITOT 1.2  PROT 6.5  ALBUMIN 2.2*   No results for input(s): LIPASE, AMYLASE in the last 168 hours. No results for input(s): AMMONIA in the last 168 hours.  CBC:  Recent Labs Lab 10/02/16 2124  WBC 14.5*  NEUTROABS 13.1*  HGB 14.2  HCT 41.8  MCV 85.5  PLT 321    Cardiac Enzymes: No results for input(s): CKTOTAL, CKMB, CKMBINDEX, TROPONINI in the last 168  hours.  BNP: Invalid input(s): POCBNP  CBG:  Recent Labs Lab 10/03/16 0751 10/03/16 1158 10/03/16 1645 10/03/16 2138 10/04/16 0730  GLUCAP 134* 203* 184* 271* 196*    Microbiology: Results for orders placed or performed during the hospital encounter of 09/24/16  Blood culture (routine x 2)     Status: None   Collection Time: 09/24/16 11:32 AM  Result Value Ref Range Status   Specimen Description BLOOD RIGHT FOREARM  Final   Special Requests BOTTLES DRAWN AEROBIC AND ANAEROBIC  2CC  Final   Culture NO GROWTH 5 DAYS  Final   Report Status 09/29/2016 FINAL  Final  Blood culture (routine x 2)     Status: None   Collection Time: 09/24/16  1:15 PM  Result Value Ref Range Status   Specimen Description BLOOD RIGHT FOREARM  Final   Special Requests BOTTLES DRAWN AEROBIC AND ANAEROBIC  2CC  Final   Culture NO GROWTH 5 DAYS  Final   Report Status 09/29/2016 FINAL  Final  MRSA PCR Screening     Status: None   Collection Time: 09/24/16  5:25 PM  Result Value Ref Range Status   MRSA by PCR NEGATIVE NEGATIVE Final    Comment:        The GeneXpert MRSA Assay (FDA approved for NASAL specimens only), is one component of a comprehensive MRSA  colonization surveillance program. It is not intended to diagnose MRSA infection nor to guide or monitor treatment for MRSA infections.     Coagulation Studies: No results for input(s): LABPROT, INR in the last 72 hours.  Urinalysis:  Recent Labs  10/02/16 2124  COLORURINE RED*  LABSPEC 1.014  PHURINE 5.0  GLUCOSEU 50*  HGBUR 3+*  BILIRUBINUR NEGATIVE  KETONESUR TRACE*  PROTEINUR 100*  NITRITE NEGATIVE  LEUKOCYTESUR NEGATIVE      Imaging: US Renal  Result Date: 10/03/2016 CLINICAL DATA:  Obstructive uropathy. EXAM: RENAL / URINARY TRACT ULTRASOUND COMPLETE COMPARISON:  None. FINDINGS: Right Kidney: Length: 10.9 cm. Minimal upper and lower pole caliectasis. No overt hydronephrosis. Left Kidney: Length: 12.0 cm. Echogenicity  within normal limits. No hydronephrosis. Upper pole 3.0 cm cyst. Bladder: Collapsed around a Foley catheter. IMPRESSION: Minimal right-sided caliectasis.  No overt hydronephrosis. Electronically Signed   By: Jeronimo Greaves M.D.   On: 10/03/2016 09:38     Medications:   . sodium chloride 100 mL/hr at 10/04/16 0704   . aspirin EC  81 mg Oral Daily  . feeding supplement (ENSURE ENLIVE)  237 mL Oral BID BM  . finasteride  5 mg Oral Daily  . fluconazole  100 mg Oral Daily  . heparin  5,000 Units Subcutaneous Q8H  . insulin aspart  0-5 Units Subcutaneous QHS  . insulin aspart  0-9 Units Subcutaneous TID WC  . senna-docusate  1 tablet Oral BID  . tamsulosin  0.4 mg Oral QPC supper   acetaminophen **OR** acetaminophen, ondansetron **OR** ondansetron (ZOFRAN) IV, oxyCODONE, polyethylene glycol  Assessment/ Plan:  [name] is a 82 y.o.  male [name] is a 82 y.o. white male with diabetes mellitus type II, hypertension, diabetic foot ucler, peripheral vascular disease, right BKA, who was just admitted from [facility] who was admitted from 9/27 to 9/29 for pneumonia. [facility] was [facility] to [facility] He now presents to [facility] on 10/02/2016 for Obstructive uropathy [N13.9] and AKI (acute kidney injury) (HCC) [N17.9]   1. Acute Renal Failure with obstructive uropathy: with hyperkalemia and metabolic acidosis: kayexalate given on admission.  - foley catheter placed - Finesteride and tamsulosin started - Continue IV fluids: NS at 140mL/hr - Discussed case with Urology, will need outpatient outpatient.  - not currently on and ACE-I/ARB  2. Diabetes Mellitus type II with renal manifestations: - Continue glucose control.   3. Hypertension: hypotensive on admission.  - holding blood pressure medications   LOS: 1 Jim [name] 10/7/201710:34 AM

## 2016-10-05 LAB — GLUCOSE, CAPILLARY
Glucose-Capillary: 194 mg/dL — ABNORMAL HIGH (ref 65–99)
Glucose-Capillary: 300 mg/dL — ABNORMAL HIGH (ref 65–99)
Glucose-Capillary: 317 mg/dL — ABNORMAL HIGH (ref 65–99)
Glucose-Capillary: 250 mg/dL — ABNORMAL HIGH (ref 65–99)
Glucose-Capillary: 262 mg/dL — ABNORMAL HIGH (ref 65–99)

## 2016-10-05 LAB — BASIC METABOLIC PANEL
Anion gap: 5 (ref 5–15)
BUN: 13 mg/dL (ref 6–20)
CHLORIDE: 106 mmol/L (ref 101–111)
CO2: 28 mmol/L (ref 22–32)
CREATININE: 0.55 mg/dL — AB (ref 0.61–1.24)
Calcium: 7.2 mg/dL — ABNORMAL LOW (ref 8.9–10.3)
GFR calc non Af Amer: >60 mL/min (ref >60)
GLUCOSE: 234 mg/dL — AB (ref 65–99)
Potassium: 3.9 mmol/L (ref 3.5–5.1)
Sodium: 139 mmol/L (ref 135–145)

## 2016-10-05 MED ORDER — SENNOSIDES-DOCUSATE SODIUM 8.6-50 MG PO TABS
2.0000 | ORAL_TABLET | Freq: Two times a day (BID) | ORAL | Status: DC
  Administered 2016-10-05 – 2016-10-06 (×4): 2 via ORAL
  Filled 2016-10-05 (×4): qty 2

## 2016-10-05 MED ORDER — LORAZEPAM 1 MG PO TABS
1.0000 mg | ORAL_TABLET | Freq: Every evening | ORAL | Status: DC | PRN
  Administered 2016-10-05 – 2016-10-06 (×2): 1 mg via ORAL
  Filled 2016-10-05 (×2): qty 1

## 2016-10-05 MED ORDER — INSULIN GLARGINE 100 UNIT/ML ~~LOC~~ SOLN
8.0000 [IU] | Freq: Every day | SUBCUTANEOUS | Status: DC
  Administered 2016-10-05: 8 [IU] via SUBCUTANEOUS
  Filled 2016-10-05 (×2): qty 0.08

## 2016-10-05 MED ORDER — CITALOPRAM HYDROBROMIDE 20 MG PO TABS
10.0000 mg | ORAL_TABLET | Freq: Every day | ORAL | Status: DC
  Administered 2016-10-05 – 2016-10-06 (×2): 10 mg via ORAL
  Filled 2016-10-05: qty 2
  Filled 2016-10-05: qty 1

## 2016-10-05 NOTE — Clinical Social Work Note (Signed)
CSW visited patient and his son [name] at bedside to make bed offers. Patient was sleeping soundly. [name]. CSW updated in system and will con't to follow.  Argentina PonderKaren Martha Mat Stuard, MSW, LCSW-A [phone]

## 2016-10-05 NOTE — Care Management Important Message (Signed)
Important Message  Patient Details  Name: Jim [name] MRN: [id number] Date of Birth: [birth year]   Medicare Important Message Given:  Yes    [name] A, RN 10/05/2016, 2:45 PM

## 2016-10-05 NOTE — Progress Notes (Signed)
Patient continues to say that he feels weak.  He says he feels short of breath.  Lungs clear.  sats 96%.  Requesting to have something to help him sleep. Dr Nemiah CommanderKalisetti notified.  She will be ordering palliative to consult at rehab facility.  Ativan ordered

## 2016-10-05 NOTE — Progress Notes (Addendum)
[facility] [place] at [facility]   PATIENT NAME: Jim [name]    MR#:  [id number]  DATE OF BIRTH:  [birth year]  SUBJECTIVE:  CHIEF COMPLAINT:   Chief Complaint  Patient presents with  . Acute Renal Failure   - feels better, hematuria noted- but clearing - had BM, no complaints  REVIEW OF SYSTEMS:  Review of Systems  Constitutional: Positive for malaise/fatigue. Negative for chills and fever.  HENT: Negative for ear discharge, ear pain and nosebleeds.   Eyes: Negative for blurred vision and double vision.  Respiratory: Negative for cough, shortness of breath and wheezing.   Cardiovascular: Negative for chest pain, palpitations and leg swelling.  Gastrointestinal: Negative for abdominal pain, constipation, diarrhea, nausea and vomiting.  Genitourinary: Negative for dysuria.  Musculoskeletal: Negative for myalgias.  Neurological: Negative for dizziness, sensory change, speech change, focal weakness, seizures and headaches.  Psychiatric/Behavioral: Negative for depression.    DRUG ALLERGIES:   Allergies  Allergen Reactions  . Penicillins Rash    VITALS:  Blood pressure (!) 125/53, pulse 82, temperature 98.5 F (36.9 C), temperature source Oral, resp. rate 18, height 6' (1.829 m), weight 78.3 kg (172 lb 9.6 oz), SpO2 98 %.  PHYSICAL EXAMINATION:  Physical Exam  GENERAL:  82 y.o.-year-old patient lying in the bed with no acute distress.  EYES: Pupils equal, round, reactive to light and accommodation. No scleral icterus. Extraocular muscles intact.  HEENT: Head atraumatic, normocephalic. Oropharynx and nasopharynx clear.  NECK:  Supple, no jugular venous distention. No thyroid enlargement, no tenderness.  LUNGS: Normal breath sounds bilaterally, no wheezing, rales,rhonchi or crepitation. No use of accessory muscles of respiration.  Decreased bibasilar breath sounds CARDIOVASCULAR: S1, S2 normal. No murmurs, rubs, or gallops.  ABDOMEN: Soft, nontender,  nondistended. Bowel sounds present. No organomegaly or mass.  EXTREMITIES: No pedal edema, cyanosis, or clubbing. Left leg BKA NEUROLOGIC: Cranial nerves II through XII are intact. Muscle strength 5/5 in all extremities. Sensation intact. Gait not checked. Global weakness. PSYCHIATRIC: The patient is alert and oriented x 3.  SKIN: No obvious rash, lesion, or ulcer.    LABORATORY PANEL:   CBC  Recent Labs Lab 10/02/16 2124  WBC 14.5*  HGB 14.2  HCT 41.8  PLT 321   ------------------------------------------------------------------------------------------------------------------  Chemistries   Recent Labs Lab 10/02/16 2124  10/05/16 0552  NA 137  < > 139  K 6.1*  < > 3.9  CL 101  < > 106  CO2 19*  < > 28  GLUCOSE 346*  < > 234*  BUN 73*  < > 13  CREATININE 4.98*  < > 0.55*  CALCIUM 8.0*  < > 7.2*  AST 13*  --   --   ALT 7*  --   --   ALKPHOS 84  --   --   BILITOT 1.2  --   --   < > = values in this interval not displayed. ------------------------------------------------------------------------------------------------------------------  Cardiac Enzymes No results for input(s): TROPONINI in the last 168 hours. ------------------------------------------------------------------------------------------------------------------  RADIOLOGY:  No results found.  EKG:   Orders placed or performed during the hospital encounter of 10/02/16  . EKG 12-Lead  . EKG 12-Lead    ASSESSMENT AND PLAN:   82 year old male with past medical history significant for diabetes, left foot ulcer status post BKA, hypertension presented to hospital from rehabilitation facility secondary to urinary retention and acute renal failure.  #1 acute renal failure-likely secondary to urinary retention. Creatinine was greater than 4 on  admission. -Improved after Foley catheter placed. -Renal ultrasound with normal renal parenchyma. -Continue Foley catheter at discharge until seen by urology in the  office next week. -Continue Proscar and Flomax. -Discontinued IV fluids for now.  #2 hyperkalemia and metabolic acidosis on admission-secondary to renal failure. -Do not restart ACE inhibitor or ARB -Improved at this time  #3 hematuria-secondary to traumatic Foley insertion. Flush the catheter and improving. -Hold heparin and aspirin  #4 diabetes mellitus-restarted Lantus. Also on sliding scale insulin.  #5 Depression- appears depressed, discussed with son, feels lonely. Started celexa Palliative care f/u at discharge Check TSH  #6 DVT prophylaxis-hold subcutaneous heparin due to hematuria  Possible discharge to SNF tomorrow Palliative care f/u at the SNF Discussed with son Marcial Pacas today and patient as well    All the records are reviewed and case discussed with Care Management/Social Workerr. Management plans discussed with the patient, family and they are in agreement.  CODE STATUS: DNR  TOTAL TIME TAKING CARE OF THIS PATIENT: 37 minutes.   POSSIBLE D/C TOMORROW , DEPENDING ON CLINICAL CONDITION.   Tawania Daponte M.D on 10/05/2016 at 9:43 AM  Between 7am to 6pm - Pager - [phone]  After 6pm go to [email or web address] - password [email or web address]  [facility] [place] [facility]  Office  [phone]  CC: Primary care physician; Leanna Sato, MD

## 2016-10-06 ENCOUNTER — Telehealth: Payer: Self-pay | Admitting: Urology

## 2016-10-06 ENCOUNTER — Inpatient Hospital Stay: Payer: Medicare HMO

## 2016-10-06 DIAGNOSIS — Z515 Encounter for palliative care: Secondary | ICD-10-CM

## 2016-10-06 DIAGNOSIS — Z66 Do not resuscitate: Secondary | ICD-10-CM

## 2016-10-06 DIAGNOSIS — M6281 Muscle weakness (generalized): Secondary | ICD-10-CM

## 2016-10-06 DIAGNOSIS — N139 Obstructive and reflux uropathy, unspecified: Secondary | ICD-10-CM

## 2016-10-06 LAB — GLUCOSE, CAPILLARY
Glucose-Capillary: 203 mg/dL — ABNORMAL HIGH (ref 65–99)
Glucose-Capillary: 174 mg/dL — ABNORMAL HIGH (ref 65–99)
Glucose-Capillary: 222 mg/dL — ABNORMAL HIGH (ref 65–99)
Glucose-Capillary: 213 mg/dL — ABNORMAL HIGH (ref 65–99)

## 2016-10-06 MED ORDER — ENOXAPARIN SODIUM 40 MG/0.4ML ~~LOC~~ SOLN
40.0000 mg | SUBCUTANEOUS | Status: DC
  Administered 2016-10-06: 40 mg via SUBCUTANEOUS
  Filled 2016-10-06: qty 0.4

## 2016-10-06 MED ORDER — INSULIN GLARGINE 100 UNIT/ML ~~LOC~~ SOLN
12.0000 [IU] | Freq: Every day | SUBCUTANEOUS | Status: DC
  Administered 2016-10-06: 12 [IU] via SUBCUTANEOUS
  Filled 2016-10-06 (×2): qty 0.12

## 2016-10-06 MED ORDER — OXYCODONE HCL 5 MG PO TABS
10.0000 mg | ORAL_TABLET | ORAL | Status: DC | PRN
  Administered 2016-10-06 – 2016-10-07 (×4): 10 mg via ORAL
  Filled 2016-10-06 (×4): qty 2

## 2016-10-06 NOTE — Progress Notes (Addendum)
[facility] [place] at [facility]   PATIENT NAME: Jim [name]    MR#:  [id number]  DATE OF BIRTH:  [birth year]  SUBJECTIVE:  CHIEF COMPLAINT:   Chief Complaint  Patient presents with  . Acute Renal Failure   - patient continues to be sleepy/ lethargic, withdrawn when aroused - complains of pain in his legs and 'wants it to be over with'  REVIEW OF SYSTEMS:  Review of Systems  Constitutional: Positive for malaise/fatigue. Negative for chills and fever.  HENT: Negative for ear discharge, ear pain and nosebleeds.   Eyes: Negative for blurred vision and double vision.  Respiratory: Negative for cough, shortness of breath and wheezing.   Cardiovascular: Negative for chest pain, palpitations and leg swelling.  Gastrointestinal: Negative for abdominal pain, constipation, diarrhea, nausea and vomiting.  Genitourinary: Negative for dysuria.  Musculoskeletal: Positive for joint pain and myalgias.  Neurological: Negative for dizziness, sensory change, speech change, focal weakness, seizures and headaches.  Psychiatric/Behavioral: Negative for depression.    DRUG ALLERGIES:   Allergies  Allergen Reactions  . Penicillins Rash    VITALS:  Blood pressure (!) 124/48, pulse 79, temperature 99 F (37.2 C), temperature source Oral, resp. rate 20, height 6' (1.829 m), weight 78.6 kg (173 lb 3.2 oz), SpO2 96 %.  PHYSICAL EXAMINATION:  Physical Exam  GENERAL:  82 y.o.-year-old patient lying in the bed with no acute distress.  EYES: Pupils equal, round, reactive to light and accommodation. No scleral icterus. Extraocular muscles intact.  HEENT: Head atraumatic, normocephalic. Oropharynx and nasopharynx clear.  NECK:  Supple, no jugular venous distention. No thyroid enlargement, no tenderness.  LUNGS: Normal breath sounds bilaterally, no wheezing, rales,rhonchi or crepitation. No use of accessory muscles of respiration.  Decreased bibasilar breath sounds CARDIOVASCULAR:  S1, S2 normal. No murmurs, rubs, or gallops.  ABDOMEN: Soft, nontender, nondistended. Bowel sounds present. No organomegaly or mass.  EXTREMITIES: No pedal edema, cyanosis, or clubbing. Left leg BKA NEUROLOGIC: Cranial nerves II through XII are intact. Muscle strength 5/5 in all extremities. Sensation intact. Gait not checked. Global weakness. PSYCHIATRIC: The patient is alert and oriented x 3. Depressed and waithdrawn SKIN: No obvious rash, lesion, or ulcer.    LABORATORY PANEL:   CBC  Recent Labs Lab 10/02/16 2124  WBC 14.5*  HGB 14.2  HCT 41.8  PLT 321   ------------------------------------------------------------------------------------------------------------------  Chemistries   Recent Labs Lab 10/02/16 2124  10/05/16 0552  NA 137  < > 139  K 6.1*  < > 3.9  CL 101  < > 106  CO2 19*  < > 28  GLUCOSE 346*  < > 234*  BUN 73*  < > 13  CREATININE 4.98*  < > 0.55*  CALCIUM 8.0*  < > 7.2*  AST 13*  --   --   ALT 7*  --   --   ALKPHOS 84  --   --   BILITOT 1.2  --   --   < > = values in this interval not displayed. ------------------------------------------------------------------------------------------------------------------  Cardiac Enzymes No results for input(s): TROPONINI in the last 168 hours. ------------------------------------------------------------------------------------------------------------------  RADIOLOGY:  Dg Chest Port 1 View  Result Date: 10/06/2016 CLINICAL DATA:  Pneumonia EXAM: PORTABLE CHEST 1 VIEW COMPARISON:  09/18/2016 FINDINGS: Low volume chest with indistinct bibasilar opacities. Asymmetric right-sided airspace disease has improved. Background emphysema. Chronic cardiomegaly. IMPRESSION: 1. Bibasilar opacity could be atelectasis, pneumonia, or pneumonitis. 2. Chronic cardiomegaly Electronically Signed   By: Kathrynn DuckingJonathon  Watts M.D.  On: 10/06/2016 07:25    EKG:   Orders placed or performed during the hospital encounter of 10/02/16  .  EKG 12-Lead  . EKG 12-Lead    ASSESSMENT AND PLAN:   82 year old male with past medical history significant for diabetes, left foot ulcer status post BKA, hypertension presented to hospital from rehabilitation facility secondary to urinary retention and acute renal failure.  #1 acute renal failure-likely secondary to urinary retention. Creatinine was greater than 4 on admission. -Improved after Foley catheter placed. -Renal ultrasound with normal renal parenchyma. -Continue Foley catheter at discharge until seen by urology in the office next week. -Continue Proscar and Flomax. -Discontinued IV fluids for now.  #2 hyperkalemia and metabolic acidosis on admission-secondary to renal failure. -Do not restart ACE inhibitor or ARB -Improved at this time  #3 hematuria-secondary to traumatic Foley insertion. Improved after flushing the catheter and also stopping  aspirin  #4 diabetes mellitus-restarted Lantus. Also on sliding scale insulin.  #5 Depression- appears depressed, discussed with son, feels lonely. Started celexa Palliative care consulted. Normal TSH  #6 DVT prophylaxis- restart subcutaneous lovenox as hematuria cleared  Overall declining, patient also withdrawn. Not sure if able to participate in rehab. Palliative care consulted today Disposition plans after palliative care eval.   All the records are reviewed and case discussed with Care Management/Social Workerr. Management plans discussed with the patient, family and they are in agreement.  CODE STATUS: DNR  TOTAL TIME TAKING CARE OF THIS PATIENT: 37 minutes.   POSSIBLE D/C ?, DEPENDING ON CLINICAL CONDITION.   Cathalina Barcia M.D on 10/06/2016 at 12:33 PM  Between 7am to 6pm - Pager - [phone]  After 6pm go to [email or web address] - password [employer]  [facility] [place] [facility]  Office  [phone]  CC: Primary care physician; Leanna Sato, MD

## 2016-10-06 NOTE — Clinical Social Work Note (Signed)
CSW called to check on prior auth request sent late Friday and was informed that the request had been "forgotten" about and that they would be putting a rush on it this afternoon.  York SpanielMonica Neelie Welshans MSW,LCSW [phone]

## 2016-10-06 NOTE — Progress Notes (Signed)
Chaplain visited patient whom he had seen before to find out how he was doing, but the patient was asleep. Chaplain offered silent prayers for the patient and was hoping to make another visit soon.    10/06/16 1400  Clinical Encounter Type  Visited With Patient  Visit Type Follow-up;Social support  Referral From Nurse  Spiritual Encounters  Spiritual Needs Prayer

## 2016-10-06 NOTE — Consult Note (Signed)
Consultation Note Date: 10/06/2016   Patient Name: Jim [name]  DOB: 05-28-1930  MRN: [id number]  Age / Sex: 82 y.o., male  PCP: Leanna Sato, MD Referring Physician: Enid Baas, MD  Reason for Consultation: Establishing goals of care and Psychosocial/spiritual support  HPI/Patient Profile:   82 y.o. male  admitted on 10/02/2016  with past medical history of diabetes presents emergency department from his rehabilitation facility due to abnormal labs indicating acute renal failure.  The patient recently been discharged from the hospital for pneumonia and had follow-up labs which revealed increased creatinine.    Bladder scan revealed at least 900 mL of retained urine. Urinary catheter placed 1500 mL of urine as well as clotted blood drained.   Per family has had continued physical, functional and cognitive decline over the past year.  Family reports poor quality of life, loss of independent and loneliness. He has chronic pain.  Patient tells his family and myself today he is "tired of the pain" and supports allowing a natural death.   Clinical Assessment and Goals of Care:  This NP Lorinda Creed reviewed medical records, received report from team, assessed the patient and then meet at the patient's bedside along with his son Jim [name]  to discuss diagnosis, prognosis, GOC, EOL wishes disposition and options.  A discussion was had today regarding advanced directives.  Concepts specific to code status, artifical feeding and hydration, continued IV antibiotics and rehospitalization was had.    Values and goals of care important to patient and family were attempted to be elicited.  MOST form completed   Concept of Hospice and Palliative Care were discussed  Natural trajectory and expectations at EOL were discussed.  Questions and concerns addressed.   Family encouraged to call with questions or  concerns.  PMT will continue to support holistically.   SUMMARY OF RECOMMENDATIONS    At this time patient verbalizes that he is not interested in life prolonging measures. He desires a natural death.   Open  to rehabilitation with Palliative services to follow at SNF.  Continue current  medications and f/u with Urology. If however he continues to fail to thrive.  Hospice services will be welcomed , focusing on comfort, quality and dignity.    Code Status/Advance Care Planning:  DNR   Symptom Management:   Pain:  Oxycodone 10 mg po every 4 hrs prn  Palliative Prophylaxis:   Aspiration, Bowel Regimen, Frequent Pain Assessment, Oral Care, Palliative Wound Care and Turn Reposition  Additional Recommendations (Limitations, Scope, Preferences):  Avoid Hospitalization  Psycho-social/Spiritual:   Desire for further Chaplaincy support:no  Additional Recommendations: Education on Hospice  Prognosis:   < 6 months  Discharge Planning: To Be Determined      Primary Diagnoses: Present on Admission: . AKI (acute kidney injury) (HCC)   I have reviewed the medical record, interviewed the patient and family, and examined the patient. The following aspects are pertinent.  Past Medical History:  Diagnosis Date  . Diabetes mellitus without complication (HCC)   .  Hypertension   . Right foot ulcer (HCC)    Social History   Social History  . Marital status: Widowed    Spouse name: N/A  . Number of children: N/A  . Years of education: N/A   Social History Main Topics  . Smoking status: Former Smoker    Types: Cigarettes    Quit date: 12/30/1995  . Smokeless tobacco: Never Used  . Alcohol use No  . Drug use: No  . Sexual activity: Not Asked   Other Topics Concern  . None   Social History Narrative  . None   No family history on file. Scheduled Meds: . citalopram  10 mg Oral Daily  . enoxaparin (LOVENOX) injection  40 mg Subcutaneous Q24H  . feeding supplement  (ENSURE ENLIVE)  237 mL Oral BID BM  . finasteride  5 mg Oral Daily  . fluconazole  100 mg Oral Daily  . insulin aspart  0-5 Units Subcutaneous QHS  . insulin aspart  0-9 Units Subcutaneous TID WC  . insulin glargine  12 Units Subcutaneous QHS  . senna-docusate  2 tablet Oral BID  . tamsulosin  0.4 mg Oral QPC supper   Continuous Infusions:  PRN Meds:.acetaminophen **OR** acetaminophen, LORazepam, ondansetron **OR** ondansetron (ZOFRAN) IV, oxyCODONE Medications Prior to Admission:  Prior to Admission medications   Medication Sig Start Date End Date Taking? Authorizing Provider  aspirin EC 81 MG tablet Take 81 mg by mouth daily.   Yes Historical Provider, MD  fluconazole (DIFLUCAN) 100 MG tablet Take 100 mg by mouth daily. 10/01/16 10/08/16 Yes Historical Provider, MD  LANTUS 100 UNIT/ML injection Inject 5 Units into the skin 2 (two) times daily.    Yes Historical Provider, MD  oxyCODONE (ROXICODONE) 15 MG immediate release tablet Take 0.5 tablets (7.5 mg total) by mouth every 4 (four) hours as needed for moderate pain. Patient taking differently: Take 7.5 mg by mouth every 6 (six) hours as needed for pain.  09/26/16  Yes Wyatt Haste, MD  polyethylene glycol [facility] / GLYCOLAX) packet Take 17 g by mouth daily. Patient taking differently: Take 17 g by mouth 2 (two) times daily.  09/27/16  Yes Enedina Finner, MD  sennosides-docusate sodium (SENOKOT-S) 8.6-50 MG tablet Take 1 tablet by mouth 2 (two) times daily.   Yes Historical Provider, MD  amoxicillin-clavulanate (AUGMENTIN) 875-125 MG tablet Take 1 tablet by mouth every 12 (twelve) hours. 09/26/16   Enedina Finner, MD  aspirin 325 MG tablet Take 1 tablet (325 mg total) by mouth daily. Patient not taking: Reported on 10/02/2016 09/27/16   Enedina Finner, MD   Allergies  Allergen Reactions  . Penicillins Rash   Review of Systems  Unable to perform ROS: Acuity of condition    Physical Exam  Vital Signs: BP (!) 115/49   Pulse 69   Temp 98.4 F  (36.9 C) (Oral)   Resp 20   Ht 6' (1.829 m)   Wt 78.6 kg (173 lb 3.2 oz)   SpO2 97%   BMI 23.49 kg/m  Pain Assessment: 0-10 POSS *See Group Information*: S-Acceptable,Sleep, easy to arouse Pain Score: 5    SpO2: SpO2: 97 % O2 Device:SpO2: 97 % O2 Flow Rate: .   IO: Intake/output summary:  Intake/Output Summary (Last 24 hours) at 10/06/16 1724 Last data filed at 10/06/16 1426  Gross per 24 hour  Intake              577 ml  Output  2001 ml  Net            -1424 ml    LBM: Last BM Date: 10/06/16 Baseline Weight: Weight: 78 kg (172 lb) Most recent weight: Weight: 78.6 kg (173 lb 3.2 oz)      Palliative Assessment/Data: 30 % at best   Discussed with Dr Nemiah CommanderKalisetti  Time In: 1200 Time Out: 1315 Time Total: 75 min Greater than 50%  of this time was spent counseling and coordinating care related to the above assessment and plan.  Signed by: Lorinda CreedLARACH, Nyrah Demos, NP   Please contact Palliative Medicine Team phone at [phone] for questions and concerns.  For individual provider: See Loretha StaplerAmion

## 2016-10-06 NOTE — Telephone Encounter (Signed)
No problem, will make it happen!   Please arrange for this patient to be seen/ evaluated in 1 week in our office for evaluation/ possible voiding trial.   He was admitted to the hospital with renal failure, urinary retention, and hematuria noted at the time of Foley placement.    Vanna ScotlandAshley Brandon, MD   Previous Messages    ----- Message -----  From: Lamont DowdySarath Kolluru, MD  Sent: 10/03/2016  2:23 PM  To: Vanna ScotlandAshley Brandon, MD   Patient will need follow up in next two weeks for obstructive uropathy and hematuria     Done,spoke with patient's son

## 2016-10-06 NOTE — Clinical Social Work Note (Signed)
[facility] stating that patient's case would be sent to their medical director for review due to the concern that patient may not benefit from STR. CSW has updated MD. York SpanielMonica Dorine Duffey MSW,LCSW [phone]

## 2016-10-06 NOTE — Progress Notes (Signed)
Inpatient Diabetes Program Recommendations  AACE/ADA: New Consensus Statement on Inpatient Glycemic Control (2015)  Target Ranges:  Prepandial:   less than 140 mg/dL      Peak postprandial:   less than 180 mg/dL (1-2 hours)      Critically ill patients:  140 - 180 mg/dL   Results for Whitmoyer, Jakeb L (MRN [id number]) as of 10/06/2016 10:13  Ref. Range 10/05/2016 08:03 10/05/2016 11:26 10/05/2016 15:59 10/05/2016 21:04  Glucose-Capillary Latest Ref Range: 65 - 99 mg/dL 409194 (H) 811300 (H) 914317 (H) 250 (H)   Results for Zavadil, Jaimon L (MRN [id number]) as of 10/06/2016 10:13  Ref. Range 10/06/2016 07:32  Glucose-Capillary Latest Ref Range: 65 - 99 mg/dL 086203 (H)    Home DM Meds: Lantus 5 units BID  Current Insulin Orders: Lantus 8 units QHS      Novolog Sensitive Correction Scale/ SSI (0-9 units) TID AC + HS      MD- Please consider the following in-hospital insulin adjustments:  1. Increase Lantus to 10 units QHS (home dose is 5 units bid)  2. Start low dose Novolog Meal Coverage: Novolog 4 units tid with meals (hold if pt eats <50% of meal)      --Will follow patient during hospitalization--  Ambrose FinlandJeannine Johnston Catricia Scheerer RN, MSN, CDE Diabetes Coordinator Inpatient Glycemic Control Team Team Pager: [phone] (8a-5p)

## 2016-10-07 ENCOUNTER — Encounter
Admission: RE | Admit: 2016-10-07 | Discharge: 2016-10-07 | Disposition: A | Discharge status: Home / Self Care | Payer: Medicare HMO | Source: Ambulatory Visit | Attending: Internal Medicine | Admitting: Internal Medicine

## 2016-10-07 DIAGNOSIS — Z66 Do not resuscitate: Secondary | ICD-10-CM

## 2016-10-07 DIAGNOSIS — G629 Polyneuropathy, unspecified: Secondary | ICD-10-CM | POA: Insufficient documentation

## 2016-10-07 DIAGNOSIS — M6281 Muscle weakness (generalized): Secondary | ICD-10-CM

## 2016-10-07 DIAGNOSIS — Z515 Encounter for palliative care: Secondary | ICD-10-CM

## 2016-10-07 DIAGNOSIS — R410 Disorientation, unspecified: Secondary | ICD-10-CM | POA: Insufficient documentation

## 2016-10-07 DIAGNOSIS — N139 Obstructive and reflux uropathy, unspecified: Secondary | ICD-10-CM

## 2016-10-07 DIAGNOSIS — R634 Abnormal weight loss: Secondary | ICD-10-CM | POA: Insufficient documentation

## 2016-10-07 LAB — GLUCOSE, CAPILLARY
Glucose-Capillary: 121 mg/dL — ABNORMAL HIGH (ref 65–99)
Glucose-Capillary: 145 mg/dL — ABNORMAL HIGH (ref 65–99)

## 2016-10-07 MED ORDER — POLYETHYLENE GLYCOL 3350 17 G PO PACK: 17.0000 g | PACK | Freq: Every day | ORAL | 0 refills | Status: AC | PRN

## 2016-10-07 MED ORDER — TAMSULOSIN HCL 0.4 MG PO CAPS: 0.4000 mg | ORAL_CAPSULE | Freq: Every day | ORAL | 2 refills | Status: AC

## 2016-10-07 MED ORDER — POLYETHYLENE GLYCOL 3350 17 G PO PACK: 17.0000 g | PACK | Freq: Every day | ORAL | Status: DC | PRN

## 2016-10-07 MED ORDER — FINASTERIDE 5 MG PO TABS: 5.0000 mg | ORAL_TABLET | Freq: Every day | ORAL | 2 refills | Status: AC

## 2016-10-07 MED ORDER — LORAZEPAM 1 MG PO TABS: 1.0000 mg | ORAL_TABLET | Freq: Every evening | ORAL | 0 refills | Status: AC | PRN

## 2016-10-07 MED ORDER — INFLUENZA VAC SPLIT QUAD 0.5 ML IM SUSY: 0.5000 mL | PREFILLED_SYRINGE | INTRAMUSCULAR | Status: DC | PRN

## 2016-10-07 MED ORDER — LANTUS 100 UNIT/ML ~~LOC~~ SOLN: 12.0000 [IU] | Freq: Every day | SUBCUTANEOUS | 11 refills | Status: AC

## 2016-10-07 MED ORDER — OXYCODONE HCL 10 MG PO TABS: 10.0000 mg | ORAL_TABLET | ORAL | 0 refills | Status: AC | PRN

## 2016-10-07 MED ORDER — CITALOPRAM HYDROBROMIDE 10 MG PO TABS: 10.0000 mg | ORAL_TABLET | Freq: Every day | ORAL | 2 refills | Status: AC

## 2016-10-07 MED ORDER — ACETAMINOPHEN 325 MG PO TABS: 650.0000 mg | ORAL_TABLET | Freq: Four times a day (QID) | ORAL | 2 refills | Status: AC | PRN

## 2016-10-07 NOTE — Progress Notes (Signed)
[facility] IV site [facility]. Concerns addressed. EMS called.

## 2016-10-07 NOTE — Plan of Care (Signed)
Problem: Health Behavior/Discharge Planning: Goal: Ability to manage health-related needs will improve Outcome: Adequate for Discharge Patient to be discharged to skilled nursing facility

## 2016-10-07 NOTE — Discharge Summary (Addendum)
[facility] [place] at [facility]   PATIENT NAME: Jim [name]    MR#:  [id number]  DATE OF BIRTH:  June 11, 1930  DATE OF ADMISSION:  10/02/2016   ADMITTING PHYSICIAN: Arnaldo NatalMichael S Diamond, MD  DATE OF DISCHARGE: 10/07/2016  PRIMARY CARE PHYSICIAN: Leanna SatoMILES,LINDA M, MD   ADMISSION DIAGNOSIS:   Obstructive uropathy [N13.9] AKI (acute kidney injury) (HCC) [N17.9]  DISCHARGE DIAGNOSIS:   Active Problems:   AKI (acute kidney injury) (HCC)   SECONDARY DIAGNOSIS:   Past Medical History:  Diagnosis Date  . Diabetes mellitus without complication (HCC)   . Hypertension   . Right foot ulcer [facility](HCC)     HOSPITAL COURSE:   82 year old male with past medical history significant for diabetes, left foot ulcer status post BKA, hypertension presented to hospital from rehabilitation facility secondary to urinary retention and acute renal failure.  #1 acute renal failure-likely secondary to urinary retention. Creatinine was greater than 4 on admission. Creatinine is normalized now -Improved after Foley catheter placed. -Renal ultrasound with normal renal parenchyma. -Continue Foley catheter at discharge until seen by urology in the office next week. -Continue Proscar and Flomax.  #2 hyperkalemia and metabolic acidosis on admission-secondary to renal failure. -Do not restart ACE inhibitor or ARB -Improved at this time  #3 hematuria-secondary to traumatic Foley insertion. Improved after flushing the catheter and also stopping  aspirin  #4 diabetes mellitus-restarted Lantus.   #5 Depression- appears depressed,  feels lonely. Started celexa Palliative care follow-up at the rehabilitation Normal TSH   Continues to have a good appetite and eating well. Hopefully depression improves with medication and will be able to participate in rehabilitation Palliative care follow-up.  DISCHARGE CONDITIONS:   Guarded CONSULTS OBTAINED:   Treatment Team:  Enid Baasadhika  Lindell Tussey, MD  DRUG ALLERGIES:   Allergies  Allergen Reactions  . Penicillins Rash   DISCHARGE MEDICATIONS:     Medication List    STOP taking these medications   amoxicillin-clavulanate 875-125 MG tablet Commonly known as:  AUGMENTIN   aspirin 325 MG tablet   aspirin EC 81 MG tablet   fluconazole 100 MG tablet Commonly known as:  DIFLUCAN     TAKE these medications   acetaminophen 325 MG tablet Commonly known as:  TYLENOL Take 2 tablets (650 mg total) by mouth every 6 (six) hours as needed for mild pain or headache (or Fever >/= 101).   citalopram 10 MG tablet Commonly known as:  CELEXA Take 1 tablet (10 mg total) by mouth daily.   finasteride 5 MG tablet Commonly known as:  PROSCAR Take 1 tablet (5 mg total) by mouth daily.   LANTUS 100 UNIT/ML injection Generic drug:  insulin glargine Inject 0.12 mLs (12 Units total) into the skin at bedtime. What changed:  how much to take  when to take this   LORazepam 1 MG tablet Commonly known as:  ATIVAN Take 1 tablet (1 mg total) by mouth at bedtime as needed (imsonia or anxiety).   Oxycodone HCl 10 MG Tabs Take 1 tablet (10 mg total) by mouth every 4 (four) hours as needed for moderate pain or severe pain. What changed:  medication strength  how much to take  reasons to take this   polyethylene glycol packet Commonly known as:  MIRALAX / GLYCOLAX Take 17 g by mouth daily as needed for mild constipation. What changed:  when to take this  reasons to take this   sennosides-docusate sodium 8.6-50 MG tablet Commonly known as:  SENOKOT-S Take 1 tablet by mouth 2 (two) times daily.   tamsulosin 0.4 MG Caps capsule Commonly known as:  FLOMAX Take 1 capsule (0.4 mg total) by mouth daily after supper.        DISCHARGE INSTRUCTIONS:   1. Urology follow-up in 1 week 2. PCP follow-up appointments 2 weeks 3. Physical Therapy 4. Palliative care follow up  DIET:   Cardiac diet  ACTIVITY:    Activity as tolerated  OXYGEN:   Home Oxygen: No.  Oxygen Delivery: room air  DISCHARGE LOCATION:   nursing home   If you experience worsening of your admission symptoms, develop shortness of breath, life threatening emergency, suicidal or homicidal thoughts you must seek medical attention immediately by calling 911 or calling your MD immediately  if symptoms less severe.  You Must read complete instructions/literature along with all the possible adverse reactions/side effects for all the Medicines you take and that have been prescribed to you. Take any new Medicines after you have completely understood and accpet all the possible adverse reactions/side effects.   Please note  You were cared for by a hospitalist during your hospital stay. If you have any questions about your discharge medications or the care you received while you were in the hospital after you are discharged, you can call the unit and asked to speak with the hospitalist on call if the hospitalist that took care of you is not available. Once you are discharged, your primary care physician will handle any further medical issues. Please note that NO REFILLS for any discharge medications will be authorized once you are discharged, as it is imperative that you return to your primary care physician (or establish a relationship with a primary care physician if you do not have one) for your aftercare needs so that they can reassess your need for medications and monitor your lab values.    On the day of Discharge:  VITAL SIGNS:   Blood pressure (!) 105/50, pulse 72, temperature 98.2 F (36.8 C), temperature source Oral, resp. rate 20, height 6' (1.829 m), weight 77.5 kg (170 lb 12.8 oz), SpO2 98 %.  PHYSICAL EXAMINATION:    GENERAL:  82 y.o.-year-old patient lying in the bed with no acute distress.  EYES: Pupils equal, round, reactive to light and accommodation. No scleral icterus. Extraocular muscles intact.  HEENT: Head  atraumatic, normocephalic. Oropharynx and nasopharynx clear.  NECK:  Supple, no jugular venous distention. No thyroid enlargement, no tenderness.  LUNGS: Normal breath sounds bilaterally, no wheezing, rales,rhonchi or crepitation. No use of accessory muscles of respiration.  Decreased bibasilar breath sounds CARDIOVASCULAR: S1, S2 normal. No murmurs, rubs, or gallops.  ABDOMEN: Soft, nontender, nondistended. Bowel sounds present. No organomegaly or mass.  EXTREMITIES: No pedal edema, cyanosis, or clubbing. Left leg BKA NEUROLOGIC: Cranial nerves II through XII are intact. Muscle strength 5/5 in all extremities. Sensation intact. Gait not checked. Global weakness. PSYCHIATRIC: The patient is alert and oriented x 3. Depressed SKIN: No obvious rash, lesion, or ulcer.   DATA REVIEW:   CBC  Recent Labs Lab 10/02/16 2124  WBC 14.5*  HGB 14.2  HCT 41.8  PLT 321    Chemistries   Recent Labs Lab 10/02/16 2124  10/05/16 0552  NA 137  < > 139  K 6.1*  < > 3.9  CL 101  < > 106  CO2 19*  < > 28  GLUCOSE 346*  < > 234*  BUN 73*  < > 13  CREATININE 4.98*  < >  0.55*  CALCIUM 8.0*  < > 7.2*  AST 13*  --   --   ALT 7*  --   --   ALKPHOS 84  --   --   BILITOT 1.2  --   --   < > = values in this interval not displayed.   Microbiology Results  Results for orders placed or performed during the hospital encounter of 10/02/16  Urine culture     Status: Abnormal   Collection Time: 10/02/16  9:24 PM  Result Value Ref Range Status   Specimen Description URINE, RANDOM  Final   Special Requests NONE  Final   Culture 50,000 COLONIES/mL YEAST (A)  Final   Report Status 10/04/2016 FINAL  Final    RADIOLOGY:  No results found.   Management plans discussed with the patient, family and they are in agreement.  CODE STATUS:     Code Status Orders        Start     Ordered   10/03/16 0234  Do not attempt resuscitation (DNR)  Continuous    Question Answer Comment  In the event of  cardiac or respiratory ARREST Do not call a "code blue"   In the event of cardiac or respiratory ARREST Do not perform Intubation, CPR, defibrillation or ACLS   In the event of cardiac or respiratory ARREST Use medication by any route, position, wound care, and other measures to relive pain and suffering. May use oxygen, suction and manual treatment of airway obstruction as needed for comfort.      10/03/16 0233    Code Status History    Date Active Date Inactive Code Status Order ID Comments User Context   09/24/2016  4:10 PM 09/26/2016  9:53 PM Full Code [id number]  Shaune Pollack, MD Inpatient   09/16/2016  9:13 PM 09/23/2016  5:20 PM Full Code [id number]  Enid Baas, MD ED    Advance Directive Documentation   Flowsheet Row Most Recent Value  Type of Advance Directive  Out of facility DNR (pink MOST or yellow form), Healthcare Power of Attorney  Pre-existing out of facility DNR order (yellow form or pink MOST form)  Physician notified to receive inpatient order  "MOST" Form in Place?  No data      TOTAL TIME TAKING CARE OF THIS PATIENT: 37 minutes.    Kacia Halley M.D on 10/07/2016 at 7:58 AM  Between 7am to 6pm - Pager - [phone]  After 6pm go to [email or web address] - Social research officer, government  [facility] [place] [facility]  Office  [phone]  CC: Primary care physician; Leanna Sato, MD   Note: This dictation was prepared with Dragon dictation along with smaller phrase technology. Any transcriptional errors that result from this process are unintentional.

## 2016-10-07 NOTE — Progress Notes (Signed)
EMS arrived and picked patient up.

## 2016-10-07 NOTE — Clinical Social Work Note (Signed)
CSW spoke to patient's son this morning regarding receiving [employer] authorization for patient to go to rehab at [facility]. CSW also informed patient's son that [facility] currently does not have any long term beds available and that after patient finishes his rehab, patient may need to transition to another facility for long term care. Patient's son verbalized understanding. Discharge information sent and nurse to call report.  York SpanielMonica Yona Stansbury MSW,LCSW [phone]

## 2016-10-08 DIAGNOSIS — E1122 Type 2 diabetes mellitus with diabetic chronic kidney disease: Secondary | ICD-10-CM | POA: Diagnosis not present

## 2016-10-08 DIAGNOSIS — G8929 Other chronic pain: Secondary | ICD-10-CM | POA: Diagnosis not present

## 2016-10-08 DIAGNOSIS — R54 Age-related physical debility: Secondary | ICD-10-CM | POA: Diagnosis not present

## 2016-10-08 DIAGNOSIS — Z89529 Acquired absence of unspecified knee: Secondary | ICD-10-CM | POA: Diagnosis not present

## 2016-10-08 DIAGNOSIS — Z515 Encounter for palliative care: Secondary | ICD-10-CM | POA: Diagnosis not present

## 2016-10-08 DIAGNOSIS — I129 Hypertensive chronic kidney disease with stage 1 through stage 4 chronic kidney disease, or unspecified chronic kidney disease: Secondary | ICD-10-CM | POA: Diagnosis not present

## 2016-10-08 DIAGNOSIS — N189 Chronic kidney disease, unspecified: Secondary | ICD-10-CM | POA: Diagnosis not present

## 2016-10-08 DIAGNOSIS — Z66 Do not resuscitate: Secondary | ICD-10-CM | POA: Diagnosis not present

## 2016-10-09 DIAGNOSIS — R634 Abnormal weight loss: Secondary | ICD-10-CM | POA: Diagnosis not present

## 2016-10-09 DIAGNOSIS — R410 Disorientation, unspecified: Secondary | ICD-10-CM | POA: Diagnosis not present

## 2016-10-09 DIAGNOSIS — G629 Polyneuropathy, unspecified: Secondary | ICD-10-CM | POA: Diagnosis not present

## 2016-10-09 LAB — COMPREHENSIVE METABOLIC PANEL
ALBUMIN: 2.1 g/dL — AB (ref 3.5–5.0)
ALT: 9 U/L — AB (ref 17–63)
AST: 17 U/L (ref 15–41)
Alkaline Phosphatase: 65 U/L (ref 38–126)
Anion gap: 8 (ref 5–15)
BUN: 8 mg/dL (ref 6–20)
CHLORIDE: 106 mmol/L (ref 101–111)
CO2: 29 mmol/L (ref 22–32)
CREATININE: 0.66 mg/dL (ref 0.61–1.24)
Calcium: 8 mg/dL — ABNORMAL LOW (ref 8.9–10.3)
GFR calc Af Amer: >60 mL/min (ref >60)
GFR calc non Af Amer: >60 mL/min (ref >60)
GLUCOSE: 164 mg/dL — AB (ref 65–99)
POTASSIUM: 3.8 mmol/L (ref 3.5–5.1)
SODIUM: 143 mmol/L (ref 135–145)
Total Bilirubin: 0.4 mg/dL (ref 0.3–1.2)
Total Protein: 5.7 g/dL — ABNORMAL LOW (ref 6.5–8.1)

## 2016-10-09 LAB — VITAMIN B12: Vitamin B-12: 310 pg/mL (ref 180–914)

## 2016-10-09 LAB — TSH: TSH: 3.959 u[IU]/mL (ref 0.350–4.500)

## 2016-10-09 LAB — SEDIMENTATION RATE: Sed Rate: 46 mm/hr — ABNORMAL HIGH (ref 0–20)

## 2016-10-16 ENCOUNTER — Ambulatory Visit: Payer: Self-pay

## 2016-10-22 NOTE — Discharge Summary (Signed)
[facility] - [place] at [facility]  LATE ENTRY:  PATIENT NAME: Jim [name]    MR#:  [id number]  DATE OF BIRTH:  11-Jan-1930  DATE OF ADMISSION:  09/16/2016   ADMITTING PHYSICIAN: Enid Baas, MD  DATE OF DISCHARGE: 09/23/2016  2:20 PM  PRIMARY CARE PHYSICIAN: Leanna Sato, MD   ADMISSION DIAGNOSIS:  Community acquired pneumonia [J18.9] DISCHARGE DIAGNOSIS:  Active Problems:   Sepsis (HCC)   Pressure injury of skin  SECONDARY DIAGNOSIS:   Past Medical History:  Diagnosis Date  . Diabetes mellitus without complication (HCC)   . Hypertension   . Right foot ulcer [facility])    HOSPITAL COURSE:   Jim [name] is an 82 year old male with a history of diabetes, hypertension and right foot ulcer, motorized wheelchair dependent secondary to left leg amputation who was admitted on 09/16/2016 with a diagnosis of sepsis secondary to right middle and lower lobe community-acquired pneumonia. He was started on Rocephin and azithromycin, antitussive. Physical therapy was consulted secondary to frequent falls. Patient's hospital course was complicated by the identification of constipation with no bowel movement for 10 days and likely secondary to chronic opiate use. He was started on a bowel regimen. His hospital course was also complicated by some sundowning requiring a safety sitter and hypoglycemia for which his Lantus was held. During the admission he began to complain of worsening leg pain for which podiatry and wound care consultation were requested for consideration of further treatment and equipment needs. Podiatry provided a excisional debridement of the ulceration on the right foot and recommended daily wound care and outpatient follow-up. He recommended weightbearing on the right foot with pressure only on the heels offloaded ulceration on the forefoot. Patient refused physical therapy evaluation. On the day of discharge patient was symptomatically improved with  stabilization of his blood sugars. He was unable to be transported home via call our therefore EMS transport was provided. His discharge medications were as per the medication reconciliation.  DISCHARGE CONDITIONS:  Sepsis secondary to community-acquired pneumonia, present on admission Constipation Right foot ulcer stage IV Chronic right leg pain Hospital delirium Diabetes with hypoglycemia  CONSULTS OBTAINED:  Podiatry and physical therapy  DRUG ALLERGIES:   Allergies  Allergen Reactions  . Penicillins Rash   DISCHARGE MEDICATIONS:     Medication List    You have not been prescribed any medications.      DISCHARGE INSTRUCTIONS:  Follow-up primary care provider  DIET:  Heart healthy, carb controlled  DISCHARGE CONDITION:  Stable ACTIVITY:  Activity as tolerated, With weightbearing as per podiatry  OXYGEN:  Home Oxygen: No.  Oxygen Delivery: room air DISCHARGE LOCATION:  home   If you experience worsening of your admission symptoms, develop shortness of breath, life threatening emergency, suicidal or homicidal thoughts you must seek medical attention immediately by calling 911 or calling your MD immediately  if symptoms less severe.  You Must read complete instructions/literature along with all the possible adverse reactions/side effects for all the Medicines you take and that have been prescribed to you. Take any new Medicines after you have completely understood and accpet all the possible adverse reactions/side effects.   Please note  You were cared for by a hospitalist during your hospital stay. If you have any questions about your discharge medications or the care you received while you were in the hospital after you are discharged, you can call the unit and asked to speak with the hospitalist on call if the hospitalist that took care  of you is not available. Once you are discharged, your primary care physician will handle any further medical issues. Please note that  NO REFILLS for any discharge medications will be authorized once you are discharged, as it is imperative that you return to your primary care physician (or establish a relationship with a primary care physician if you do not have one) for your aftercare needs so that they can reassess your need for medications and monitor your lab values.    On the day of Discharge:  VITAL SIGNS:  Blood pressure 140/67, pulse (!) 102, temperature 97.7 F (36.5 C), temperature source Oral, resp. rate 18, height 6\' 1"  (1.854 m), weight 72.8 kg (160 lb 8 oz), SpO2 96 %. PHYSICAL EXAMINATION:  GENERAL:  82 y.o.-year-old patient lying in the bed with no acute distress.  EYES: Pupils equal, round, reactive to light and accommodation. No scleral icterus. Extraocular muscles intact.  HEENT: Head atraumatic, normocephalic. Oropharynx and nasopharynx clear.  NECK:  Supple, no jugular venous distention. No thyroid enlargement, no tenderness.  LUNGS: Normal breath sounds bilaterally, no wheezing, rales,rhonchi or crepitation. No use of accessory muscles of respiration.  CARDIOVASCULAR: S1, S2 normal. No murmurs, rubs, or gallops.  ABDOMEN: Soft, non-tender, non-distended. Bowel sounds present. No organomegaly or mass.  EXTREMITIES: No pedal edema, cyanosis, or clubbing.  NEUROLOGIC: Cranial nerves II through XII are intact. Muscle strength 5/5 in all extremities. Sensation intact. Gait not checked.  PSYCHIATRIC: The patient is alert and oriented x 3.  SKIN: No obvious rash, lesion, or ulcer.  DATA REVIEW:   CBC No results for input(s): WBC, HGB, HCT, PLT in the last 168 hours.  Chemistries  No results for input(s): NA, K, CL, CO2, GLUCOSE, BUN, CREATININE, CALCIUM, MG, AST, ALT, ALKPHOS, BILITOT in the last 168 hours.  Invalid input(s): GFRCGP   Microbiology Results  Results for orders placed or performed during the hospital encounter of 09/16/16  Blood culture (routine x 2)     Status: None   Collection Time:  09/16/16  2:45 PM  Result Value Ref Range Status   Specimen Description BLOOD RIGHT WRIST  Final   Special Requests BOTTLES DRAWN AEROBIC AND ANAEROBIC .5ML  Final   Culture NO GROWTH 5 DAYS  Final   Report Status 09/21/2016 FINAL  Final  Blood culture (routine x 2)     Status: None   Collection Time: 09/16/16  4:51 PM  Result Value Ref Range Status   Specimen Description BLOOD LEFT HAND  Final   Special Requests   Final    BOTTLES DRAWN AEROBIC AND ANAEROBIC AER .5ML ANA 1ML   Culture NO GROWTH 5 DAYS  Final   Report Status 09/21/2016 FINAL  Final    RADIOLOGY:  No results found.   Management plans discussed with the patient, family and they are in agreement.  CODE STATUS:  Code Status History    Date Active Date Inactive Code Status Order ID Comments User Context   10/03/2016  2:34 AM 10/07/2016  4:32 PM DNR [id number]  Arnaldo NatalMichael S Diamond, MD Inpatient   09/24/2016  4:10 PM 09/26/2016  9:53 PM Full Code [id number]  Shaune PollackQing Chen, MD Inpatient   09/16/2016  9:13 PM 09/23/2016  5:20 PM Full Code [id number]  Enid Baasadhika Kalisetti, MD ED    Questions for Most Recent Historical Code Status (Order [id number])    Question Answer Comment   In the event of cardiac or respiratory ARREST Do not call a "code blue"  In the event of cardiac or respiratory ARREST Do not perform Intubation, CPR, defibrillation or ACLS    In the event of cardiac or respiratory ARREST Use medication by any route, position, wound care, and other measures to relive pain and suffering. May use oxygen, suction and manual treatment of airway obstruction as needed for comfort.       TOTAL TIME TAKING CARE OF THIS PATIENT: 30 minutes.    Tonye Royalty M.D on 10/22/2016 at 11:29 PM After 6pm go to [email or web address] - Social research officer, government  [facility] [place] [facility]  Office  [phone]  CC: Primary care physician; Leanna Sato, MD   Note: This dictation was prepared with Dragon dictation along with smaller  phrase technology. Any transcriptional errors that result from this process are unintentional.

## 2016-10-29 DEATH — deceased

## 2017-03-01 IMAGING — CR DG CHEST 2V
2 series · 2 of 2 positions shown · non-contrast
Comparison: 05/09/2010

CLINICAL DATA: Fell this morning.  Shortness of breath.

EXAM:
CHEST  2 VIEW

[chest lat]
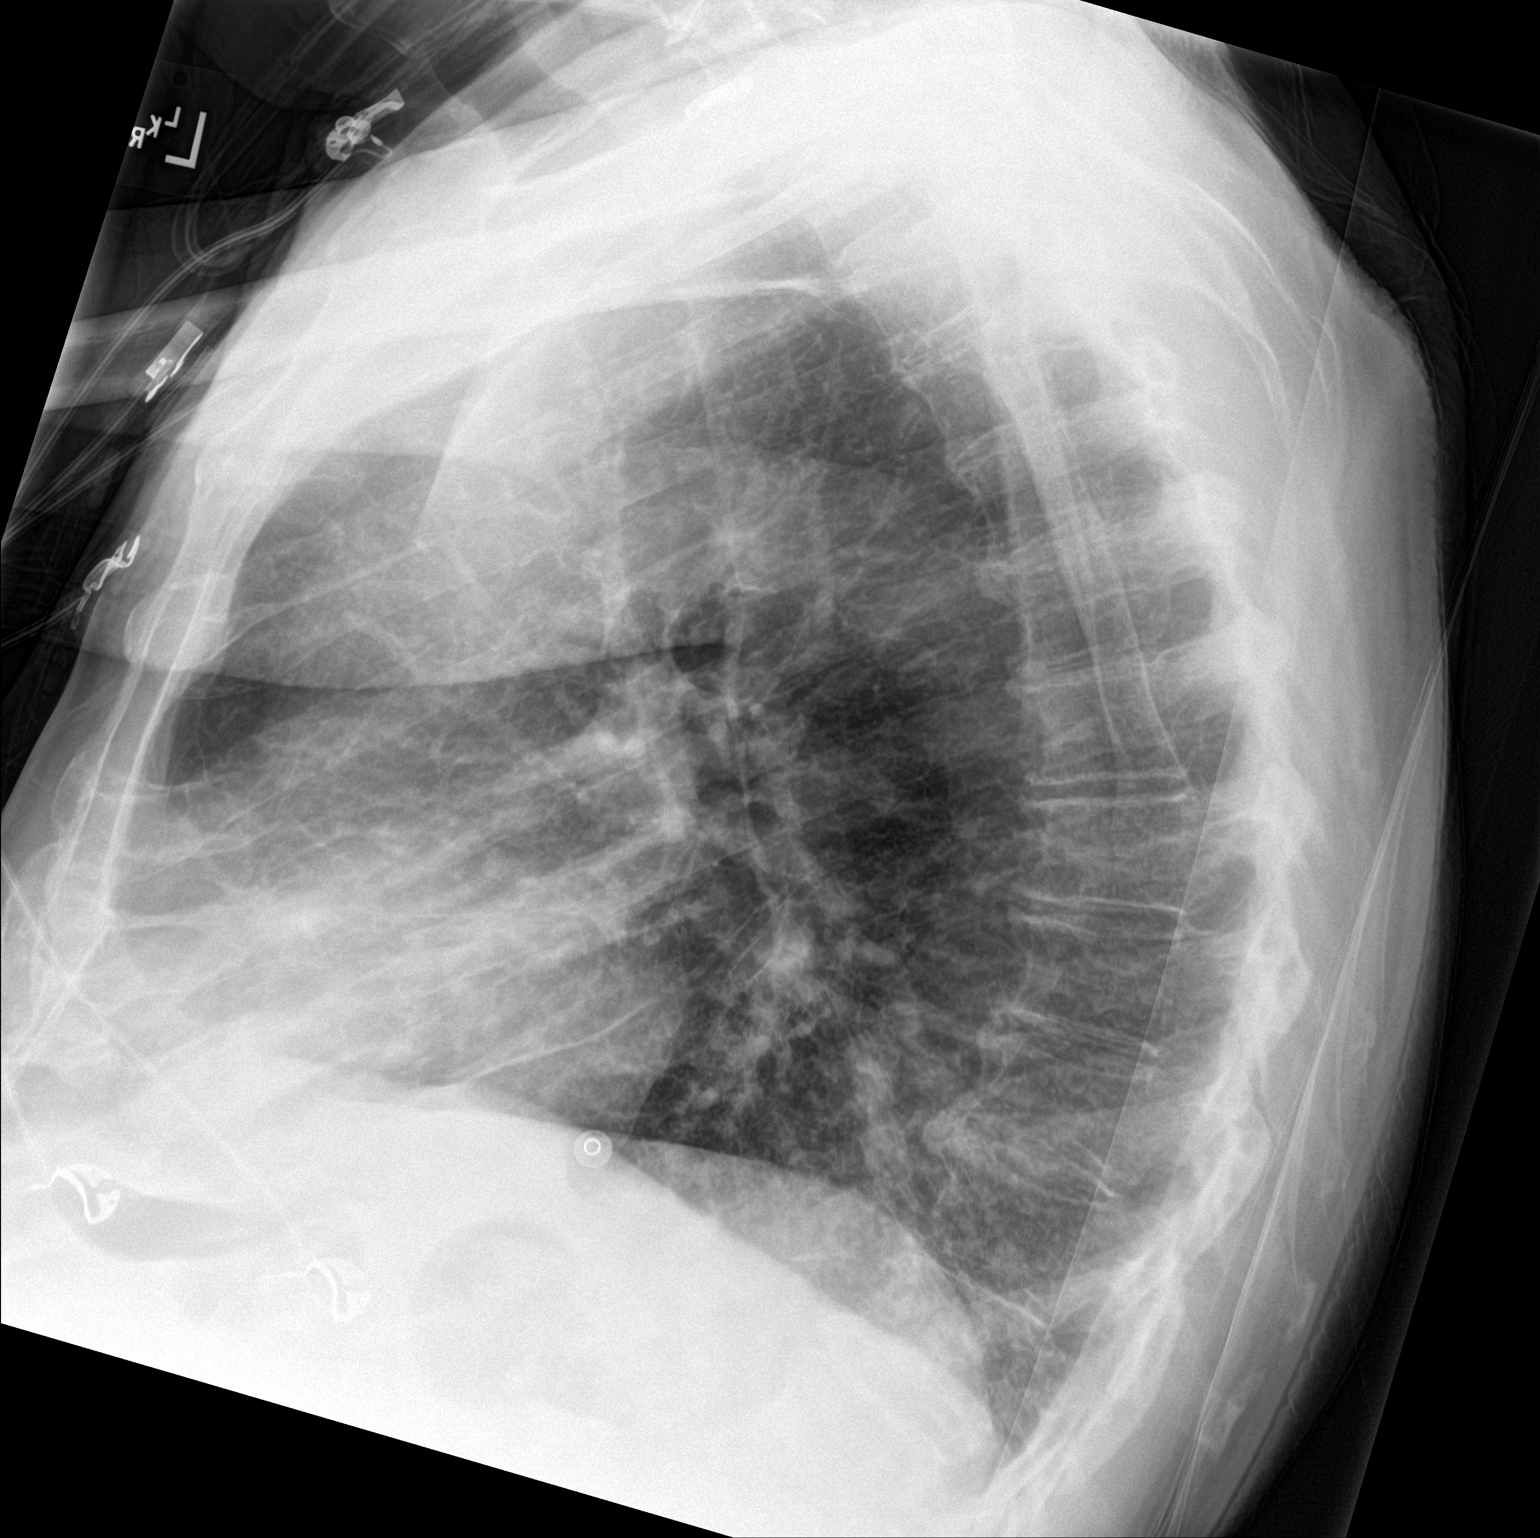

[chest ap]
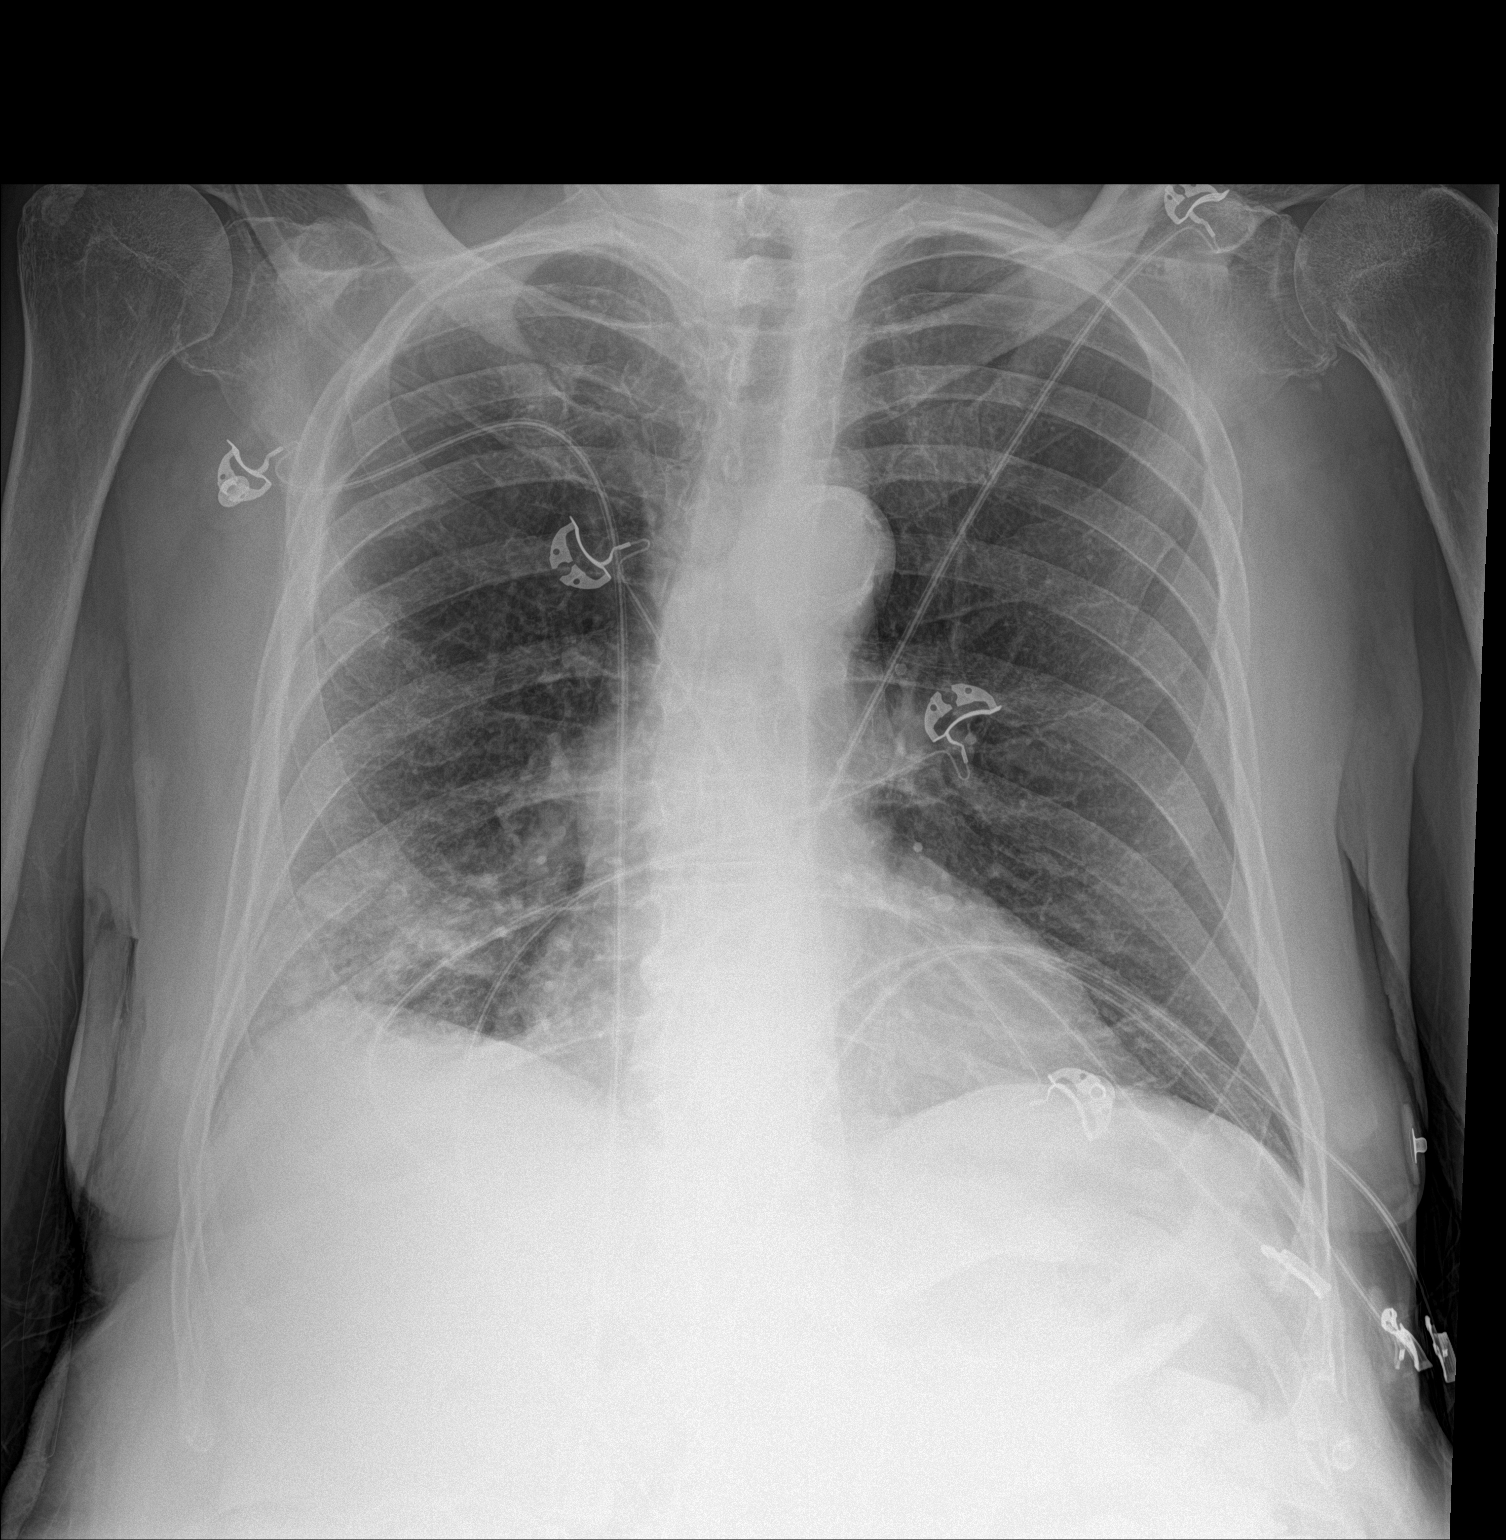

[2 of 2 positions shown; findings below may reference images not displayed]

FINDINGS: Artifact overlies the chest. Heart size is normal. There is aortic
atherosclerosis. The left lung is clear. There is right lower and
middle lobe pneumonia. Chronic degenerative changes affect the
spine.
IMPRESSION: Right lower and middle lobe pneumonia. No dense consolidation or
lobar collapse.

Aortic atherosclerosis.

## 2018-02-02 IMAGING — US US RENAL
1 series · 14 of 25 positions shown · non-contrast
Comparison: None.

CLINICAL DATA: Obstructive uropathy.

EXAM:
RENAL / URINARY TRACT ULTRASOUND COMPLETE

[Series 1: us renal · 0.28mm/px · 14 of 34 slices shown]
[im 1/34]
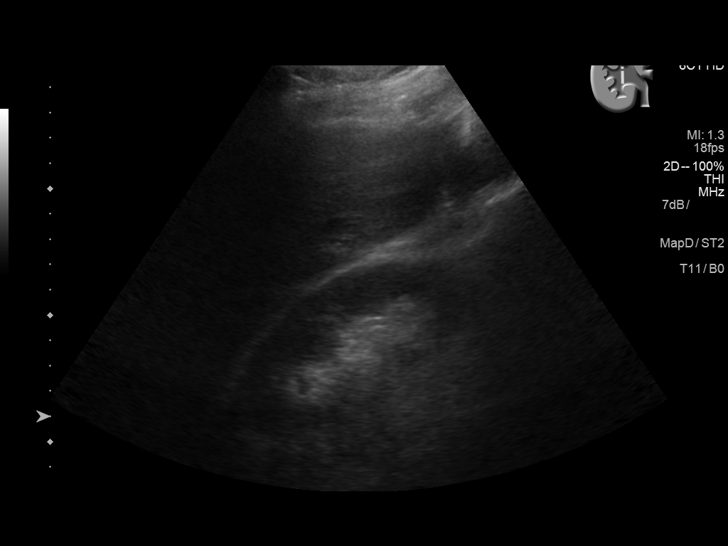
[im 3/34]
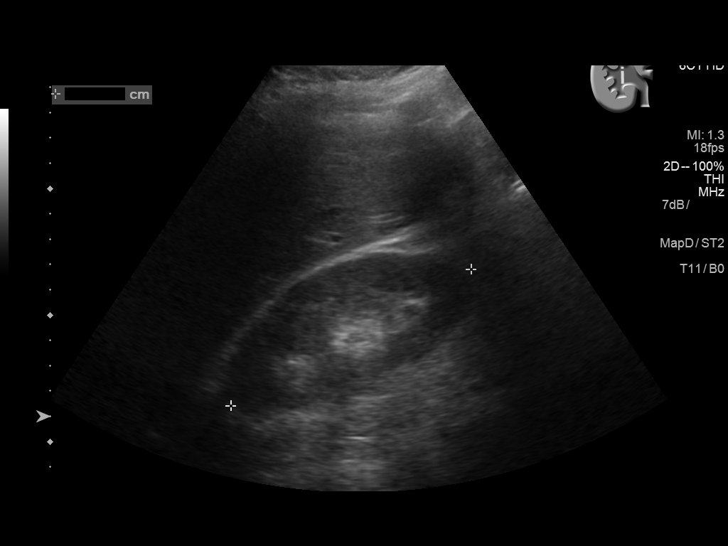
[im 6/34]
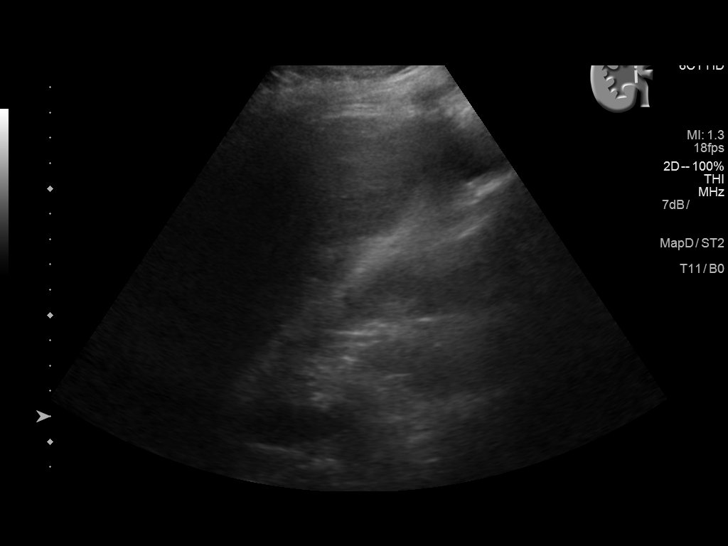
[im 9/34]
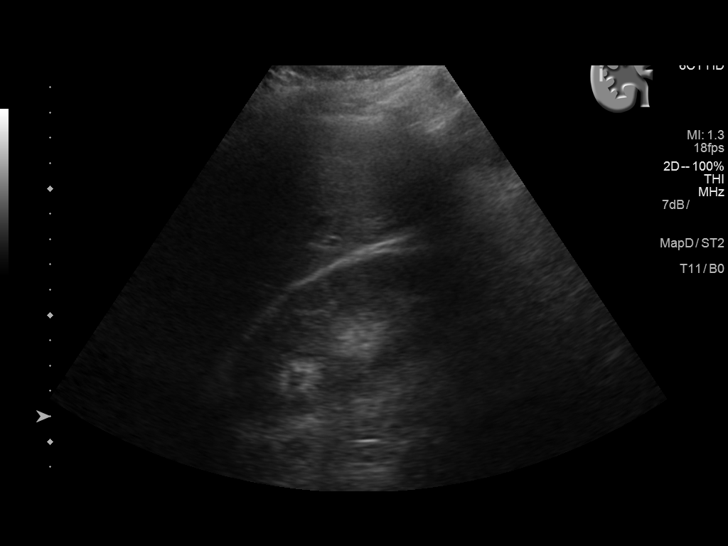
[im 12/34]
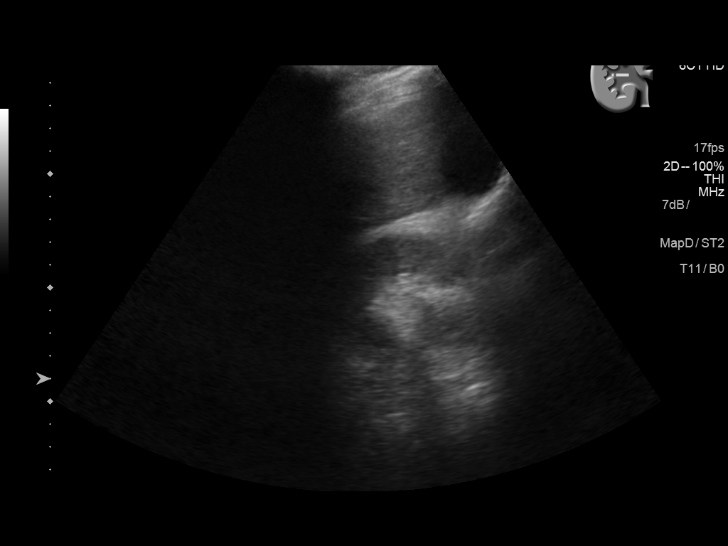
[im 13/34]
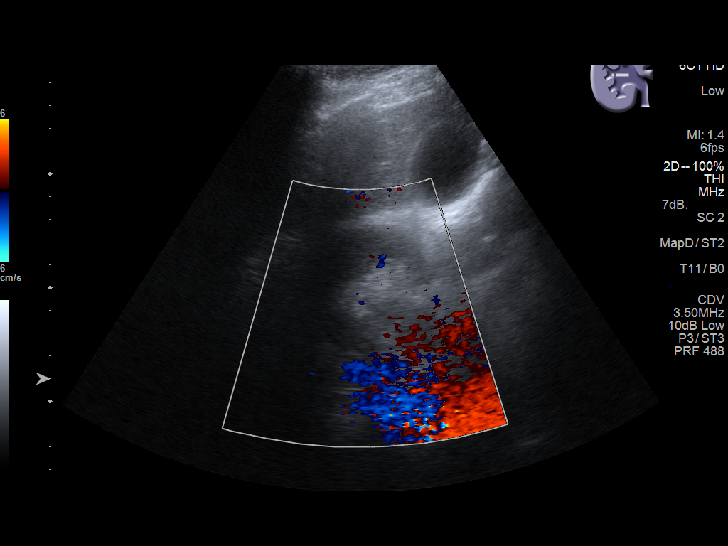
[im 16/34]
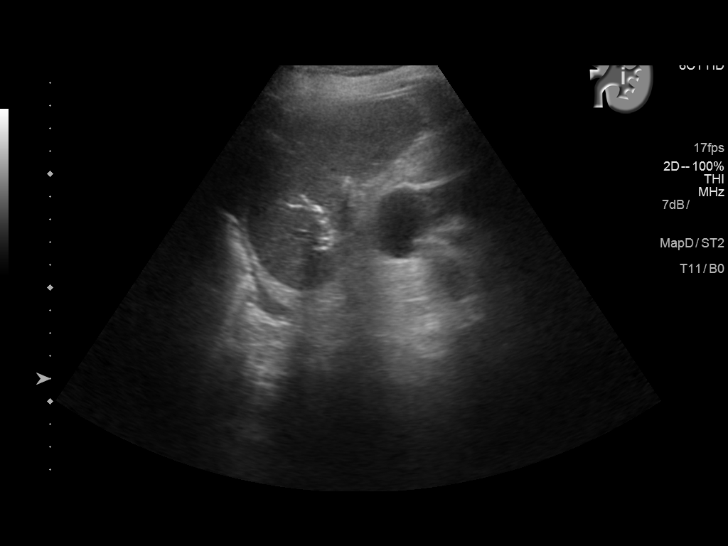
[im 18/34]
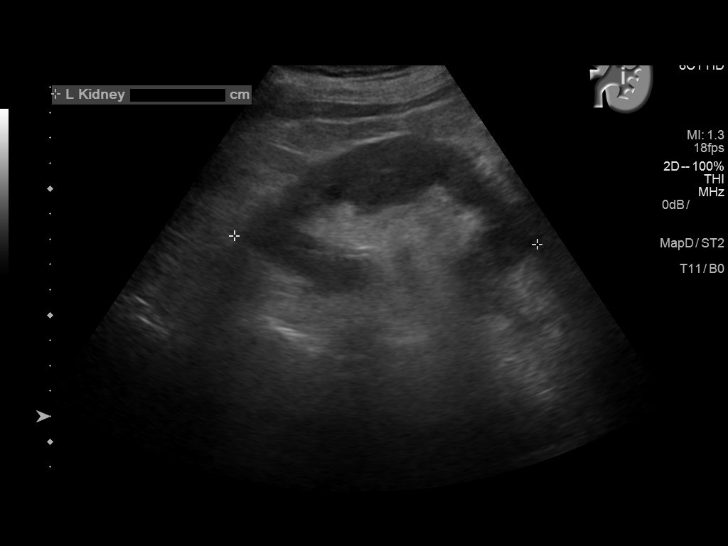
[im 21/34]
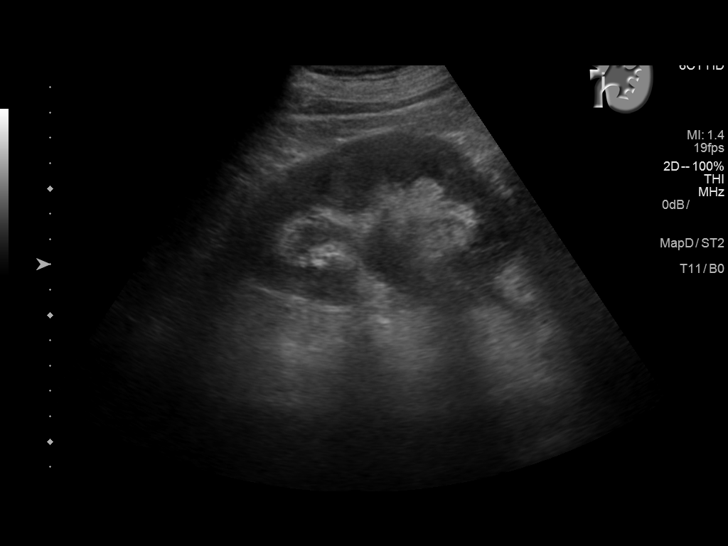
[im 23/34]
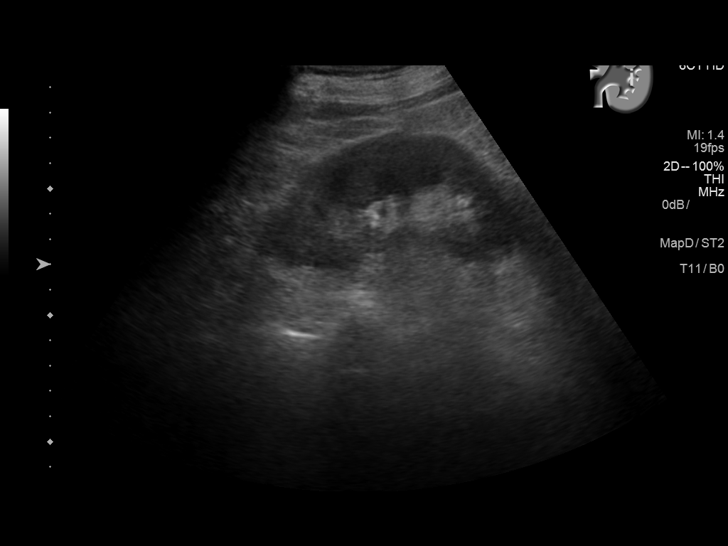
[im 25/34]
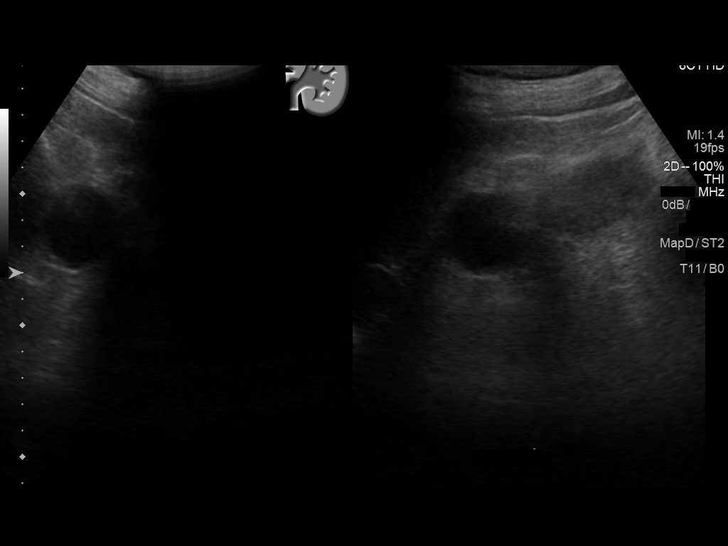
[im 28/34]
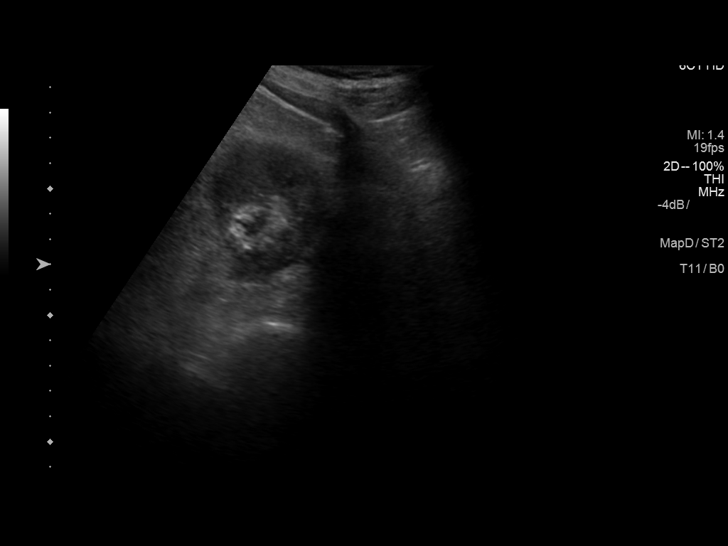
[im 31/34]
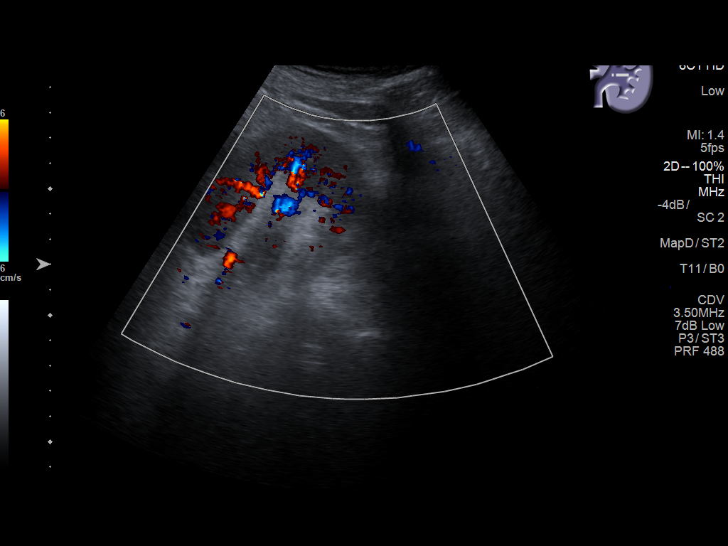
[im 34/34]
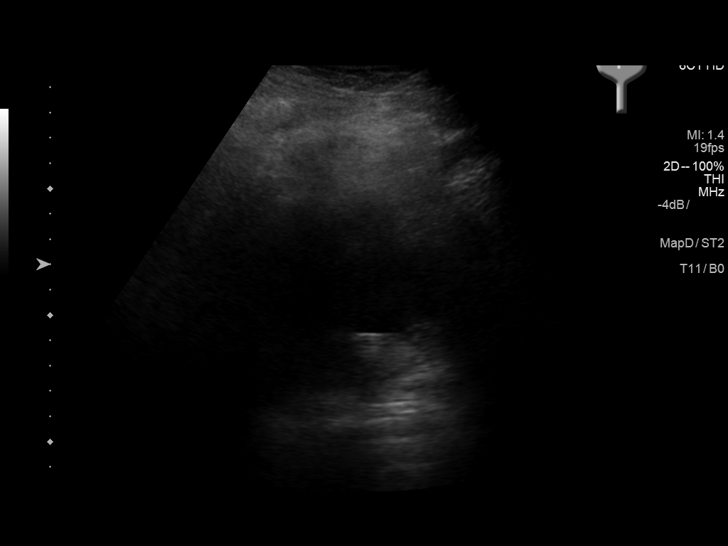

[14 of 25 positions shown; findings below may reference images not displayed]

FINDINGS: Right Kidney:

Length: 10.9 cm. Minimal upper and lower pole caliectasis. No overt
hydronephrosis.

Left Kidney:

Length: 12.0 cm. Echogenicity within normal limits. No
hydronephrosis. Upper pole 3.0 cm cyst.

Bladder:

Collapsed around a Foley catheter.
IMPRESSION: Minimal right-sided caliectasis.  No overt hydronephrosis.
